# Patient Record
Sex: Female | Born: 1985 | Race: White | Hispanic: No | Marital: Married | Smoking: Former smoker
Health system: Southern US, Community
[De-identification: ages and names within clinical notes are randomized; demographics above are authoritative.]

## PROBLEM LIST (undated history)

## (undated) ENCOUNTER — Inpatient Hospital Stay (HOSPITAL_COMMUNITY): Payer: Self-pay

## (undated) DIAGNOSIS — J302 Other seasonal allergic rhinitis: Secondary | ICD-10-CM

## (undated) DIAGNOSIS — D649 Anemia, unspecified: Secondary | ICD-10-CM

## (undated) DIAGNOSIS — T8859XA Other complications of anesthesia, initial encounter: Secondary | ICD-10-CM

## (undated) DIAGNOSIS — T4145XA Adverse effect of unspecified anesthetic, initial encounter: Secondary | ICD-10-CM

## (undated) DIAGNOSIS — F988 Other specified behavioral and emotional disorders with onset usually occurring in childhood and adolescence: Secondary | ICD-10-CM

## (undated) DIAGNOSIS — O24410 Gestational diabetes mellitus in pregnancy, diet controlled: Secondary | ICD-10-CM

## (undated) DIAGNOSIS — O24419 Gestational diabetes mellitus in pregnancy, unspecified control: Secondary | ICD-10-CM

## (undated) DIAGNOSIS — L732 Hidradenitis suppurativa: Secondary | ICD-10-CM

## (undated) DIAGNOSIS — G5603 Carpal tunnel syndrome, bilateral upper limbs: Secondary | ICD-10-CM

## (undated) DIAGNOSIS — R309 Painful micturition, unspecified: Secondary | ICD-10-CM

## (undated) DIAGNOSIS — E282 Polycystic ovarian syndrome: Secondary | ICD-10-CM

## (undated) DIAGNOSIS — R87810 Cervical high risk human papillomavirus (HPV) DNA test positive: Secondary | ICD-10-CM

## (undated) DIAGNOSIS — G709 Myoneural disorder, unspecified: Secondary | ICD-10-CM

## (undated) DIAGNOSIS — E119 Type 2 diabetes mellitus without complications: Secondary | ICD-10-CM

## (undated) DIAGNOSIS — I10 Essential (primary) hypertension: Secondary | ICD-10-CM

## (undated) HISTORY — DX: Myoneural disorder, unspecified: G70.9

## (undated) HISTORY — DX: Other specified behavioral and emotional disorders with onset usually occurring in childhood and adolescence: F98.8

## (undated) HISTORY — DX: Gestational diabetes mellitus in pregnancy, diet controlled: O24.410

## (undated) HISTORY — PX: TONSILLECTOMY AND ADENOIDECTOMY: SHX28

## (undated) HISTORY — DX: Hidradenitis suppurativa: L73.2

## (undated) HISTORY — DX: Polycystic ovarian syndrome: E28.2

## (undated) HISTORY — DX: Anemia, unspecified: D64.9

## (undated) HISTORY — PX: WISDOM TOOTH EXTRACTION: SHX21

## (undated) HISTORY — DX: Cervical high risk human papillomavirus (HPV) DNA test positive: R87.810

## (undated) HISTORY — DX: Carpal tunnel syndrome, bilateral upper limbs: G56.03

## (undated) HISTORY — DX: Painful micturition, unspecified: R30.9

---

## 2009-03-13 ENCOUNTER — Emergency Department (HOSPITAL_COMMUNITY): Admission: EM | Admit: 2009-03-13 | Discharge: 2009-03-14 | Payer: Self-pay | Admitting: Emergency Medicine

## 2011-04-10 LAB — URINALYSIS, ROUTINE W REFLEX MICROSCOPIC
Nitrite: NEGATIVE
Protein, ur: NEGATIVE mg/dL
Specific Gravity, Urine: 1.02 (ref 1.005–1.030)
Urobilinogen, UA: 0.2 mg/dL (ref 0.0–1.0)

## 2011-04-10 LAB — DIFFERENTIAL
Basophils Absolute: 0 10*3/uL (ref 0.0–0.1)
Basophils Relative: 0 % (ref 0–1)
Lymphocytes Relative: 21 % (ref 12–46)
Neutro Abs: 8.9 10*3/uL — ABNORMAL HIGH (ref 1.7–7.7)
Neutrophils Relative %: 71 % (ref 43–77)

## 2011-04-10 LAB — BASIC METABOLIC PANEL
CO2: 25 mEq/L (ref 19–32)
Calcium: 9.6 mg/dL (ref 8.4–10.5)
Creatinine, Ser: 0.88 mg/dL (ref 0.4–1.2)
GFR calc Af Amer: >60 mL/min (ref >60)
GFR calc non Af Amer: >60 mL/min (ref >60)
Glucose, Bld: 175 mg/dL — ABNORMAL HIGH (ref 70–99)
Sodium: 138 mEq/L (ref 135–145)

## 2011-04-10 LAB — CBC
MCHC: 34.3 g/dL (ref 30.0–36.0)
RBC: 3.87 MIL/uL (ref 3.87–5.11)
RDW: 12.6 % (ref 11.5–15.5)

## 2014-11-01 ENCOUNTER — Emergency Department (HOSPITAL_COMMUNITY)
Admission: EM | Admit: 2014-11-01 | Discharge: 2014-11-01 | Disposition: A | Discharge status: Home / Self Care | Payer: Self-pay | Attending: Emergency Medicine | Admitting: Emergency Medicine

## 2014-11-01 ENCOUNTER — Encounter (HOSPITAL_COMMUNITY): Payer: Self-pay | Admitting: Emergency Medicine

## 2014-11-01 DIAGNOSIS — I10 Essential (primary) hypertension: Secondary | ICD-10-CM | POA: Insufficient documentation

## 2014-11-01 DIAGNOSIS — K0889 Other specified disorders of teeth and supporting structures: Secondary | ICD-10-CM

## 2014-11-01 DIAGNOSIS — M542 Cervicalgia: Secondary | ICD-10-CM | POA: Insufficient documentation

## 2014-11-01 DIAGNOSIS — K088 Other specified disorders of teeth and supporting structures: Secondary | ICD-10-CM | POA: Insufficient documentation

## 2014-11-01 DIAGNOSIS — Z720 Tobacco use: Secondary | ICD-10-CM | POA: Insufficient documentation

## 2014-11-01 HISTORY — DX: Essential (primary) hypertension: I10

## 2014-11-01 MED ORDER — IBUPROFEN 800 MG PO TABS: 800.0000 mg | ORAL_TABLET | Freq: Three times a day (TID) | ORAL | Status: DC

## 2014-11-01 MED ORDER — HYDROCODONE-ACETAMINOPHEN 5-325 MG PO TABS: 2.0000 | ORAL_TABLET | ORAL | Status: DC | PRN

## 2014-11-01 MED ORDER — AMOXICILLIN 500 MG PO CAPS: 500.0000 mg | ORAL_CAPSULE | Freq: Three times a day (TID) | ORAL | Status: DC

## 2014-11-01 NOTE — Discharge Instructions (Signed)

## 2014-11-01 NOTE — ED Notes (Signed)
Pt reports left sided mouth pain. Airway patent. nad noted.Pt denies any new or recent injury.

## 2014-11-01 NOTE — ED Provider Notes (Signed)
CSN: [id number]     Arrival date & time 11/01/14  1812 History   First MD Initiated Contact with Patient 11/01/14 1843     Chief Complaint  Patient presents with  . Dental Pain     (Consider location/radiation/quality/duration/timing/severity/associated sxs/prior Treatment) Patient is a 28 y.o. female presenting with tooth pain. The history is provided by the patient. No language interpreter was used.  Dental Pain Location:  Upper and lower Quality:  Aching and throbbing Severity:  Moderate Onset quality:  Gradual Duration:  4 days Timing:  Constant Progression:  Worsening Chronicity:  New Context: not abscess, not dental fracture and normal dentition   Relieved by:  NSAIDs Worsened by:  Jaw movement Ineffective treatments:  NSAIDs Associated symptoms: facial pain and neck pain   Associated symptoms: no oral bleeding and no oral lesions   patient complains of pain in the left side of her jaw she points to the area of the TMJ joint down the left side of her neck and left upper maxilla area. Patient reports it hurts to eat hurts to talk. Patient reports soreness in her teeth. She has not had any dental injury she has not had any signs of abscess. Patient reports that she did have a fever for the last 2 days at no night  Past Medical History  Diagnosis Date  . Hypertension    Past Surgical History  Procedure Laterality Date  . Cesarean section    . Tonsillectomy     History reviewed. No pertinent family history. History  Substance Use Topics  . Smoking status: Current Every Day Smoker -- 0.50 packs/day  . Smokeless tobacco: Not on file  . Alcohol Use: No   OB History    No data available     Review of Systems  HENT: Negative for mouth sores.   Musculoskeletal: Positive for neck pain.  All other systems reviewed and are negative.     Allergies  Review of patient's allergies indicates not on file.  Home Medications   Prior to Admission medications   Not on  File   BP 144/93 mmHg  Pulse 94  Temp(Src) 99.3 F (37.4 C) (Oral)  Resp 20  Ht 5\' 4"  (1.626 m)  Wt 225 lb (102.059 kg)  BMI 38.60 kg/m2  SpO2 100%  LMP 10/01/2014 Physical Exam  Constitutional: She is oriented to person, place, and time. She appears well-developed and well-nourished.  HENT:  Head: Normocephalic and atraumatic.  Right Ear: External ear normal.  Left Ear: External ear normal.  Mouth/Throat: Oropharyngeal exudate present.  Dental exam shows no sign of dental infection, dental abscess or cavity. TMJ joint is diffusely tender, patient has tenderness left neck at the curve of the mandible and down the area of the lymphatic system. No lymph nodes are noted  Eyes: Pupils are equal, round, and reactive to light.  Neck: Normal range of motion. Neck supple.  Cardiovascular: Normal rate.   Pulmonary/Chest: Effort normal.  Musculoskeletal: Normal range of motion.  Neurological: She is alert and oriented to person, place, and time. She has normal reflexes.  Skin: Skin is warm.  Nursing note and vitals reviewed.   ED Course  Procedures (including critical care time) Labs Review Labs Reviewed - No data to display  Imaging Review No results found.   EKG Interpretation None      MDM   Final diagnoses:  Toothache    Patient is advised to see her dentist for dental x-ray. I am not sure if  patient's pain is coming from a tooth or possible TMJ. No signs of sinusitis. I will treat patient with hydrocodone for pain ibuprofen for inflammation and give her a prescription for amoxicillin in case of possible dental abscess. I discussed with the patient possible etiologies of her pain and dentistry follow-up.    Elson AreasLeslie K Sofia, PA-C 11/01/14 2046  Flint MelterElliott L Wentz, MD 11/01/14 [phone]

## 2015-01-28 DIAGNOSIS — G5600 Carpal tunnel syndrome, unspecified upper limb: Secondary | ICD-10-CM | POA: Insufficient documentation

## 2015-02-07 ENCOUNTER — Telehealth: Payer: Self-pay | Admitting: *Deleted

## 2015-02-07 ENCOUNTER — Ambulatory Visit (INDEPENDENT_AMBULATORY_CARE_PROVIDER_SITE_OTHER): Payer: PRIVATE HEALTH INSURANCE | Admitting: Neurology

## 2015-02-07 ENCOUNTER — Ambulatory Visit (INDEPENDENT_AMBULATORY_CARE_PROVIDER_SITE_OTHER): Payer: Self-pay | Admitting: Neurology

## 2015-02-07 DIAGNOSIS — G5602 Carpal tunnel syndrome, left upper limb: Secondary | ICD-10-CM

## 2015-02-07 DIAGNOSIS — G5603 Carpal tunnel syndrome, bilateral upper limbs: Secondary | ICD-10-CM

## 2015-02-07 DIAGNOSIS — G5601 Carpal tunnel syndrome, right upper limb: Secondary | ICD-10-CM

## 2015-02-07 NOTE — Telephone Encounter (Signed)
Patient calling stating that she is running a little behind. She should not be but about 5 or 10 mins late.

## 2015-02-07 NOTE — Procedures (Signed)
  [facility]    Provider:  Dr Lucia GaskinsAhern Referring Provider: Roma KayserSkillman, Katie, PA-C Primary Care Physician:  Donzetta SprungANIEL, TERRY, MD  HPI:  Katelyn Avila is a 29 y.o. female here as a referral from Dr. Vernice JeffersonSkillman for bilateral numbness and tingling in the hands. Symptoms are in all the fingers, right > left. Her right hand continuously hurts. Her left hand hurts when driving and on the phone. Symptoms wake her at night and she tries to rub them or shake them to alleviate the numbness and pain.   Summary: Nerve Conduction studies were performed on the bilateral upper extremities.  The left median motor nerve showed prolonged distal onset latency (5.1 ms, N<4.3) with normal F wave latency.   The right median motor nerve showed prolonged distal onset latency (5.2 ms, N<4.3) with normal F wave latency.  The left Median 2nd Digit sensory nerve showed prolonged distal peak latency (5.1 ms, N<3.6)  The right Median 2nd Digit sensory nerve showed prolonged distal peak latency (5.3 ms, N<3.6)  The bilateral ADM Ulnar motor conductions were within normal limits with normal F wave latencies.  The bilateral 5th-digit Ulnar sensory conductions were within normal limits.   EMG needle evaluation was performed on selected right upper extremity muscles: The right Deltoid, right Triceps,  right Pronator Teres, right First Dorsal Interoseous, right Opponens Pollicis  muscles were normal. The EMG needle exam was terminated prematurely at patient's request due to pain.   Conclusion: This is a abnormal study. There is electrophysiologic evidence for moderately-severe right > left bilateral Carpal Tunnel Syndrome. No suggestion of cervical radiculopathy or polyneuropathy. Clinical correlation recommended.  Katelyn DeanAntonia Levonne Carreras, MD  [facility] [address] [place], [place] [id number]  Phone [phone] Fax [phone]

## 2015-02-07 NOTE — Progress Notes (Signed)
See procedure note.

## 2015-02-19 ENCOUNTER — Other Ambulatory Visit: Payer: Self-pay | Admitting: Orthopedic Surgery

## 2015-02-20 ENCOUNTER — Encounter (HOSPITAL_BASED_OUTPATIENT_CLINIC_OR_DEPARTMENT_OTHER): Payer: Self-pay | Admitting: *Deleted

## 2015-02-20 NOTE — Progress Notes (Signed)
   02/20/15 1138  OBSTRUCTIVE SLEEP APNEA  Have you ever been diagnosed with sleep apnea through a sleep study? No  Do you snore loudly (loud enough to be heard through closed doors)?  1  Do you often feel tired, fatigued, or sleepy during the daytime? 1  Has anyone observed you stop breathing during your sleep? 1  Do you have, or are you being treated for high blood pressure? 1  BMI more than 35 kg/m2? 1  Age over 10170 years old? 0  Gender: 0  Obstructive Sleep Apnea Score 5

## 2015-02-20 NOTE — Pre-Procedure Instructions (Signed)
[facility]; CMET done on 01/29/2015 [facility] [place], [place]

## 2015-02-22 ENCOUNTER — Ambulatory Visit (HOSPITAL_BASED_OUTPATIENT_CLINIC_OR_DEPARTMENT_OTHER): Payer: PRIVATE HEALTH INSURANCE | Admitting: Anesthesiology

## 2015-02-22 ENCOUNTER — Ambulatory Visit (HOSPITAL_BASED_OUTPATIENT_CLINIC_OR_DEPARTMENT_OTHER)
Admission: RE | Admit: 2015-02-22 | Discharge: 2015-02-22 | Disposition: A | Discharge status: Home / Self Care | Payer: PRIVATE HEALTH INSURANCE | Source: Ambulatory Visit | Attending: Orthopedic Surgery | Admitting: Orthopedic Surgery

## 2015-02-22 ENCOUNTER — Encounter (HOSPITAL_BASED_OUTPATIENT_CLINIC_OR_DEPARTMENT_OTHER)
Admission: RE | Disposition: A | Discharge status: Home / Self Care | Payer: Self-pay | Source: Ambulatory Visit | Attending: Orthopedic Surgery

## 2015-02-22 ENCOUNTER — Encounter (HOSPITAL_BASED_OUTPATIENT_CLINIC_OR_DEPARTMENT_OTHER): Payer: Self-pay | Admitting: *Deleted

## 2015-02-22 DIAGNOSIS — G5601 Carpal tunnel syndrome, right upper limb: Secondary | ICD-10-CM | POA: Insufficient documentation

## 2015-02-22 DIAGNOSIS — F17200 Nicotine dependence, unspecified, uncomplicated: Secondary | ICD-10-CM | POA: Diagnosis not present

## 2015-02-22 DIAGNOSIS — Z6841 Body Mass Index (BMI) 40.0 and over, adult: Secondary | ICD-10-CM | POA: Insufficient documentation

## 2015-02-22 DIAGNOSIS — I10 Essential (primary) hypertension: Secondary | ICD-10-CM | POA: Insufficient documentation

## 2015-02-22 HISTORY — DX: Other complications of anesthesia, initial encounter: T88.59XA

## 2015-02-22 HISTORY — DX: Other seasonal allergic rhinitis: J30.2

## 2015-02-22 HISTORY — DX: Adverse effect of unspecified anesthetic, initial encounter: T41.45XA

## 2015-02-22 HISTORY — PX: CARPAL TUNNEL RELEASE: SHX101

## 2015-02-22 SURGERY — CARPAL TUNNEL RELEASE
Anesthesia: General | Site: Wrist | Laterality: Right

## 2015-02-22 MED ORDER — CEFAZOLIN SODIUM-DEXTROSE 2-3 GM-% IV SOLR
INTRAVENOUS | Status: AC
  Filled 2015-02-22: qty 50

## 2015-02-22 MED ORDER — ONDANSETRON HCL 4 MG/2ML IJ SOLN
INTRAMUSCULAR | Status: DC | PRN
  Administered 2015-02-22: 4 mg via INTRAVENOUS

## 2015-02-22 MED ORDER — FENTANYL CITRATE 0.05 MG/ML IJ SOLN
25.0000 ug | INTRAMUSCULAR | Status: DC | PRN
  Administered 2015-02-22 (×2): 50 ug via INTRAVENOUS

## 2015-02-22 MED ORDER — BUPIVACAINE HCL (PF) 0.25 % IJ SOLN
INTRAMUSCULAR | Status: DC | PRN
  Administered 2015-02-22: 10 mL

## 2015-02-22 MED ORDER — KETOROLAC TROMETHAMINE 30 MG/ML IJ SOLN: 30.0000 mg | Freq: Once | INTRAMUSCULAR | Status: DC | PRN

## 2015-02-22 MED ORDER — FENTANYL CITRATE 0.05 MG/ML IJ SOLN: 50.0000 ug | INTRAMUSCULAR | Status: DC | PRN

## 2015-02-22 MED ORDER — MIDAZOLAM HCL 5 MG/5ML IJ SOLN
INTRAMUSCULAR | Status: DC | PRN
  Administered 2015-02-22: 2 mg via INTRAVENOUS

## 2015-02-22 MED ORDER — DEXAMETHASONE SODIUM PHOSPHATE 4 MG/ML IJ SOLN
INTRAMUSCULAR | Status: DC | PRN
  Administered 2015-02-22: 10 mg via INTRAVENOUS

## 2015-02-22 MED ORDER — FENTANYL CITRATE 0.05 MG/ML IJ SOLN
INTRAMUSCULAR | Status: DC | PRN
  Administered 2015-02-22: 100 ug via INTRAVENOUS

## 2015-02-22 MED ORDER — KETOROLAC TROMETHAMINE 30 MG/ML IJ SOLN
INTRAMUSCULAR | Status: DC | PRN
  Administered 2015-02-22: 30 mg via INTRAVENOUS

## 2015-02-22 MED ORDER — FENTANYL CITRATE 0.05 MG/ML IJ SOLN
INTRAMUSCULAR | Status: AC
  Filled 2015-02-22: qty 2

## 2015-02-22 MED ORDER — CHLORHEXIDINE GLUCONATE 4 % EX LIQD: 60.0000 mL | Freq: Once | CUTANEOUS | Status: DC

## 2015-02-22 MED ORDER — PROMETHAZINE HCL 25 MG/ML IJ SOLN: 6.2500 mg | INTRAMUSCULAR | Status: DC | PRN

## 2015-02-22 MED ORDER — MIDAZOLAM HCL 2 MG/2ML IJ SOLN: 1.0000 mg | INTRAMUSCULAR | Status: DC | PRN

## 2015-02-22 MED ORDER — OXYCODONE-ACETAMINOPHEN 5-325 MG PO TABS: ORAL_TABLET | ORAL | Status: DC

## 2015-02-22 MED ORDER — LACTATED RINGERS IV SOLN
INTRAVENOUS | Status: DC
Start: 2015-02-22 — End: 2015-02-22
  Administered 2015-02-22: 10:00:00 via INTRAVENOUS

## 2015-02-22 MED ORDER — FENTANYL CITRATE 0.05 MG/ML IJ SOLN
INTRAMUSCULAR | Status: AC
  Filled 2015-02-22: qty 4

## 2015-02-22 MED ORDER — MIDAZOLAM HCL 2 MG/2ML IJ SOLN
INTRAMUSCULAR | Status: AC
  Filled 2015-02-22: qty 2

## 2015-02-22 MED ORDER — CEFAZOLIN SODIUM-DEXTROSE 2-3 GM-% IV SOLR
2.0000 g | INTRAVENOUS | Status: AC
  Administered 2015-02-22: 2 g via INTRAVENOUS

## 2015-02-22 MED ORDER — MIDAZOLAM HCL 2 MG/ML PO SYRP: 12.0000 mg | ORAL_SOLUTION | Freq: Once | ORAL | Status: DC | PRN

## 2015-02-22 MED ORDER — PROPOFOL 10 MG/ML IV BOLUS
INTRAVENOUS | Status: DC | PRN
  Administered 2015-02-22: 200 mg via INTRAVENOUS

## 2015-02-22 SURGICAL SUPPLY — 40 items
BANDAGE ELASTIC 3 VELCRO ST LF (GAUZE/BANDAGES/DRESSINGS) ×3 IMPLANT
BLADE MINI RND TIP GREEN BEAV (BLADE) IMPLANT
BLADE SURG 15 STRL LF DISP TIS (BLADE) ×2 IMPLANT
BLADE SURG 15 STRL SS (BLADE) ×4
BNDG ESMARK 4X9 LF (GAUZE/BANDAGES/DRESSINGS) ×3 IMPLANT
BNDG GAUZE ELAST 4 BULKY (GAUZE/BANDAGES/DRESSINGS) ×3 IMPLANT
CHLORAPREP W/TINT 26ML (MISCELLANEOUS) ×3 IMPLANT
CORDS BIPOLAR (ELECTRODE) ×3 IMPLANT
COVER BACK TABLE 60X90IN (DRAPES) ×3 IMPLANT
COVER MAYO STAND STRL (DRAPES) ×3 IMPLANT
CUFF TOURNIQUET SINGLE 18IN (TOURNIQUET CUFF) ×3 IMPLANT
DRAPE EXTREMITY T 121X128X90 (DRAPE) ×3 IMPLANT
DRAPE SURG 17X23 STRL (DRAPES) ×3 IMPLANT
DRSG PAD ABDOMINAL 8X10 ST (GAUZE/BANDAGES/DRESSINGS) ×3 IMPLANT
GAUZE SPONGE 4X4 12PLY STRL (GAUZE/BANDAGES/DRESSINGS) ×3 IMPLANT
GAUZE XEROFORM 1X8 LF (GAUZE/BANDAGES/DRESSINGS) ×3 IMPLANT
GLOVE BIO SURGEON STRL SZ7.5 (GLOVE) ×3 IMPLANT
GLOVE BIOGEL PI IND STRL 6.5 (GLOVE) ×1 IMPLANT
GLOVE BIOGEL PI IND STRL 7.0 (GLOVE) ×1 IMPLANT
GLOVE BIOGEL PI IND STRL 8 (GLOVE) ×1 IMPLANT
GLOVE BIOGEL PI INDICATOR 6.5 (GLOVE) ×2
GLOVE BIOGEL PI INDICATOR 7.0 (GLOVE) ×2
GLOVE BIOGEL PI INDICATOR 8 (GLOVE) ×2
GLOVE EXAM NITRILE LRG STRL (GLOVE) ×3 IMPLANT
GLOVE SURG SS PI 6.5 STRL IVOR (GLOVE) ×3 IMPLANT
GLOVE SURG SS PI 7.0 STRL IVOR (GLOVE) ×3 IMPLANT
GOWN STRL REUS W/ TWL LRG LVL3 (GOWN DISPOSABLE) ×2 IMPLANT
GOWN STRL REUS W/TWL LRG LVL3 (GOWN DISPOSABLE) ×4
GOWN STRL REUS W/TWL XL LVL3 (GOWN DISPOSABLE) ×3 IMPLANT
NEEDLE HYPO 25X1 1.5 SAFETY (NEEDLE) ×3 IMPLANT
NS IRRIG 1000ML POUR BTL (IV SOLUTION) ×3 IMPLANT
PACK BASIN DAY SURGERY FS (CUSTOM PROCEDURE TRAY) ×3 IMPLANT
PADDING CAST ABS 4INX4YD NS (CAST SUPPLIES)
PADDING CAST ABS COTTON 4X4 ST (CAST SUPPLIES) IMPLANT
STOCKINETTE 4X48 STRL (DRAPES) ×3 IMPLANT
SUT ETHILON 4 0 PS 2 18 (SUTURE) ×3 IMPLANT
SYR BULB 3OZ (MISCELLANEOUS) ×3 IMPLANT
SYR CONTROL 10ML LL (SYRINGE) ×3 IMPLANT
TOWEL OR 17X24 6PK STRL BLUE (TOWEL DISPOSABLE) ×3 IMPLANT
UNDERPAD 30X30 INCONTINENT (UNDERPADS AND DIAPERS) IMPLANT

## 2015-02-22 NOTE — Anesthesia Procedure Notes (Signed)
Procedure Name: LMA Insertion Performed by: York GricePEARSON, Katelyn Avila Pre-anesthesia Checklist: Patient identified, Emergency Drugs available, Suction available and Patient being monitored Patient Re-evaluated:Patient Re-evaluated prior to inductionOxygen Delivery Method: Circle System Utilized Preoxygenation: Pre-oxygenation with 100% oxygen Intubation Type: IV induction Ventilation: Mask ventilation without difficulty LMA: LMA with gastric port inserted LMA Size: 4.0 Number of attempts: 1 Airway Equipment and Method: Bite block Placement Confirmation: positive ETCO2 Tube secured with: Tape Dental Injury: Teeth and Oropharynx as per pre-operative assessment

## 2015-02-22 NOTE — Transfer of Care (Signed)
Immediate Anesthesia Transfer of Care Note  Patient: Katelyn Avila  Procedure(s) Performed: Procedure(s): RIGHT CARPAL TUNNEL RELEASE (Right)  Patient Location: PACU  Anesthesia Type:General  Level of Consciousness: awake, alert  and oriented  Airway & Oxygen Therapy: Patient Spontanous Breathing and Patient connected to face mask oxygen  Post-op Assessment: Report given to RN and Post -op Vital signs reviewed and stable  Post vital signs: Reviewed and stable  Last Vitals:  Filed Vitals:   02/22/15 0956  BP: 115/82  Pulse: 79  Temp: 36.9 C  Resp: 20    Complications: No apparent anesthesia complications

## 2015-02-22 NOTE — Brief Op Note (Signed)
02/22/2015  11:48 AM  PATIENT:  Katelyn Avila  29 y.o. female  PRE-OPERATIVE DIAGNOSIS:  RIGHT CARPAL TUNNEL SYNDROME  POST-OPERATIVE DIAGNOSIS:  RIGHT CARPAL TUNNEL SYNDROME  PROCEDURE:  Procedure(s): RIGHT CARPAL TUNNEL RELEASE (Right)  SURGEON:  Surgeon(s) and Role:    * Betha LoaKevin Rafiq Bucklin, MD - Primary  PHYSICIAN ASSISTANT:   ASSISTANTS: none   ANESTHESIA:   general  EBL:  Total I/O In: 800 [I.V.:800] Out: -   BLOOD ADMINISTERED:none  DRAINS: none   LOCAL MEDICATIONS USED:  MARCAINE     SPECIMEN:  No Specimen  DISPOSITION OF SPECIMEN:  N/A  COUNTS:  YES  TOURNIQUET:   Total Tourniquet Time Documented: Upper Arm (Right) - 15 minutes Total: Upper Arm (Right) - 15 minutes   DICTATION: .Other Dictation: Dictation Number [id number]  PLAN OF CARE: Discharge to home after PACU  PATIENT DISPOSITION:  PACU - hemodynamically stable.

## 2015-02-22 NOTE — Op Note (Signed)
[id number] 

## 2015-02-22 NOTE — Anesthesia Postprocedure Evaluation (Signed)
  Anesthesia Post-op Note  Patient: Katelyn Avila  Procedure(s) Performed: Procedure(s) (LRB): RIGHT CARPAL TUNNEL RELEASE (Right)  Patient Location: PACU  Anesthesia Type: General  Level of Consciousness: awake and alert   Airway and Oxygen Therapy: Patient Spontanous Breathing  Post-op Pain: mild  Post-op Assessment: Post-op Vital signs reviewed, Patient's Cardiovascular Status Stable, Respiratory Function Stable, Patent Airway and No signs of Nausea or vomiting  Last Vitals:  Filed Vitals:   02/22/15 1245  BP: 99/57  Pulse: 68  Temp:   Resp: 17    Post-op Vital Signs: stable   Complications: No apparent anesthesia complications

## 2015-02-22 NOTE — Discharge Instructions (Addendum)

## 2015-02-22 NOTE — Anesthesia Preprocedure Evaluation (Signed)
Anesthesia Evaluation  Patient identified by MRN, date of birth, ID band Patient awake    Reviewed: Allergy & Precautions, NPO status , Patient's Chart, lab work & pertinent test results  Airway Mallampati: II  TM Distance: >3 FB Neck ROM: Full    Dental no notable dental hx.    Pulmonary Current Smoker,  breath sounds clear to auscultation  Pulmonary exam normal + decreased breath sounds      Cardiovascular hypertension, Rhythm:Regular Rate:Normal     Neuro/Psych negative neurological ROS  negative psych ROS   GI/Hepatic negative GI ROS, Neg liver ROS,   Endo/Other  Morbid obesity  Renal/GU negative Renal ROS  negative genitourinary   Musculoskeletal negative musculoskeletal ROS (+)   Abdominal (+) + obese,   Peds negative pediatric ROS (+)  Hematology negative hematology ROS (+)   Anesthesia Other Findings   Reproductive/Obstetrics negative OB ROS                             Anesthesia Physical Anesthesia Plan  ASA: III  Anesthesia Plan: General   Post-op Pain Management:    Induction: Intravenous  Airway Management Planned: LMA  Additional Equipment:   Intra-op Plan:   Post-operative Plan: Extubation in OR  Informed Consent: I have reviewed the patients History and Physical, chart, labs and discussed the procedure including the risks, benefits and alternatives for the proposed anesthesia with the patient or authorized representative who has indicated his/her understanding and acceptance.   Dental advisory given  Plan Discussed with: CRNA and Surgeon  Anesthesia Plan Comments:         Anesthesia Quick Evaluation

## 2015-02-22 NOTE — H&P (Signed)
  Katelyn Avila is an 29 y.o. female.   Chief Complaint: right carpal tunnel syndrome HPI: 29 yo rhd female with tingling and numbness bilateral hands x 5 years or more.  Nocturnal symptoms nightly that wake her.  Positive nerve conduction studies.  She wishes to have a carpal tunnel release for management of symptoms.  Past Medical History  Diagnosis Date  . Hypertension     under control "most of the time"; has been on med. since 09/2014  . Carpal tunnel syndrome 01/2015    right  . Complication of anesthesia     states woke up during T & A  . Seasonal allergies     Past Surgical History  Procedure Laterality Date  . Cesarean section    . Wisdom tooth extraction    . Tonsillectomy and adenoidectomy      History reviewed. No pertinent family history. Social History:  reports that she has been smoking Cigarettes.  She has a 6.5 pack-year smoking history. She has never used smokeless tobacco. She reports that she drinks alcohol. She reports that she does not use illicit drugs.  Allergies: No Known Allergies  No prescriptions prior to admission    No results found for this or any previous visit (from the past 48 hour(s)).  No results found.   A comprehensive review of systems was negative except for: Eyes: positive for contacts/glasses  Height 5\' 5"  (1.651 m), weight 104.327 kg (230 lb), last menstrual period 02/17/2015.  General appearance: alert, cooperative and appears stated age Head: Normocephalic, without obvious abnormality, atraumatic Neck: supple, symmetrical, trachea midline Resp: clear to auscultation bilaterally Cardio: regular rate and rhythm GI: non tender Extremities: intact sensation and capillary refill all digits.  +epl/fpl/io.  no wounds. Pulses: 2+ and symmetric Skin: Skin color, texture, turgor normal. No rashes or lesions Neurologic: Grossly normal Incision/Wound:  none  Assessment/Plan Right carpal tunnel syndrome.  Non operative and operative  treatment options were discussed with the patient and patient wishes to proceed with operative treatment. Risks, benefits, and alternatives of surgery were discussed and the patient agrees with the plan of care.   Meleny Tregoning R 02/22/2015, 8:53 AM

## 2015-02-23 NOTE — Op Note (Signed)
NAMDuard Larsen:  Hosterman, Affie                ACCOUNT NO.:  [id number]  MEDICAL RECORD NO.:  [id number]  LOCATION:                                 FACILITY:  PHYSICIAN:  Betha LoaKevin Denay Pleitez, MD        DATE OF BIRTH:  [birth year]  DATE OF PROCEDURE:  02/22/2015 DATE OF DISCHARGE:                              OPERATIVE REPORT   PREOPERATIVE DIAGNOSIS:  Right carpal tunnel syndrome.  POSTOPERATIVE DIAGNOSIS:  Right carpal tunnel syndrome.  PROCEDURE:  Right carpal tunnel release.  SURGEON:  Betha LoaKevin Metha Kolasa, MD  ASSISTANT:  None.  ANESTHESIA:  General.  IV FLUIDS:  Per anesthesia flow sheet.  ESTIMATED BLOOD LOSS:  Minimal.  COMPLICATIONS:  None.  SPECIMENS:  None.  TOURNIQUET TIME:  13 minutes.  DISPOSITION:  Stable to PACU.  INDICATIONS:  Ms. Katelyn Avila is a 29 year old female who has had numbness and tingling in the digits.  This wakes her at night.  She has positive nerve conduction studies.  She wishes to have a right carpal tunnel release for management of her symptoms.  Risks, benefits, and alternatives of surgery were discussed including risk of blood loss, infection, damage to nerves, vessels, tendons, ligaments, bone; failure of surgery; need for additional surgery, complications with wound healing, continued pain, recurrence of carpal tunnel syndrome, and damage to motor branch.  She voiced understanding of these risks and elected to proceed.  OPERATIVE COURSE:  After being identified preoperatively by myself, the patient and I agreed upon procedure and site of procedure.  Surgical site was marked.  Risks, benefits, and alternatives of surgery were reviewed and she wished to proceed.  Surgical consent had been signed. She was given IV Ancef as preoperative antibiotic prophylaxis.  She was transported to the operating room and placed on the operative table in supine position with the right upper extremity on arm board.  General anesthesia was induced by anesthesiologist.  The right  upper extremity was prepped and draped in normal sterile orthopedic fashion.  Surgical pause was performed between surgeons, anesthesia, and operating room staff, and all were in agreement as to the patient, procedure, and site of procedure.  Tourniquet at the proximal aspect of the extremity was inflated to 250 mmHg after exsanguination of the limb with an Esmarch bandage.  An incision was made over the transverse carpal ligament and carried into subcutaneous tissues by spreading technique.  Bipolar electrocautery was used to obtain hemostasis.  The palmar fascia was incised sharply.  The transverse carpal ligament was identified and incised.  It was incised distally first.  Care was taken to ensure complete decompression distally.  It was then incised proximally. Scissors were used to split the distal aspect of the volar antebrachial fascia.  Finger was placed into the wound to ensure complete decompression, which was the case.  The wounds were explored.  The nerve was adherent to the radial leaflet.  Motor branch was identified and was intact.  No masses were noted.  The wound was copiously irrigated with sterile saline and closed with 4-0 nylon in a horizontal mattress fashion.  It was then injected with 10 mL of 0.25% plain Marcaine to aid in postoperative  analgesia.  It was dressed with sterile Xeroform, 4x4s, and ABD, and wrapped with Kerlix and Ace bandage.  Tourniquet was deflated at 15 minutes.  Fingertips were pink with brisk capillary refill after deflation of the tourniquet.  Operative drapes were broken down.  The patient was awoken from anesthesia safely.  She was transferred back to the stretcher and taken to PACU in stable condition. I will see her back in the office in 1 week for postoperative followup. I will give her Percocet 5/325, 1-2 p.o. q.6 hours p.r.n. pain, dispensed #30.     Betha Loa, MD     KK/MEDQ  D:  02/22/2015  T:  02/23/2015  Job:  [id number]

## 2015-02-26 ENCOUNTER — Encounter (HOSPITAL_BASED_OUTPATIENT_CLINIC_OR_DEPARTMENT_OTHER): Payer: Self-pay | Admitting: Orthopedic Surgery

## 2015-02-26 LAB — POCT HEMOGLOBIN-HEMACUE: HEMOGLOBIN: 12.5 g/dL (ref 12.0–15.0)

## 2015-07-23 ENCOUNTER — Encounter: Payer: Self-pay | Admitting: Adult Health

## 2015-07-23 ENCOUNTER — Ambulatory Visit (INDEPENDENT_AMBULATORY_CARE_PROVIDER_SITE_OTHER): Payer: PRIVATE HEALTH INSURANCE | Admitting: Adult Health

## 2015-07-23 ENCOUNTER — Other Ambulatory Visit (HOSPITAL_COMMUNITY)
Admission: RE | Admit: 2015-07-23 | Discharge: 2015-07-23 | Disposition: A | Discharge status: Home / Self Care | Payer: PRIVATE HEALTH INSURANCE | Source: Ambulatory Visit | Attending: Adult Health | Admitting: Adult Health

## 2015-07-23 VITALS — BP 132/86 | HR 76 | Ht 64.0 in | Wt 249.0 lb

## 2015-07-23 DIAGNOSIS — Z01419 Encounter for gynecological examination (general) (routine) without abnormal findings: Secondary | ICD-10-CM | POA: Diagnosis not present

## 2015-07-23 DIAGNOSIS — Z113 Encounter for screening for infections with a predominantly sexual mode of transmission: Secondary | ICD-10-CM | POA: Diagnosis present

## 2015-07-23 DIAGNOSIS — R309 Painful micturition, unspecified: Secondary | ICD-10-CM | POA: Diagnosis not present

## 2015-07-23 DIAGNOSIS — R87810 Cervical high risk human papillomavirus (HPV) DNA test positive: Secondary | ICD-10-CM | POA: Diagnosis present

## 2015-07-23 DIAGNOSIS — Z1151 Encounter for screening for human papillomavirus (HPV): Secondary | ICD-10-CM | POA: Diagnosis present

## 2015-07-23 DIAGNOSIS — Z319 Encounter for procreative management, unspecified: Secondary | ICD-10-CM

## 2015-07-23 DIAGNOSIS — L732 Hidradenitis suppurativa: Secondary | ICD-10-CM | POA: Insufficient documentation

## 2015-07-23 HISTORY — DX: Painful micturition, unspecified: R30.9

## 2015-07-23 HISTORY — DX: Hidradenitis suppurativa: L73.2

## 2015-07-23 LAB — POCT URINALYSIS DIPSTICK
Blood, UA: NEGATIVE
Glucose, UA: NEGATIVE
Leukocytes, UA: NEGATIVE
Nitrite, UA: NEGATIVE
PROTEIN UA: NEGATIVE

## 2015-07-23 MED ORDER — CITRANATAL 90 DHA 90-1 & 300 MG PO MISC: ORAL | Status: DC

## 2015-07-23 NOTE — Progress Notes (Signed)
Patient ID: Katelyn Avila, female   DOB: [birth year], 29 y.o.   MRN: [id number] History of Present Illness: Katelyn Avila is a 29 year old white female, married, new to this practice, in for well woman gyn exam and pap.   Current Medications, Allergies, Past Medical History, Past Surgical History, Family History and Social History were reviewed in [employer] record.     Review of Systems: Patient denies any daily headaches, hearing loss, fatigue, blurred vision, shortness of breath, chest pain, abdominal pain, problems with bowel movements,or intercourse. No joint pain or mood swings.She has history if irregular periods and PCOs.She has 1 son but desires another child.She says she has pain and pressure with urination and sometimes cramps in"butt".    Physical Exam:BP 132/86 mmHg  Pulse 76  Ht  (1.626 m)  Wt 249 lb (112.946 kg)  BMI 42.72 kg/m2  LMP 07/15/2015 (Approximate)urine negative General:  Well developed, well nourished, no acute distress Skin:  Warm and dry Neck:  Midline trachea, normal thyroid, good ROM, no lymphadenopathy,has increased hair on chin, she shaves Lungs; Clear to auscultation bilaterally Breast:  No dominant palpable mass, retraction, or nipple discharge Cardiovascular: Regular rate and rhythm Abdomen:  Soft, non tender, no hepatosplenomegaly.obese Pelvic:  External genitalia is normal in appearance, no lesions.Does have scarring from hidradenitis.  The vagina is normal in appearance. Urethra has no lesions or masses. The cervix is nulliparous with nabothian cyst at 1 o'clock.  Uterus is felt to be normal size, shape, and contour.  No adnexal masses or tenderness noted.Bladder is non tender, no masses felt. Extremities/musculoskeletal:  No swelling or varicosities noted, no clubbing or cyanosis Psych:  No mood changes, alert and cooperative,seems happy   Impression: Well woman gyn exam with pap Desires pregnancy Pain and pressure with  urination Hidradenitis    Plan: Rx citranatal 90DHA take 1 daily #30 with 11 refills Check progesterone level 08/04/15 Discussed timing of sex, decreasing cigarettes and trying to lose some weight, decrease sodas Physical in 1 year Mammogram at 38

## 2015-07-23 NOTE — Patient Instructions (Signed)
Get progesterone level 8/6 opens at 8 am Physical in  1year Mammogram at 38 Have sex every other day, day 7-24, pee before sex and lay 30 minutes after

## 2015-07-24 LAB — CYTOLOGY - PAP

## 2015-07-25 ENCOUNTER — Telehealth: Payer: Self-pay | Admitting: Adult Health

## 2015-07-25 ENCOUNTER — Encounter: Payer: Self-pay | Admitting: Adult Health

## 2015-07-25 DIAGNOSIS — R87810 Cervical high risk human papillomavirus (HPV) DNA test positive: Secondary | ICD-10-CM | POA: Insufficient documentation

## 2015-07-25 DIAGNOSIS — Z113 Encounter for screening for infections with a predominantly sexual mode of transmission: Secondary | ICD-10-CM

## 2015-07-25 HISTORY — DX: Cervical high risk human papillomavirus (HPV) DNA test positive: R87.810

## 2015-07-25 NOTE — Telephone Encounter (Signed)
Pt aware of normal pap with negative, GC/CHL but +HPV, will repeat pap in 1 year

## 2015-07-25 NOTE — Telephone Encounter (Signed)
Pt aware of +HPV wants test for HIV.RPR and HSV 2, will order

## 2015-07-28 LAB — HIV ANTIBODY (ROUTINE TESTING W REFLEX): HIV Screen 4th Generation wRfx: NONREACTIVE

## 2015-07-28 LAB — RPR: RPR Ser Ql: NONREACTIVE

## 2015-07-28 LAB — HSV 2 ANTIBODY, IGG: HSV 2 Glycoprotein G Ab, IgG: <0.91 index (ref 0.00–0.90)

## 2015-07-28 LAB — PROGESTERONE: Progesterone: 0.6 ng/mL

## 2015-07-30 ENCOUNTER — Telehealth: Payer: Self-pay | Admitting: Adult Health

## 2015-07-30 NOTE — Telephone Encounter (Signed)
Progesterone results of 0.6, per Cyril Mourning, NP pt needs an appt to discuss progesterone results and desired pregnancy. Pt given an appt on 08/07/2015 at 4 pm with Victorino Dike.

## 2015-08-07 ENCOUNTER — Ambulatory Visit (INDEPENDENT_AMBULATORY_CARE_PROVIDER_SITE_OTHER): Payer: PRIVATE HEALTH INSURANCE | Admitting: Adult Health

## 2015-08-07 ENCOUNTER — Encounter: Payer: Self-pay | Admitting: Adult Health

## 2015-08-07 VITALS — BP 110/70 | HR 80 | Ht 64.0 in | Wt 249.0 lb

## 2015-08-07 DIAGNOSIS — Z319 Encounter for procreative management, unspecified: Secondary | ICD-10-CM

## 2015-08-07 MED ORDER — CITRANATAL ASSURE 35-1 & 300 MG PO MISC: 1.0000 | Freq: Every day | ORAL | Status: DC

## 2015-08-07 MED ORDER — CLOMIPHENE CITRATE 50 MG PO TABS: 50.0000 mg | ORAL_TABLET | Freq: Every day | ORAL | Status: DC

## 2015-08-07 NOTE — Patient Instructions (Signed)
Call with  Period, will rx comid  Clomiphene tablets What is this medicine? CLOMIPHENE (KLOE mi feen) is a fertility drug that increases the chance of pregnancy. It helps women ovulate (produce a mature egg) during their cycle. This medicine may be used for other purposes; ask your health care provider or pharmacist if you have questions. COMMON BRAND NAME(S): Clomid, Serophene What should I tell my health care provider before I take this medicine? They need to know if you have any of these conditions: -adrenal gland disease -blood vessel disease or blood clots -cyst on the ovary -endometriosis -liver disease -ovarian cancer -pituitary gland disease -vaginal bleeding that has not been evaluated -an unusual or allergic reaction to clomiphene, other medicines, foods, dyes, or preservatives -pregnant (should not be used if you are already pregnant) -breast-feeding How should I use this medicine? Take this medicine by mouth with a glass of water. Follow the directions on the prescription label. Take exactly as directed for the exact number of days prescribed. Take your doses at regular intervals. Most women take this medicine for a 5 day period, but the length of treatment may be adjusted. Your doctor will give you a start date for this medication and will give you instructions on proper use. Do not take your medicine more often than directed. Talk to your pediatrician regarding the use of this medicine in children. Special care may be needed. Overdosage: If you think you have taken too much of this medicine contact a poison control center or emergency room at once. NOTE: This medicine is only for you. Do not share this medicine with others. What if I miss a dose? If you miss a dose, take it as soon as you can. If it is almost time for your next dose, take only that dose. Do not take double or extra doses. What may interact with this medicine? -herbal or dietary supplements, like blue cohosh,  black cohosh, chasteberry, or DHEA -prasterone This list may not describe all possible interactions. Give your health care provider a list of all the medicines, herbs, non-prescription drugs, or dietary supplements you use. Also tell them if you smoke, drink alcohol, or use illegal drugs. Some items may interact with your medicine. What should I watch for while using this medicine? Make sure you understand how and when to use this medicine. You need to know when you are ovulating and when to have sexual intercourse. This will increase the chance of a pregnancy. Visit your doctor or health care professional for regular checks on your progress. You may need tests to check the hormone levels in your blood or you may have to use home-urine tests to check for ovulation. Try to keep any appointments. Compared to other fertility treatments, this medicine does not greatly increase your chances of having multiple babies. An increased chance of having twins may occur in roughly 5 out of every 100 women who take this medication. Stop taking this medicine at once and contact your doctor or health care professional if you think you are pregnant. This medicine is not for long-term use. Most women that benefit from this medicine do so within the first three cycles (months). Your doctor or health care professional will monitor your condition. This medicine is usually used for a total of 6 cycles of treatment. You may get drowsy or dizzy. Do not drive, use machinery, or do anything that needs mental alertness until you know how this drug affects you. Do not stand or sit up  quickly. This reduces the risk of dizzy or fainting spells. Drinking alcoholic beverages or smoking tobacco may decrease your chance of becoming pregnant. Limit or stop alcohol and tobacco use during your fertility treatments. What side effects may I notice from receiving this medicine? Side effects that you should report to your doctor or health care  professional as soon as possible: -allergic reactions like skin rash, itching or hives, swelling of the face, lips, or tongue -breathing problems -changes in vision -fluid retention -nausea, vomiting -pelvic pain or bloating -severe abdominal pain -sudden weight gain Side effects that usually do not require medical attention (report to your doctor or health care professional if they continue or are bothersome): -breast discomfort -hot flashes -mild pelvic discomfort -mild nausea This list may not describe all possible side effects. Call your doctor for medical advice about side effects. You may report side effects to FDA at 1-800-FDA-1088. Where should I keep my medicine? Keep out of the reach of children. Store at room temperature between 15 and 30 degrees C (59 and 86 degrees F). Protect from heat, light, and moisture. Throw away any unused medicine after the expiration date. NOTE: This sheet is a summary. It may not cover all possible information. If you have questions about this medicine, talk to your doctor, pharmacist, or health care provider.  2015, Elsevier/Gold Standard. (2008-03-27 22:21:06) CLOMID INSTRUCTIONS  WHY USE IT? Clomid helps your ovaries to release eggs (ovulate).  HOW TO USE IT? Clomid is taken as a pill usually on days 5,6,7,8, & 9 of your cycle.  Day 1 is the first day of your period. The dose or duration may be changed to achieve ovulation.  Provera (progesterone) may first be used to bring on a period for some patients. The day of ovulation on Clomid is usually between cycle day 14 and 17.  Having sexual intercourse at least every other day between cycle day 13 and 18 will improve your chances of becoming pregnant during the Clomid cycle.  You may monitor your ovulation using basal body temperature charts or with ovulation kits.  If using the ovulation predictor kits, having intercourse the day of the surge and the two days following is recommended. If you get  your period, call when it starts for an appointment with your doctor, so that an exam may be done, and another Clomid cycle can be considered if appropriate. If you do not get a period by day 35 of the cycle, please get a blood pregnancy test.  If it is negative, speak to your doctor for instructions to bring on another period and to plan a follow-up appointment.  THINGS TO KNOW: If you get pregnant while using Clomid, your chance of twins is 7%m and triplets is less than 1%. Some studies have suggested the use of "fertility drugs" may increase your risk of ovarian cancers in the future.  It is unclear if these drugs increase the risk, or people who have problems with fertility are prone for these cancers.  If there is an actual risk, it is very low.  If you have a history of liver problems or ovarian cancer, it may be wise to avoid this medication.  SIDE EFFECTS:  The most common side effect is hot flashes (20%).  Breast tenderness, headaches, nausea, bloating may also occur at different times.  Less than 3/1,000 people have dryness or loss of hair.  Persistent ovarian cysts may form from the use of this medication.  Ovarian hyperstimulation syndrome is a  rare side effect at low doses.  Visual changes like flashes of light or blurring.

## 2015-08-07 NOTE — Progress Notes (Signed)
Subjective:     Patient ID: Katelyn Avila, female   DOB: 1986-03-31, 29 y.o.   MRN: [id number]  HPI [name] is a 29 year old white female in to discuss taking clomid, she has history of PCO and is on metformin and aldactone. She has 66 yo son from prior relationship and her husband has 62 yo daughter from prion relationship.  Review of Systems Patient denies any headaches, hearing loss, fatigue, blurred vision, shortness of breath, chest pain, abdominal pain, problems with bowel movements, urination, or intercourse. No joint pain or mood swings. Reviewed past medical,surgical, social and family history. Reviewed medications and allergies.     Objective:   Physical Exam BP 110/70 mmHg  Pulse 80  Ht  (1.626 m)  Wt 249 lb (112.946 kg)  BMI 42.72 kg/m2  LMP 07/15/2015 (Approximate)Pt had progesterone level of 0.6 on 7/29 and she wants to try clomid, discussed with her and her husband, using clomid and they want to try it.    Assessment:     Desires pregnancy    Plan:     Rx clomid 50 mg #5 take 1 days 3-7 of cycle Call with next period, will check progesterone day 21 Rx citra natal assure #60 take daily with 11 refills Review handout on clomid

## 2015-08-20 ENCOUNTER — Other Ambulatory Visit: Payer: Self-pay | Admitting: Adult Health

## 2015-08-20 ENCOUNTER — Telehealth: Payer: Self-pay | Admitting: Adult Health

## 2015-08-20 DIAGNOSIS — Z319 Encounter for procreative management, unspecified: Secondary | ICD-10-CM

## 2015-08-20 NOTE — Telephone Encounter (Signed)
Period started yesterday, start clomid in am check progesterone 9/10

## 2015-09-09 LAB — PROGESTERONE: PROGESTERONE: 1.3 ng/mL

## 2015-09-10 ENCOUNTER — Telehealth: Payer: Self-pay | Admitting: Adult Health

## 2015-09-10 MED ORDER — CLOMIPHENE CITRATE 50 MG PO TABS: 100.0000 mg | ORAL_TABLET | Freq: Every day | ORAL | Status: DC

## 2015-09-10 NOTE — Telephone Encounter (Signed)
Pt aware of progesterone level, will increase clomid to 100 mg to try with next cycle, call with period, so can check progesterone level day 21

## 2015-09-14 ENCOUNTER — Telehealth: Payer: Self-pay | Admitting: Adult Health

## 2015-09-14 DIAGNOSIS — Z319 Encounter for procreative management, unspecified: Secondary | ICD-10-CM

## 2015-09-14 NOTE — Telephone Encounter (Signed)
Pt started period today, ck progesterone level 10/6

## 2015-09-14 NOTE — Telephone Encounter (Signed)
Spoke with pt. Pt started her period today. Wanted to let you know. JSY

## 2015-10-02 ENCOUNTER — Encounter: Payer: Self-pay | Admitting: Women's Health

## 2015-10-02 ENCOUNTER — Ambulatory Visit (INDEPENDENT_AMBULATORY_CARE_PROVIDER_SITE_OTHER): Payer: PRIVATE HEALTH INSURANCE | Admitting: Women's Health

## 2015-10-02 VITALS — BP 120/78 | HR 84 | Wt 255.0 lb

## 2015-10-02 DIAGNOSIS — R102 Pelvic and perineal pain: Secondary | ICD-10-CM

## 2015-10-02 DIAGNOSIS — E669 Obesity, unspecified: Secondary | ICD-10-CM

## 2015-10-02 DIAGNOSIS — N949 Unspecified condition associated with female genital organs and menstrual cycle: Secondary | ICD-10-CM

## 2015-10-02 DIAGNOSIS — Z3202 Encounter for pregnancy test, result negative: Secondary | ICD-10-CM | POA: Diagnosis not present

## 2015-10-02 HISTORY — DX: Obesity, unspecified: E66.9

## 2015-10-02 LAB — POCT URINE PREGNANCY: Preg Test, Ur: NEGATIVE

## 2015-10-02 NOTE — Progress Notes (Signed)
Patient ID: Katelyn Avila, female   DOB: 1986/07/02, 29 y.o.   MRN: [id number]   [facility] Visit  Patient name: Katelyn Avila MRN [id number]  Date of birth: 07-28-1986  CC & HPI:  Katelyn Avila is a 29 y.o. G1P1 Caucasian female presenting today for report of really bad cramps beginning this weekend- feel like are coming up from rectum into abdomen, ibuprofen  and apap not helping. On 2nd month of clomid, LMP 9/16, supposed to have progesterone draw 10/6.   Patient's last menstrual period was 09/14/2015.   Pertinent History Reviewed:  Medical & Surgical Hx:   Past Medical History  Diagnosis Date  . Hypertension     under control "most of the time"; has been on med. since 09/2014  . Complication of anesthesia     states woke up during T & A  . Seasonal allergies   . PCOS (polycystic ovarian syndrome)   . Neuromuscular disorder (HCC)   . Pain with urination 07/23/2015  . Hidradenitis 07/23/2015  . Cervical high risk HPV (human papillomavirus) test positive 07/25/2015  . Carpal tunnel syndrome, bilateral    Past Surgical History  Procedure Laterality Date  . Cesarean section    . Wisdom tooth extraction    . Tonsillectomy and adenoidectomy    . Carpal tunnel release Right 02/22/2015    Procedure: RIGHT CARPAL TUNNEL RELEASE;  Surgeon: Betha Loa, MD;  Location: [facility];  Service: Orthopedics;  Laterality: Right;   Medications: Reviewed & Updated - see associated section Social History: Reviewed -  reports that she has been smoking Cigarettes.  She has a 6.5 pack-year smoking history. She has never used smokeless tobacco.  Objective Findings:  Vitals: BP 120/78 mmHg  Pulse 84  Wt 115.667 kg (255 lb)  LMP 09/14/2015 Body mass index is 43.75 kg/(m^2).  Physical Examination: General appearance - alert, well appearing, and in no distress  No results found for this or any previous visit (from the past 24 hour(s)).   Assessment & Plan:  A:    Pelvic cramping midcycle, on 2nd month of clomid, likely ovulation pain  P:  Go home & have lots of sex  Progesterone as scheduled on 10/6 (day 21 of cycle)  Ibuprofen, heating pad, warm baths as needed  Return for As scheduled.  Marge Duncans CNM, [facility] 10/02/2015 4:28 PM

## 2015-10-02 NOTE — Patient Instructions (Signed)
If you are trying to get pregnant:  Have sex every other day on days 7-24 of your cycle (Day 1 is the 1st day of your period) Pee before sex Lay with your hips elevated on pillows for 20-30mins after sex Do not smoke or drink alcohol Lose weight if you are overweight Take a prenatal vitamin with at least 400mcg of folic acid Decrease stress in your life  For Him:  Wear boxers instead of briefs Avoid hot baths/jacuzzi Vit C supplement Do not smoke or drink alcohol Lose weight if you are overweight    

## 2015-10-02 NOTE — Addendum Note (Signed)
Addended by: Gaylyn Rong A on: 10/02/2015 04:46 PM   Modules accepted: Orders

## 2015-10-05 LAB — PROGESTERONE: PROGESTERONE: 10.2 ng/mL

## 2015-10-08 ENCOUNTER — Telehealth: Payer: Self-pay | Admitting: Adult Health

## 2015-10-08 NOTE — Telephone Encounter (Signed)
Pt aware progesterone level 10.2

## 2015-10-15 ENCOUNTER — Telehealth: Payer: Self-pay | Admitting: Adult Health

## 2015-10-15 NOTE — Telephone Encounter (Signed)
Pt's voicemail is not set up @ 12:29 pm. JSY

## 2015-10-16 MED ORDER — CLOMIPHENE CITRATE 50 MG PO TABS: 100.0000 mg | ORAL_TABLET | Freq: Every day | ORAL | Status: DC

## 2015-10-16 NOTE — Telephone Encounter (Signed)
Voicemail not set up @ 9:20 am. [employer]

## 2015-10-16 NOTE — Telephone Encounter (Signed)
Refilled clomid 

## 2015-10-16 NOTE — Telephone Encounter (Signed)
Spoke with pt. Pt started period Friday, she stopped bleeding Sunday, but started back bleeding yesterday and its light. She needs a refill on Clomid. Thanks! JSY

## 2015-10-22 DIAGNOSIS — I10 Essential (primary) hypertension: Secondary | ICD-10-CM | POA: Insufficient documentation

## 2016-06-04 DIAGNOSIS — G4709 Other insomnia: Secondary | ICD-10-CM | POA: Insufficient documentation

## 2016-06-04 DIAGNOSIS — R4587 Impulsiveness: Secondary | ICD-10-CM | POA: Insufficient documentation

## 2016-06-09 ENCOUNTER — Telehealth (HOSPITAL_COMMUNITY): Payer: Self-pay | Admitting: *Deleted

## 2016-06-09 NOTE — Telephone Encounter (Signed)
phone call, voice mail box not set up yet.

## 2016-07-07 DIAGNOSIS — R42 Dizziness and giddiness: Secondary | ICD-10-CM | POA: Insufficient documentation

## 2016-07-07 DIAGNOSIS — R002 Palpitations: Secondary | ICD-10-CM | POA: Insufficient documentation

## 2016-07-07 DIAGNOSIS — Z6839 Body mass index (BMI) 39.0-39.9, adult: Secondary | ICD-10-CM | POA: Insufficient documentation

## 2016-07-07 HISTORY — DX: Dizziness and giddiness: R42

## 2016-07-07 HISTORY — DX: Palpitations: R00.2

## 2016-07-11 ENCOUNTER — Encounter: Payer: Self-pay | Admitting: *Deleted

## 2016-07-11 ENCOUNTER — Telehealth: Payer: Self-pay | Admitting: *Deleted

## 2016-07-11 NOTE — Telephone Encounter (Signed)
Spoke with the pt and advised either flintstone vitamins or OTC prenatal vitamin would be fine. Pt verbalized understanding.

## 2016-07-14 ENCOUNTER — Ambulatory Visit: Payer: PRIVATE HEALTH INSURANCE | Admitting: Cardiovascular Disease

## 2016-07-23 ENCOUNTER — Other Ambulatory Visit: Payer: PRIVATE HEALTH INSURANCE | Admitting: Adult Health

## 2016-07-28 ENCOUNTER — Telehealth: Payer: Self-pay | Admitting: Obstetrics & Gynecology

## 2016-07-28 NOTE — Telephone Encounter (Signed)
Pt called stating that she would like a doctor to call in something for her nausea, Pt states that she is also experiencing headaches. Pt has a Dating u/s on 07/31/2016. Please contact pt

## 2016-07-29 ENCOUNTER — Ambulatory Visit: Payer: PRIVATE HEALTH INSURANCE | Admitting: Obstetrics and Gynecology

## 2016-07-29 ENCOUNTER — Telehealth: Payer: Self-pay | Admitting: Women's Health

## 2016-07-29 MED ORDER — DOXYLAMINE-PYRIDOXINE 10-10 MG PO TBEC: 10.0000 mg | DELAYED_RELEASE_TABLET | ORAL | 0 refills | Status: DC

## 2016-07-29 NOTE — Telephone Encounter (Addendum)
Pt states initial ultrasound appt on 07/31/2016, c/o dizziness, nausea and vomiting,  Headaches x 3 days( no improvement with Tylenol). Headaches so bad she has to go to bed. Pt states she has been trying to eat and drink to help with dizziness.  Pt states takes blood pressure medication, Lisinopril. Concerned about blood pressure. Pt states had to be induced her last pregnancy due to elevated blood pressures.   Pt given an appt today for evaluation.   Pt called back and states she was able to check her blood pressure 130/77, P 90.  Pt also states does not feel she is going to be able to make it to the appt today. Pt encouraged to keep hydrated and eat frequent snacks/meals. Continue to monitor blood pressure.  Diclegis samples x 2 left at front desk for nausea/vomiting. Pt to keep her appt for 07/31/2016 for ultrasound.

## 2016-07-29 NOTE — Addendum Note (Signed)
Addended by: Criss Alvine on: 07/29/2016 10:14 AM   Modules accepted: Orders

## 2016-07-30 ENCOUNTER — Other Ambulatory Visit: Payer: Self-pay | Admitting: Obstetrics and Gynecology

## 2016-07-30 DIAGNOSIS — O3680X0 Pregnancy with inconclusive fetal viability, not applicable or unspecified: Secondary | ICD-10-CM

## 2016-07-31 ENCOUNTER — Ambulatory Visit (INDEPENDENT_AMBULATORY_CARE_PROVIDER_SITE_OTHER): Payer: PRIVATE HEALTH INSURANCE

## 2016-07-31 DIAGNOSIS — Z3A01 Less than 8 weeks gestation of pregnancy: Secondary | ICD-10-CM | POA: Diagnosis not present

## 2016-07-31 DIAGNOSIS — O3680X0 Pregnancy with inconclusive fetal viability, not applicable or unspecified: Secondary | ICD-10-CM | POA: Diagnosis not present

## 2016-07-31 NOTE — Progress Notes (Signed)
[place] 6+3 wks,single IUP w/ys,pos.fht 127 bpm,normal ov's bilat,crl 7.0 mm

## 2016-08-08 ENCOUNTER — Telehealth: Payer: Self-pay | Admitting: Advanced Practice Midwife

## 2016-08-11 MED ORDER — DOXYLAMINE-PYRIDOXINE 10-10 MG PO TBEC: DELAYED_RELEASE_TABLET | ORAL | 6 refills | Status: DC

## 2016-08-11 NOTE — Telephone Encounter (Signed)
Pt requesting Rx for Diclegis, Zofran not helping.  Pt states her insurance is [employer].

## 2016-08-12 ENCOUNTER — Other Ambulatory Visit (HOSPITAL_COMMUNITY)
Admission: RE | Admit: 2016-08-12 | Discharge: 2016-08-12 | Disposition: A | Discharge status: Home / Self Care | Payer: PRIVATE HEALTH INSURANCE | Source: Ambulatory Visit | Attending: Obstetrics & Gynecology | Admitting: Obstetrics & Gynecology

## 2016-08-12 ENCOUNTER — Encounter: Payer: Self-pay | Admitting: Women's Health

## 2016-08-12 ENCOUNTER — Ambulatory Visit (INDEPENDENT_AMBULATORY_CARE_PROVIDER_SITE_OTHER): Payer: PRIVATE HEALTH INSURANCE | Admitting: Women's Health

## 2016-08-12 VITALS — BP 138/78 | HR 76 | Wt 260.0 lb

## 2016-08-12 DIAGNOSIS — Z1389 Encounter for screening for other disorder: Secondary | ICD-10-CM

## 2016-08-12 DIAGNOSIS — Z113 Encounter for screening for infections with a predominantly sexual mode of transmission: Secondary | ICD-10-CM | POA: Insufficient documentation

## 2016-08-12 DIAGNOSIS — O0990 Supervision of high risk pregnancy, unspecified, unspecified trimester: Secondary | ICD-10-CM | POA: Insufficient documentation

## 2016-08-12 DIAGNOSIS — Z3A09 9 weeks gestation of pregnancy: Secondary | ICD-10-CM

## 2016-08-12 DIAGNOSIS — Z8632 Personal history of gestational diabetes: Secondary | ICD-10-CM

## 2016-08-12 DIAGNOSIS — Z98891 History of uterine scar from previous surgery: Secondary | ICD-10-CM | POA: Insufficient documentation

## 2016-08-12 DIAGNOSIS — O10912 Unspecified pre-existing hypertension complicating pregnancy, second trimester: Secondary | ICD-10-CM

## 2016-08-12 DIAGNOSIS — O09291 Supervision of pregnancy with other poor reproductive or obstetric history, first trimester: Secondary | ICD-10-CM

## 2016-08-12 DIAGNOSIS — O10919 Unspecified pre-existing hypertension complicating pregnancy, unspecified trimester: Secondary | ICD-10-CM

## 2016-08-12 DIAGNOSIS — Z124 Encounter for screening for malignant neoplasm of cervix: Secondary | ICD-10-CM

## 2016-08-12 DIAGNOSIS — O09299 Supervision of pregnancy with other poor reproductive or obstetric history, unspecified trimester: Secondary | ICD-10-CM | POA: Insufficient documentation

## 2016-08-12 DIAGNOSIS — Z369 Encounter for antenatal screening, unspecified: Secondary | ICD-10-CM

## 2016-08-12 DIAGNOSIS — Z0283 Encounter for blood-alcohol and blood-drug test: Secondary | ICD-10-CM

## 2016-08-12 DIAGNOSIS — Z1151 Encounter for screening for human papillomavirus (HPV): Secondary | ICD-10-CM | POA: Diagnosis present

## 2016-08-12 DIAGNOSIS — I10 Essential (primary) hypertension: Secondary | ICD-10-CM | POA: Insufficient documentation

## 2016-08-12 DIAGNOSIS — O09891 Supervision of other high risk pregnancies, first trimester: Secondary | ICD-10-CM

## 2016-08-12 DIAGNOSIS — E282 Polycystic ovarian syndrome: Secondary | ICD-10-CM | POA: Insufficient documentation

## 2016-08-12 DIAGNOSIS — Z331 Pregnant state, incidental: Secondary | ICD-10-CM

## 2016-08-12 DIAGNOSIS — O34219 Maternal care for unspecified type scar from previous cesarean delivery: Secondary | ICD-10-CM

## 2016-08-12 DIAGNOSIS — Z01419 Encounter for gynecological examination (general) (routine) without abnormal findings: Secondary | ICD-10-CM | POA: Insufficient documentation

## 2016-08-12 DIAGNOSIS — Z3682 Encounter for antenatal screening for nuchal translucency: Secondary | ICD-10-CM

## 2016-08-12 LAB — POCT URINALYSIS DIPSTICK
Blood, UA: NEGATIVE
GLUCOSE UA: NEGATIVE
Ketones, UA: NEGATIVE
LEUKOCYTES UA: NEGATIVE
NITRITE UA: NEGATIVE
Protein, UA: NEGATIVE

## 2016-08-12 MED ORDER — LABETALOL HCL 200 MG PO TABS: 200.0000 mg | ORAL_TABLET | Freq: Two times a day (BID) | ORAL | 3 refills | Status: DC

## 2016-08-12 NOTE — Progress Notes (Signed)
Subjective:  Katelyn Avila is a 30 y.o. 702P0101 Caucasian female at 5559w1d by 6wk u/s, being seen today for her first obstetrical visit.  Her obstetrical history is significant for 36wk IOL d/t worsening CHTN- failed IOL w/ c/s @ 4cm, states they discovered she had HTN early in 1st pregnancy and has been on meds ever since- has been taking lisinopril/hctz, had A2DM-insulin during that pregnancy as well.  Pregnancy history fully reviewed.  Patient reports n/v- tried diclegis samples which helped, i sent in rx for diclegis yesterday when she called but states she was unaware of this, had called nurse line last week and was rx'd 6 pills of zofran- had taken 3. Denies vb, cramping, uti s/s, abnormal/malodorous vag d/c, or vulvovaginal itching/irritation.  BP 138/78   Pulse 76   Wt 260 lb (117.9 kg)   LMP  (LMP Unknown)   BMI 44.63 kg/m   HISTORY: OB History  Gravida Para Term Preterm AB Living  2 1   1   1   SAB TAB Ectopic Multiple Live Births          1    # Outcome Date GA Lbr Len/2nd Weight Sex Delivery Anes PTL Lv  2 Current           1 Preterm 10/19/09 3483w0d  6 lb 3 oz (2.807 kg) M CS-Unspec  N LIV     Past Medical History:  Diagnosis Date  . Carpal tunnel syndrome, bilateral   . Cervical high risk HPV (human papillomavirus) test positive 07/25/2015  . Complication of anesthesia    states woke up during T & A  . Hidradenitis 07/23/2015  . Hypertension    under control "most of the time"; has been on med. since 09/2014  . Neuromuscular disorder (HCC)   . Pain with urination 07/23/2015  . PCOS (polycystic ovarian syndrome)   . Seasonal allergies    Past Surgical History:  Procedure Laterality Date  . CARPAL TUNNEL RELEASE Right 02/22/2015   Procedure: RIGHT CARPAL TUNNEL RELEASE;  Surgeon: Betha LoaKevin Kuzma, MD;  Location: [facility];  Service: Orthopedics;  Laterality: Right;  . CESAREAN SECTION    . TONSILLECTOMY AND ADENOIDECTOMY    . WISDOM TOOTH EXTRACTION      Family History  Problem Relation Age of Onset  . Fibroids Mother   . Hypertension Mother   . Endometriosis Mother   . Hypertension Father   . Hypertension Brother   . Hypertension Maternal Grandmother   . Diabetes Maternal Grandmother   . COPD Maternal Grandmother   . Hypertension Maternal Grandfather   . Hypertension Paternal Grandmother   . Cancer Paternal Grandmother     breast  . Hypertension Paternal Grandfather     Exam   System:     General: Well developed & nourished, no acute distress   Skin: Warm & dry, normal coloration and turgor, no rashes   Neurologic: Alert & oriented, normal mood   Cardiovascular: Regular rate & rhythm   Respiratory: Effort & rate normal, LCTAB, acyanotic   Abdomen: Soft, non tender   Extremities: normal strength, tone   Pelvic Exam:    Perineum: Normal perineum   Vulva: Normal, no lesions   Vagina:  Normal mucosa, normal discharge   Cervix: Normal, bulbous, appears closed   Uterus: Normal size/shape/contour for GA   Thin prep pap smear obtained today  FHR: + via informal u/s   Assessment:   Pregnancy: G2P0101 Patient Active Problem List   Diagnosis  Date Noted  . Supervision of other high-risk pregnancy 08/12/2016    Priority: High  . Chronic hypertension during pregnancy, antepartum 08/12/2016  . Previous cesarean section complicating pregnancy 08/12/2016  . PCOS (polycystic ovarian syndrome) 08/12/2016  . History of gestational diabetes in prior pregnancy, currently pregnant 08/12/2016  . Cervical high risk HPV (human papillomavirus) test positive 07/25/2015  . 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  Plan:  Initial labs drawn including CMP and 24hr urine Continue prenatal vitamins Stop lisinopril/hctz immediately, rx labetalol 200mg  BID Problem list reviewed and updated Reviewed n/v relief  measures and warning s/s to report, let us know if has problems getting diclegis filled, no samples available today, gave printed otc vit b6/unisom directions, can let us know if she prefers rx for phenergan Reviewed recommended weight gain based on pre-gravid BMI Encouraged well-balanced diet Genetic Screening discussed Integrated Screen: requested Cystic fibrosis screening discussed known carrier, fob wants to be tested- wrote order on rx pad for him today to take to lab Ultrasound discussed; fetal survey: requested Follow up this week for early 2hr gtt (no visit), then 2wks for HROB to see how bp is doing w/ med change, then 4wks from now for HROB and 1st IT/NT CCNC completed Sign consent [facility] Interested in VBAC, discussed risks/benefits, gave consent to take home and review  Marge DuncansBooker, Basem Yannuzzi Randall CNM, [facility] 08/12/2016 4:38 PM

## 2016-08-12 NOTE — Patient Instructions (Signed)
Begin taking a 81mg  baby aspirin daily at 12 weeks of pregnancy (9/11) to decrease risk of preeclampsia during pregnancy   You will have your sugar test next visit.  Please do not eat or drink anything after midnight the night before you come, not even water.  You will be here for at least two hours.      Vitamin B6 (pyridoxine) 25mg  four times a day OR 50mg  twice a day Add Unisom (doxylamine) 12.5mg  (1/2 tab) in the morning, 12.5mg  6-8 hours later, then 25mg  every night if symptoms do not improve with Vitamin B6 alone   Nausea & Vomiting  Have saltine crackers or pretzels by your bed and eat a few bites before you raise your head out of bed in the morning  Eat small frequent meals throughout the day instead of large meals  Drink plenty of fluids throughout the day to stay hydrated, just don't drink a lot of fluids with your meals.  This can make your stomach fill up faster making you feel sick  Do not brush your teeth right after you eat  Products with real ginger are good for nausea, like ginger ale and ginger hard candy Make sure it says made with real ginger!  Sucking on sour candy like lemon heads is also good for nausea  If your prenatal vitamins make you nauseated, take them at night so you will sleep through the nausea  Sea Bands  If you feel like you need medicine for the nausea & vomiting please let us know  If you are unable to keep any fluids or food down please let us know   Constipation  Drink plenty of fluid, preferably water, throughout the day  Eat foods high in fiber such as fruits, vegetables, and grains  Exercise, such as walking, is a good way to keep your bowels regular  Drink warm fluids, especially warm prune juice, or decaf coffee  Eat a 1/2 cup of real oatmeal (not instant), 1/2 cup applesauce, and 1/2-1 cup warm prune juice every day  If needed, you may take Colace (docusate sodium) stool softener once or twice a day to help keep the stool soft. If  you are pregnant, wait until you are out of your first trimester (12-14 weeks of pregnancy)  If you still are having problems with constipation, you may take Miralax once daily as needed to help keep your bowels regular.  If you are pregnant, wait until you are out of your first trimester (12-14 weeks of pregnancy)   First Trimester of Pregnancy The first trimester of pregnancy is from week 1 until the end of week 12 (months 1 through 3). A week after a sperm fertilizes an egg, the egg will implant on the wall of the uterus. This embryo will begin to develop into a baby. Genes from you and your partner are forming the baby. The female genes determine whether the baby is a boy or a girl. At 6-8 weeks, the eyes and face are formed, and the heartbeat can be seen on ultrasound. At the end of 12 weeks, all the baby's organs are formed.  Now that you are pregnant, you will want to do everything you can to have a healthy baby. Two of the most important things are to get good prenatal care and to follow your health care provider's instructions. Prenatal care is all the medical care you receive before the baby's birth. This care will help prevent, find, and treat any problems during the  pregnancy and childbirth. BODY CHANGES Your body goes through many changes during pregnancy. The changes vary from woman to woman.   You may gain or lose a couple of pounds at first.  You may feel sick to your stomach (nauseous) and throw up (vomit). If the vomiting is uncontrollable, call your health care provider.  You may tire easily.  You may develop headaches that can be relieved by medicines approved by your health care provider.  You may urinate more often. Painful urination may mean you have a bladder infection.  You may develop heartburn as a result of your pregnancy.  You may develop constipation because certain hormones are causing the muscles that push waste through your intestines to slow down.  You may  develop hemorrhoids or swollen, bulging veins (varicose veins).  Your breasts may begin to grow larger and become tender. Your nipples may stick out more, and the tissue that surrounds them (areola) may become darker.  Your gums may bleed and may be sensitive to brushing and flossing.  Dark spots or blotches (chloasma, mask of pregnancy) may develop on your face. This will likely fade after the baby is born.  Your menstrual periods will stop.  You may have a loss of appetite.  You may develop cravings for certain kinds of food.  You may have changes in your emotions from day to day, such as being excited to be pregnant or being concerned that something may go wrong with the pregnancy and baby.  You may have more vivid and strange dreams.  You may have changes in your hair. These can include thickening of your hair, rapid growth, and changes in texture. Some women also have hair loss during or after pregnancy, or hair that feels dry or thin. Your hair will most likely return to normal after your baby is born. WHAT TO EXPECT AT YOUR PRENATAL VISITS During a routine prenatal visit:  You will be weighed to make sure you and the baby are growing normally.  Your blood pressure will be taken.  Your abdomen will be measured to track your baby's growth.  The fetal heartbeat will be listened to starting around week 10 or 12 of your pregnancy.  Test results from any previous visits will be discussed. Your health care provider may ask you:  How you are feeling.  If you are feeling the baby move.  If you have had any abnormal symptoms, such as leaking fluid, bleeding, severe headaches, or abdominal cramping.  If you have any questions. Other tests that may be performed during your first trimester include:  Blood tests to find your blood type and to check for the presence of any previous infections. They will also be used to check for low iron levels (anemia) and Rh antibodies. Later in  the pregnancy, blood tests for diabetes will be done along with other tests if problems develop.  Urine tests to check for infections, diabetes, or protein in the urine.  An ultrasound to confirm the proper growth and development of the baby.  An amniocentesis to check for possible genetic problems.  Fetal screens for spina bifida and Down syndrome.  You may need other tests to make sure you and the baby are doing well. HOME CARE INSTRUCTIONS  Medicines  Follow your health care provider's instructions regarding medicine use. Specific medicines may be either safe or unsafe to take during pregnancy.  Take your prenatal vitamins as directed.  If you develop constipation, try taking a stool softener if  your health care provider approves. Diet  Eat regular, well-balanced meals. Choose a variety of foods, such as meat or vegetable-based protein, fish, milk and low-fat dairy products, vegetables, fruits, and whole grain breads and cereals. Your health care provider will help you determine the amount of weight gain that is right for you.  Avoid raw meat and uncooked cheese. These carry germs that can cause birth defects in the baby.  Eating four or five small meals rather than three large meals a day may help relieve nausea and vomiting. If you start to feel nauseous, eating a few soda crackers can be helpful. Drinking liquids between meals instead of during meals also seems to help nausea and vomiting.  If you develop constipation, eat more high-fiber foods, such as fresh vegetables or fruit and whole grains. Drink enough fluids to keep your urine clear or pale yellow. Activity and Exercise  Exercise only as directed by your health care provider. Exercising will help you:  Control your weight.  Stay in shape.  Be prepared for labor and delivery.  Experiencing pain or cramping in the lower abdomen or low back is a good sign that you should stop exercising. Check with your health care  provider before continuing normal exercises.  Try to avoid standing for long periods of time. Move your legs often if you must stand in one place for a long time.  Avoid heavy lifting.  Wear low-heeled shoes, and practice good posture.  You may continue to have sex unless your health care provider directs you otherwise. Relief of Pain or Discomfort  Wear a good support bra for breast tenderness.   Take warm sitz baths to soothe any pain or discomfort caused by hemorrhoids. Use hemorrhoid cream if your health care provider approves.   Rest with your legs elevated if you have leg cramps or low back pain.  If you develop varicose veins in your legs, wear support hose. Elevate your feet for 15 minutes, 3-4 times a day. Limit salt in your diet. Prenatal Care  Schedule your prenatal visits by the twelfth week of pregnancy. They are usually scheduled monthly at first, then more often in the last 2 months before delivery.  Write down your questions. Take them to your prenatal visits.  Keep all your prenatal visits as directed by your health care provider. Safety  Wear your seat belt at all times when driving.  Make a list of emergency phone numbers, including numbers for family, friends, the hospital, and police and fire departments. General Tips  Ask your health care provider for a referral to a local prenatal education class. Begin classes no later than at the beginning of month 6 of your pregnancy.  Ask for help if you have counseling or nutritional needs during pregnancy. Your health care provider can offer advice or refer you to specialists for help with various needs.  Do not use hot tubs, steam rooms, or saunas.  Do not douche or use tampons or scented sanitary pads.  Do not cross your legs for long periods of time.  Avoid cat litter boxes and soil used by cats. These carry germs that can cause birth defects in the baby and possibly loss of the fetus by miscarriage or  stillbirth.  Avoid all smoking, herbs, alcohol, and medicines not prescribed by your health care provider. Chemicals in these affect the formation and growth of the baby.  Schedule a dentist appointment. At home, brush your teeth with a soft toothbrush and be gentle  when you floss. SEEK MEDICAL CARE IF:   You have dizziness.  You have mild pelvic cramps, pelvic pressure, or nagging pain in the abdominal area.  You have persistent nausea, vomiting, or diarrhea.  You have a bad smelling vaginal discharge.  You have pain with urination.  You notice increased swelling in your face, hands, legs, or ankles. SEEK IMMEDIATE MEDICAL CARE IF:   You have a fever.  You are leaking fluid from your vagina.  You have spotting or bleeding from your vagina.  You have severe abdominal cramping or pain.  You have rapid weight gain or loss.  You vomit blood or material that looks like coffee grounds.  You are exposed to [place] measles and have never had them.  You are exposed to fifth disease or chickenpox.  You develop a severe headache.  You have shortness of breath.  You have any kind of trauma, such as from a fall or a car accident. Document Released: 12/09/2001 Document Revised: 05/01/2014 Document Reviewed: 10/25/2013 [facility] Patient Information 2015 [place], [place]. This information is not intended to replace advice given to you by your health care provider. Make sure you discuss any questions you have with your health care provider.

## 2016-08-13 LAB — PMP SCREEN PROFILE (10S), URINE
Amphetamine Screen, Ur: NEGATIVE ng/mL
BARBITURATE SCRN UR: NEGATIVE ng/mL
Benzodiazepine Screen, Urine: NEGATIVE ng/mL
COCAINE(METAB.) SCREEN, URINE: NEGATIVE ng/mL
Cannabinoids Ur Ql Scn: NEGATIVE ng/mL
Creatinine(Crt), U: 213.8 mg/dL (ref 20.0–300.0)
METHADONE SCREEN, URINE: NEGATIVE ng/mL
OPIATE SCRN UR: NEGATIVE ng/mL
OXYCODONE+OXYMORPHONE UR QL SCN: NEGATIVE ng/mL
PCP Scrn, Ur: NEGATIVE ng/mL
PROPOXYPHENE SCREEN: NEGATIVE ng/mL
Ph of Urine: 6.5 (ref 4.5–8.9)

## 2016-08-13 LAB — CBC
HEMATOCRIT: 34.3 % (ref 34.0–46.6)
Hemoglobin: 11.2 g/dL (ref 11.1–15.9)
MCH: 31.5 pg (ref 26.6–33.0)
MCHC: 32.7 g/dL (ref 31.5–35.7)
MCV: 96 fL (ref 79–97)
Platelets: 311 10*3/uL (ref 150–379)
RBC: 3.56 x10E6/uL — AB (ref 3.77–5.28)
RDW: 13.5 % (ref 12.3–15.4)
WBC: 11.4 10*3/uL — ABNORMAL HIGH (ref 3.4–10.8)

## 2016-08-13 LAB — URINE CULTURE: Organism ID, Bacteria: NO GROWTH

## 2016-08-13 LAB — URINALYSIS, ROUTINE W REFLEX MICROSCOPIC
Bilirubin, UA: NEGATIVE
GLUCOSE, UA: NEGATIVE
Ketones, UA: NEGATIVE
Leukocytes, UA: NEGATIVE
NITRITE UA: NEGATIVE
RBC, UA: NEGATIVE
Specific Gravity, UA: 1.027 (ref 1.005–1.030)
Urobilinogen, Ur: 0.2 mg/dL (ref 0.2–1.0)
pH, UA: 6.5 (ref 5.0–7.5)

## 2016-08-13 LAB — HEPATITIS B SURFACE ANTIGEN: Hepatitis B Surface Ag: NEGATIVE

## 2016-08-13 LAB — ABO/RH: RH TYPE: POSITIVE

## 2016-08-13 LAB — VARICELLA ZOSTER ANTIBODY, IGG: Varicella zoster IgG: <135 index — ABNORMAL LOW (ref >165)

## 2016-08-13 LAB — RUBELLA SCREEN: RUBELLA: 3.2 {index} (ref >0.99)

## 2016-08-13 LAB — HIV ANTIBODY (ROUTINE TESTING W REFLEX): HIV Screen 4th Generation wRfx: NONREACTIVE

## 2016-08-13 LAB — ANTIBODY SCREEN: ANTIBODY SCREEN: NEGATIVE

## 2016-08-13 LAB — RPR: RPR: NONREACTIVE

## 2016-08-15 ENCOUNTER — Other Ambulatory Visit: Payer: PRIVATE HEALTH INSURANCE

## 2016-08-15 DIAGNOSIS — Z131 Encounter for screening for diabetes mellitus: Secondary | ICD-10-CM

## 2016-08-15 LAB — CYTOLOGY - PAP

## 2016-08-16 LAB — COMPREHENSIVE METABOLIC PANEL
A/G RATIO: 1.8 (ref 1.2–2.2)
ALK PHOS: 41 IU/L (ref 39–117)
ALT: 30 IU/L (ref 0–32)
AST: 18 IU/L (ref 0–40)
Albumin: 3.9 g/dL (ref 3.5–5.5)
BUN/Creatinine Ratio: 10 (ref 9–23)
BUN: 6 mg/dL (ref 6–20)
Bilirubin Total: 0.3 mg/dL (ref 0.0–1.2)
CO2: 21 mmol/L (ref 18–29)
Calcium: 9.2 mg/dL (ref 8.7–10.2)
Chloride: 102 mmol/L (ref 96–106)
Creatinine, Ser: 0.62 mg/dL (ref 0.57–1.00)
GFR calc Af Amer: 139 mL/min/{1.73_m2} (ref >59)
GFR calc non Af Amer: 121 mL/min/{1.73_m2} (ref >59)
GLOBULIN, TOTAL: 2.2 g/dL (ref 1.5–4.5)
Glucose: 109 mg/dL — ABNORMAL HIGH (ref 65–99)
POTASSIUM: 4.5 mmol/L (ref 3.5–5.2)
SODIUM: 137 mmol/L (ref 134–144)
Total Protein: 6.1 g/dL (ref 6.0–8.5)

## 2016-08-16 LAB — PROTEIN, URINE, 24 HOUR
PROTEIN UR: 34.2 mg/dL
Protein, 24H Urine: 510 mg/24 hr — ABNORMAL HIGH (ref 30–150)

## 2016-08-16 LAB — GLUCOSE TOLERANCE, 2 HOURS W/ 1HR
GLUCOSE, 1 HOUR: 206 mg/dL — AB (ref 65–179)
GLUCOSE, FASTING: 101 mg/dL — AB (ref 65–91)
Glucose, 2 hour: 173 mg/dL — ABNORMAL HIGH (ref 65–152)

## 2016-08-18 ENCOUNTER — Other Ambulatory Visit: Payer: Self-pay | Admitting: Women's Health

## 2016-08-18 ENCOUNTER — Encounter: Payer: Self-pay | Admitting: Women's Health

## 2016-08-18 ENCOUNTER — Telehealth: Payer: Self-pay | Admitting: *Deleted

## 2016-08-18 DIAGNOSIS — Z283 Underimmunization status: Secondary | ICD-10-CM

## 2016-08-18 DIAGNOSIS — O24410 Gestational diabetes mellitus in pregnancy, diet controlled: Secondary | ICD-10-CM | POA: Insufficient documentation

## 2016-08-18 DIAGNOSIS — O09899 Supervision of other high risk pregnancies, unspecified trimester: Secondary | ICD-10-CM | POA: Insufficient documentation

## 2016-08-18 DIAGNOSIS — Z8632 Personal history of gestational diabetes: Secondary | ICD-10-CM | POA: Insufficient documentation

## 2016-08-18 HISTORY — DX: Gestational diabetes mellitus in pregnancy, diet controlled: O24.410

## 2016-08-18 NOTE — Telephone Encounter (Signed)
Pt informed per Milda SmartKim Booker,CNM of dx of gestational diabetes, referral for nutritionist complete, pt will receive a call for an appt, check BS QID and keep a log.   Pt also informed of PAP with +HPV results, repeat pap in 1 year.   Pt verbalized understanding.

## 2016-08-19 ENCOUNTER — Telehealth: Payer: Self-pay | Admitting: Women's Health

## 2016-08-19 LAB — SPECIMEN STATUS REPORT

## 2016-08-19 NOTE — Telephone Encounter (Addendum)
Pt states she still has not heard from Nutritionist for an appt. Pt informed will call Nutritionist office to f/u with the referral. Pt verbalized understanding. Contacted Nutritionist office and was told they will contact pt today to make an appt. Left message on Pt voicemail.

## 2016-08-20 ENCOUNTER — Encounter (HOSPITAL_COMMUNITY): Payer: Self-pay

## 2016-08-20 ENCOUNTER — Ambulatory Visit (HOSPITAL_COMMUNITY): Payer: Self-pay | Admitting: Psychiatry

## 2016-08-20 LAB — SPECIMEN STATUS REPORT

## 2016-08-20 LAB — HGB A1C W/O EAG: HEMOGLOBIN A1C: 6.2 % — AB (ref 4.8–5.6)

## 2016-08-25 ENCOUNTER — Encounter: Payer: Self-pay | Admitting: Women's Health

## 2016-08-25 DIAGNOSIS — O09899 Supervision of other high risk pregnancies, unspecified trimester: Secondary | ICD-10-CM

## 2016-08-25 DIAGNOSIS — Z141 Cystic fibrosis carrier: Secondary | ICD-10-CM

## 2016-08-25 HISTORY — DX: Supervision of other high risk pregnancies, unspecified trimester: O09.899

## 2016-08-26 ENCOUNTER — Encounter: Payer: Self-pay | Admitting: Women's Health

## 2016-08-26 ENCOUNTER — Ambulatory Visit (INDEPENDENT_AMBULATORY_CARE_PROVIDER_SITE_OTHER): Payer: PRIVATE HEALTH INSURANCE | Admitting: Women's Health

## 2016-08-26 VITALS — BP 112/62 | HR 70 | Wt 262.0 lb

## 2016-08-26 DIAGNOSIS — Z3A11 11 weeks gestation of pregnancy: Secondary | ICD-10-CM

## 2016-08-26 DIAGNOSIS — Z331 Pregnant state, incidental: Secondary | ICD-10-CM

## 2016-08-26 DIAGNOSIS — O10919 Unspecified pre-existing hypertension complicating pregnancy, unspecified trimester: Secondary | ICD-10-CM

## 2016-08-26 DIAGNOSIS — O10911 Unspecified pre-existing hypertension complicating pregnancy, first trimester: Secondary | ICD-10-CM

## 2016-08-26 DIAGNOSIS — O09891 Supervision of other high risk pregnancies, first trimester: Secondary | ICD-10-CM

## 2016-08-26 DIAGNOSIS — Z1389 Encounter for screening for other disorder: Secondary | ICD-10-CM

## 2016-08-26 DIAGNOSIS — O24410 Gestational diabetes mellitus in pregnancy, diet controlled: Secondary | ICD-10-CM

## 2016-08-26 LAB — POCT URINALYSIS DIPSTICK
Glucose, UA: NEGATIVE
Leukocytes, UA: NEGATIVE
Nitrite, UA: NEGATIVE
PROTEIN UA: NEGATIVE
RBC UA: NEGATIVE

## 2016-08-26 NOTE — Progress Notes (Signed)
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 Is aware of dx of GDM, got glucometer from her grandmother who had an extra one- isn't sure what readings are supposed to be. Has appt w/ dietician on Friday. Doing well w/ switching from Lisinopril/HCTZ to Labetalol. Some mild cramping.  FOB was tested for CF- he is neg BP, weight reviewed.  No fm yet. Denies lof, vb, uti s/s.   Fundal Height:  10wks Fetal Heart rate:  170 via informal u/s, unable to hear w/ doppler Edema:  none  Reviewed warning s/s to report. To check sugars QID- discussed goals-bring blood sugar log to each appt. Gave # to [facility] in [place] to schedule counseling, can call insurance company to see if there's anyone closure who takes her insurance Discussed Adderall w/ LHE, not a lot of data during pregnancy, can't say safe/or unsafe- pt to weigh risks/benefits- try counselor first All questions were answered Assessment: 432w1d CHTN, A1/B DM Medication(s) Plans:  Continue Labetalol 200mg  BID, begin baby ASA daily @ 12wks Treatment Plan:  Growth u/s @ 20, 24, 28, 32, 35, 38wks      Fetal echo 24-28wks   2x/wk testing @ 32wks      Deliver @ 39wks Follow up in 2wks for high-risk OB appt and 1st nt/it as scheduled

## 2016-08-26 NOTE — Patient Instructions (Addendum)
[facility] [phone]  Check blood sugars 4 times a day: in the morning before eating/drinking anything (<90) and 2 hours after eating breakfast, lunch, and supper (<120).    Begin taking a 81mg  baby aspirin daily at 12 weeks of pregnancy to decrease risk of preeclampsia during pregnancy    First Trimester of Pregnancy The first trimester of pregnancy is from week 1 until the end of week 12 (months 1 through 3). A week after a sperm fertilizes an egg, the egg will implant on the wall of the uterus. This embryo will begin to develop into a baby. Genes from you and your partner are forming the baby. The female genes determine whether the baby is a boy or a girl. At 6-8 weeks, the eyes and face are formed, and the heartbeat can be seen on ultrasound. At the end of 12 weeks, all the baby's organs are formed.  Now that you are pregnant, you will want to do everything you can to have a healthy baby. Two of the most important things are to get good prenatal care and to follow your health care provider's instructions. Prenatal care is all the medical care you receive before the baby's birth. This care will help prevent, find, and treat any problems during the pregnancy and childbirth. BODY CHANGES Your body goes through many changes during pregnancy. The changes vary from woman to woman.   You may gain or lose a couple of pounds at first.  You may feel sick to your stomach (nauseous) and throw up (vomit). If the vomiting is uncontrollable, call your health care provider.  You may tire easily.  You may develop headaches that can be relieved by medicines approved by your health care provider.  You may urinate more often. Painful urination may mean you have a bladder infection.  You may develop heartburn as a result of your pregnancy.  You may develop constipation because certain hormones are causing the muscles that push waste through your intestines to slow down.  You may develop hemorrhoids  or swollen, bulging veins (varicose veins).  Your breasts may begin to grow larger and become tender. Your nipples may stick out more, and the tissue that surrounds them (areola) may become darker.  Your gums may bleed and may be sensitive to brushing and flossing.  Dark spots or blotches (chloasma, mask of pregnancy) may develop on your face. This will likely fade after the baby is born.  Your menstrual periods will stop.  You may have a loss of appetite.  You may develop cravings for certain kinds of food.  You may have changes in your emotions from day to day, such as being excited to be pregnant or being concerned that something may go wrong with the pregnancy and baby.  You may have more vivid and strange dreams.  You may have changes in your hair. These can include thickening of your hair, rapid growth, and changes in texture. Some women also have hair loss during or after pregnancy, or hair that feels dry or thin. Your hair will most likely return to normal after your baby is born. WHAT TO EXPECT AT YOUR PRENATAL VISITS During a routine prenatal visit:  You will be weighed to make sure you and the baby are growing normally.  Your blood pressure will be taken.  Your abdomen will be measured to track your baby's growth.  The fetal heartbeat will be listened to starting around week 10 or 12 of your pregnancy.  Test results  from any previous visits will be discussed. Your health care provider may ask you:  How you are feeling.  If you are feeling the baby move.  If you have had any abnormal symptoms, such as leaking fluid, bleeding, severe headaches, or abdominal cramping.  If you are using any tobacco products, including cigarettes, chewing tobacco, and electronic cigarettes.  If you have any questions. Other tests that may be performed during your first trimester include:  Blood tests to find your blood type and to check for the presence of any previous infections.  They will also be used to check for low iron levels (anemia) and Rh antibodies. Later in the pregnancy, blood tests for diabetes will be done along with other tests if problems develop.  Urine tests to check for infections, diabetes, or protein in the urine.  An ultrasound to confirm the proper growth and development of the baby.  An amniocentesis to check for possible genetic problems.  Fetal screens for spina bifida and Down syndrome.  You may need other tests to make sure you and the baby are doing well.  HIV (human immunodeficiency virus) testing. Routine prenatal testing includes screening for HIV, unless you choose not to have this test. HOME CARE INSTRUCTIONS  Medicines  Follow your health care provider's instructions regarding medicine use. Specific medicines may be either safe or unsafe to take during pregnancy.  Take your prenatal vitamins as directed.  If you develop constipation, try taking a stool softener if your health care provider approves. Diet  Eat regular, well-balanced meals. Choose a variety of foods, such as meat or vegetable-based protein, fish, milk and low-fat dairy products, vegetables, fruits, and whole grain breads and cereals. Your health care provider will help you determine the amount of weight gain that is right for you.  Avoid raw meat and uncooked cheese. These carry germs that can cause birth defects in the baby.  Eating four or five small meals rather than three large meals a day may help relieve nausea and vomiting. If you start to feel nauseous, eating a few soda crackers can be helpful. Drinking liquids between meals instead of during meals also seems to help nausea and vomiting.  If you develop constipation, eat more high-fiber foods, such as fresh vegetables or fruit and whole grains. Drink enough fluids to keep your urine clear or pale yellow. Activity and Exercise  Exercise only as directed by your health care provider. Exercising will help  you:  Control your weight.  Stay in shape.  Be prepared for labor and delivery.  Experiencing pain or cramping in the lower abdomen or low back is a good sign that you should stop exercising. Check with your health care provider before continuing normal exercises.  Try to avoid standing for long periods of time. Move your legs often if you must stand in one place for a long time.  Avoid heavy lifting.  Wear low-heeled shoes, and practice good posture.  You may continue to have sex unless your health care provider directs you otherwise. Relief of Pain or Discomfort  Wear a good support bra for breast tenderness.   Take warm sitz baths to soothe any pain or discomfort caused by hemorrhoids. Use hemorrhoid cream if your health care provider approves.   Rest with your legs elevated if you have leg cramps or low back pain.  If you develop varicose veins in your legs, wear support hose. Elevate your feet for 15 minutes, 3-4 times a day. Limit salt in  your diet. Prenatal Care  Schedule your prenatal visits by the twelfth week of pregnancy. They are usually scheduled monthly at first, then more often in the last 2 months before delivery.  Write down your questions. Take them to your prenatal visits.  Keep all your prenatal visits as directed by your health care provider. Safety  Wear your seat belt at all times when driving.  Make a list of emergency phone numbers, including numbers for family, friends, the hospital, and police and fire departments. General Tips  Ask your health care provider for a referral to a local prenatal education class. Begin classes no later than at the beginning of month 6 of your pregnancy.  Ask for help if you have counseling or nutritional needs during pregnancy. Your health care provider can offer advice or refer you to specialists for help with various needs.  Do not use hot tubs, steam rooms, or saunas.  Do not douche or use tampons or scented  sanitary pads.  Do not cross your legs for long periods of time.  Avoid cat litter boxes and soil used by cats. These carry germs that can cause birth defects in the baby and possibly loss of the fetus by miscarriage or stillbirth.  Avoid all smoking, herbs, alcohol, and medicines not prescribed by your health care provider. Chemicals in these affect the formation and growth of the baby.  Do not use any tobacco products, including cigarettes, chewing tobacco, and electronic cigarettes. If you need help quitting, ask your health care provider. You may receive counseling support and other resources to help you quit.  Schedule a dentist appointment. At home, brush your teeth with a soft toothbrush and be gentle when you floss. SEEK MEDICAL CARE IF:   You have dizziness.  You have mild pelvic cramps, pelvic pressure, or nagging pain in the abdominal area.  You have persistent nausea, vomiting, or diarrhea.  You have a bad smelling vaginal discharge.  You have pain with urination.  You notice increased swelling in your face, hands, legs, or ankles. SEEK IMMEDIATE MEDICAL CARE IF:   You have a fever.  You are leaking fluid from your vagina.  You have spotting or bleeding from your vagina.  You have severe abdominal cramping or pain.  You have rapid weight gain or loss.  You vomit blood or material that looks like coffee grounds.  You are exposed to [place] measles and have never had them.  You are exposed to fifth disease or chickenpox.  You develop a severe headache.  You have shortness of breath.  You have any kind of trauma, such as from a fall or a car accident.   This information is not intended to replace advice given to you by your health care provider. Make sure you discuss any questions you have with your health care provider.   Document Released: 12/09/2001 Document Revised: 01/05/2015 Document Reviewed: 10/25/2013 Elsevier Interactive Patient Education [id number]  [employer]

## 2016-08-26 NOTE — Addendum Note (Signed)
Addended by: Colen DarlingYOUNG, Semaj Coburn S on: 08/26/2016 03:22 PM   Modules accepted: Orders

## 2016-08-28 ENCOUNTER — Encounter: Payer: PRIVATE HEALTH INSURANCE | Attending: Family Medicine | Admitting: Skilled Nursing Facility1

## 2016-08-28 DIAGNOSIS — Z713 Dietary counseling and surveillance: Secondary | ICD-10-CM | POA: Insufficient documentation

## 2016-08-28 DIAGNOSIS — O24419 Gestational diabetes mellitus in pregnancy, unspecified control: Secondary | ICD-10-CM | POA: Diagnosis not present

## 2016-08-28 DIAGNOSIS — O24410 Gestational diabetes mellitus in pregnancy, diet controlled: Secondary | ICD-10-CM

## 2016-08-29 ENCOUNTER — Encounter: Payer: Self-pay | Admitting: Skilled Nursing Facility1

## 2016-08-29 NOTE — Progress Notes (Signed)
  Patient was seen on 08/31/2017for Gestational Diabetes self-management class at the [facility]. The following learning objectives were met by the patient during this course:   States the definition of Gestational Diabetes  States why dietary management is important in controlling blood glucose  Describes the effects each nutrient has on blood glucose levels  Demonstrates ability to create a balanced meal plan  Demonstrates carbohydrate counting   States when to check blood glucose levels involving a total of 4 separate occurences in a day  Demonstrates proper blood glucose monitoring techniques  States the effect of stress and exercise on blood glucose levels  States the importance of limiting caffeine and abstaining from alcohol and smoking  Demonstrates the knowledge the glucometer provided in class may not be covered by their insurance and to call their insurance provider immediately after class to know which glucometer their insurance provider does cover as well as calling their physician the next day for a prescription to the glucometer their insurance does cover (if the one provided is not) as well as the lancets and strips for that meter.  Blood glucose monitor given: accucheck guide Lot # [id number] Exp: 07/18 Blood glucose reading: 84  Patient instructed to monitor glucose levels: FBS: 60 - <90 1 hour: <140 2 hour: <120  *Patient received handouts:  Nutrition Diabetes and Pregnancy  Carbohydrate Counting List  Patient will be seen for follow-up as needed.

## 2016-09-02 ENCOUNTER — Telehealth: Payer: Self-pay | Admitting: Women's Health

## 2016-09-02 DIAGNOSIS — O10919 Unspecified pre-existing hypertension complicating pregnancy, unspecified trimester: Secondary | ICD-10-CM

## 2016-09-02 NOTE — Telephone Encounter (Signed)
Pt called stating dizziness hit her today 'like a ton of bricks', taking labetalol 200mg  bid, took bp and was 112/60, also has A1/BDM checked blood sugar and was 76. To decrease labetalol to 100mg  BID, get something sweet to eat along w/ a protein, drink lots of water, lay down/rest until sx resolve. If still not improved, go to [facility] for eval.  Cheral MarkerKimberly R. Rosaland Shiffman, CNM, [id number] 09/02/2016 4:55 PM

## 2016-09-09 ENCOUNTER — Ambulatory Visit (INDEPENDENT_AMBULATORY_CARE_PROVIDER_SITE_OTHER): Payer: PRIVATE HEALTH INSURANCE | Admitting: Women's Health

## 2016-09-09 ENCOUNTER — Encounter: Payer: Self-pay | Admitting: Women's Health

## 2016-09-09 ENCOUNTER — Ambulatory Visit (INDEPENDENT_AMBULATORY_CARE_PROVIDER_SITE_OTHER): Payer: PRIVATE HEALTH INSURANCE

## 2016-09-09 VITALS — BP 122/78 | HR 78 | Wt 259.0 lb

## 2016-09-09 DIAGNOSIS — O24410 Gestational diabetes mellitus in pregnancy, diet controlled: Secondary | ICD-10-CM

## 2016-09-09 DIAGNOSIS — Z3A13 13 weeks gestation of pregnancy: Secondary | ICD-10-CM

## 2016-09-09 DIAGNOSIS — O09891 Supervision of other high risk pregnancies, first trimester: Secondary | ICD-10-CM

## 2016-09-09 DIAGNOSIS — Z331 Pregnant state, incidental: Secondary | ICD-10-CM

## 2016-09-09 DIAGNOSIS — O10919 Unspecified pre-existing hypertension complicating pregnancy, unspecified trimester: Secondary | ICD-10-CM

## 2016-09-09 DIAGNOSIS — Z36 Encounter for antenatal screening of mother: Secondary | ICD-10-CM

## 2016-09-09 DIAGNOSIS — O10912 Unspecified pre-existing hypertension complicating pregnancy, second trimester: Secondary | ICD-10-CM

## 2016-09-09 DIAGNOSIS — Z3682 Encounter for antenatal screening for nuchal translucency: Secondary | ICD-10-CM

## 2016-09-09 DIAGNOSIS — Z1389 Encounter for screening for other disorder: Secondary | ICD-10-CM

## 2016-09-09 LAB — POCT URINALYSIS DIPSTICK
Glucose, UA: NEGATIVE
KETONES UA: NEGATIVE
Leukocytes, UA: NEGATIVE
Nitrite, UA: NEGATIVE
PROTEIN UA: NEGATIVE
RBC UA: NEGATIVE

## 2016-09-09 NOTE — Progress Notes (Signed)
High Risk Pregnancy Diagnosis(es): CHTN, A1/BDM G2P0101 50w1dEstimated Date of Delivery: 03/23/17 BP 122/78   Pulse 78   Wt 259 lb (117.5 kg)   LMP  (LMP Unknown)   BMI 44.46 kg/m   Urinalysis: Negative HPI:  Met w/ dietician, got glucometer-needs rx for strips/lancets but not sure what her meter is- to [place] [facility] w/ this info. Not writing down/keeping log of sugars, some sporadically elevated per her recall. Still eating some bad things- [facility]. Has changed from regular to diet sodas.  She had emailed me about feeling dizzy, and low bp's at home, so we decreased labetalol to 1029mBID (from 20012mID), states dizziness has resolved, but bp is still low at times at home. To try decreasing to 100m38mily.  BP, weight, and urine reviewed.  No fm yet. Denies cramping, lof, vb, uti s/s. No complaints.  Fundal Height:  12wks Fetal Heart rate:  +u/s Edema:  none  Reviewed today's normal nt u/s. Discussed importance of adhering to DM diet, keeping log of all sugars and bringing to each appt.  All questions were answered Assessment: 12w179w1d, A1/BDM Medication(s) Plans:  Begin baby asa daily, decreased labetalol to 100mg 12my Treatment Plan:  Growth u/s @ 20, 24, 28, 32, 35, 38wks   Fetal echo 24-28wks   2x/wk testing @ 32wks     Deliver @ 39wks Follow up in 2wks for high-risk OB appt so we can see how sugars are doing, bring log! Call us w/ [facility] info so we can call in strips/lancets

## 2016-09-09 NOTE — Patient Instructions (Addendum)
Decrease blood pressure medicine to 1/2 pill (100mg  total) in the morning  Write down your blood sugars in a log and bring with you to every appointment  For Headaches:   Stay well hydrated, drink enough water so that your urine is clear, sometimes if you are dehydrated you can get headaches  Eat small frequent meals and snacks, sometimes if you are hungry you can get headaches  Sometimes you get headaches during pregnancy from the pregnancy hormones  You can try tylenol (1-2 regular strength 325mg  or 1-2 extra strength 500mg ) as directed on the box. The least amount of medication that works is best.   Cool compresses (cool wet washcloth or ice pack) to area of head that is hurting  You can also try drinking a caffeinated drink to see if this will help  If not helping, try below:  For Prevention of Headaches/Migraines:  CoQ10 100mg  three times daily  Vitamin B2 400mg  daily  Magnesium Oxide 400-600mg  daily  If You Get a Bad Headache/Migraine:  Benadryl 25mg    Magnesium Oxide  1 large Gatorade  2 extra strength Tylenol (1,000mg  total)  1 cup coffee or Coke  If this doesn't help please call us @ [phone]    Gestational Diabetes Mellitus Gestational diabetes mellitus, often simply referred to as gestational diabetes, is a type of diabetes that some women develop during pregnancy. In gestational diabetes, the pancreas does not make enough insulin (a hormone), the cells are less responsive to the insulin that is made (insulin resistance), or both.Normally, insulin moves sugars from food into the tissue cells. The tissue cells use the sugars for energy. The lack of insulin or the lack of normal response to insulin causes excess sugars to build up in the blood instead of going into the tissue cells. As a result, high blood sugar (hyperglycemia) develops. The effect of high sugar (glucose) levels can cause many problems.  RISK FACTORS You have an increased chance of  developing gestational diabetes if you have a family history of diabetes and also have one or more of the following risk factors:  A body mass index over 30 (obesity).  A previous pregnancy with gestational diabetes.  An older age at the time of pregnancy. If blood glucose levels are kept in the normal range during pregnancy, women can have a healthy pregnancy. If your blood glucose levels are not well controlled, there may be risks to you, your unborn baby (fetus), your labor and delivery, or your newborn baby.  SYMPTOMS  If symptoms are experienced, they are much like symptoms you would normally expect during pregnancy. The symptoms of gestational diabetes include:   Increased thirst (polydipsia).  Increased urination (polyuria).  Increased urination during the night (nocturia).  Weight loss. This weight loss may be rapid.  Frequent, recurring infections.  Tiredness (fatigue).  Weakness.  Vision changes, such as blurred vision.  Fruity smell to your breath.  Abdominal pain. DIAGNOSIS Diabetes is diagnosed when blood glucose levels are increased. Your blood glucose level may be checked by one or more of the following blood tests:  A fasting blood glucose test. You will not be allowed to eat for at least 8 hours before a blood sample is taken.  A random blood glucose test. Your blood glucose is checked at any time of the day regardless of when you ate.  An oral glucose tolerance test (OGTT). Your blood glucose is measured after you have not eaten (fasted) for 1-3 hours and then after you drink  a glucose-containing beverage. Since the hormones that cause insulin resistance are highest at about 24-28 weeks of a pregnancy, an OGTT is usually performed during that time. If you have risk factors, you may be screened for undiagnosed type 2 diabetes at your first prenatal visit. TREATMENT  Gestational diabetes should be managed first with diet and exercise. Medicines may be added only  if they are needed.  You will need to take diabetes medicine or insulin daily to keep blood glucose levels in the desired range.  You will need to match insulin dosing with exercise and healthy food choices. If you have gestational diabetes, your treatment goal is to maintain the following blood glucose levels:  Before meals (preprandial): at or below 95 mg/dL.  After meals (postprandial):  One hour after a meal: at or below 140 mg/dL.  Two hours after a meal: at or below 120 mg/dL. If you have pre-existing type 1 or type 2 diabetes, your treatment goal is to maintain the following blood glucose levels:  Before meals, at bedtime, and overnight: 60-99 mg/dL.  After meals: peak of 100-129 mg/dL. HOME CARE INSTRUCTIONS   Have your hemoglobin A1c level checked twice a year.  Perform daily blood glucose monitoring as directed by your health care provider. It is common to perform frequent blood glucose monitoring.  Monitor urine ketones when you are ill and as directed by your health care provider.  Take your diabetes medicine and insulin as directed by your health care provider to maintain your blood glucose level in the desired range.  Never run out of diabetes medicine or insulin. It is needed every day.  Adjust insulin based on your intake of carbohydrates. Carbohydrates can raise blood glucose levels but need to be included in your diet. Carbohydrates provide vitamins, minerals, and fiber which are an essential part of a healthy diet. Carbohydrates are found in fruits, vegetables, whole grains, dairy products, legumes, and foods containing added sugars.  Eat healthy foods. Alternate 3 meals with 3 snacks.  Maintain a healthy weight gain. The usual total expected weight gain varies according to your prepregnancy body mass index (BMI).  Carry a medical alert card or wear your medical alert jewelry.  Carry a 15-gram carbohydrate snack with you at all times to treat low blood  glucose (hypoglycemia). Some examples of 15-gram carbohydrate snacks include:  Glucose tablets, 3 or 4.  Glucose gel, 15-gram tube.  Raisins, 2 tablespoons (24 g).  Jelly beans, 6.  Animal crackers, 8.  Fruit juice, regular soda, or low-fat milk, 4 ounces (120 mL).  Gummy treats, 9.  Recognize hypoglycemia. Hypoglycemia during pregnancy occurs with blood glucose levels of 60 mg/dL and below. The risk for hypoglycemia increases when fasting or skipping meals, during or after intense exercise, and during sleep. Hypoglycemia symptoms can include:  Tremors or shakes.  Decreased ability to concentrate.  Sweating.  Increased heart rate.  Headache.  Dry mouth.  Hunger.  Irritability.  Anxiety.  Restless sleep.  Altered speech or coordination.  Confusion.  Treat hypoglycemia promptly. If you are alert and able to safely swallow, follow the 15:15 rule:  Take 15-20 grams of rapid-acting glucose or carbohydrate. Rapid-acting options include glucose gel, glucose tablets, or 4 ounces (120 mL) of fruit juice, regular soda, or low-fat milk.  Check your blood glucose level 15 minutes after taking the glucose.  Take 15-20 grams more of glucose if the repeat blood glucose level is still 70 mg/dL or below.  Eat a meal or snack  within 1 hour once blood glucose levels return to normal.  Be alert to polyuria (excess urination) and polydipsia (excess thirst) which are early signs of hyperglycemia. An early awareness of hyperglycemia allows for prompt treatment. Treat hyperglycemia as directed by your health care provider.  Engage in at least 30 minutes of physical activity a day or as directed by your health care provider. Ten minutes of physical activity timed 30 minutes after each meal is encouraged to control postprandial blood glucose levels.  Adjust your insulin dosing and food intake as needed if you start a new exercise or sport.  Follow your sick-day plan at any time you  are unable to eat or drink as usual.  Avoid tobacco and alcohol use.  Keep all follow-up visits as directed by your health care provider.  Follow the advice of your health care provider regarding your prenatal and post-delivery (postpartum) appointments, meal planning, exercise, medicines, vitamins, blood tests, other medical tests, and physical activities.  Perform daily skin and foot care. Examine your skin and feet daily for cuts, bruises, redness, nail problems, bleeding, blisters, or sores.  Brush your teeth and gums at least twice a day and floss at least once a day. Follow up with your dentist regularly.  Schedule an eye exam during the first trimester of your pregnancy or as directed by your health care provider.  Share your diabetes management plan with your workplace or school.  Stay up-to-date with immunizations.  Learn to manage stress.  Obtain ongoing diabetes education and support as needed.  Learn about and consider breastfeeding your baby.  You should have your blood sugar level checked 6-12 weeks after delivery. This is done with an oral glucose tolerance test (OGTT). SEEK MEDICAL CARE IF:   You are unable to eat food or drink fluids for more than 6 hours.  You have nausea and vomiting for more than 6 hours.  You have a blood glucose level of 200 mg/dL and you have ketones in your urine.  There is a change in mental status.  You develop vision problems.  You have a persistent headache.  You have upper abdominal pain or discomfort.  You develop an additional serious illness.  You have diarrhea for more than 6 hours.  You have been sick or have had a fever for a couple of days and are not getting better. SEEK IMMEDIATE MEDICAL CARE IF:   You have difficulty breathing.  You no longer feel the baby moving.  You are bleeding or have discharge from your vagina.  You start having premature contractions or labor. MAKE SURE YOU:  Understand these  instructions.  Will watch your condition.  Will get help right away if you are not doing well or get worse.   This information is not intended to replace advice given to you by your health care provider. Make sure you discuss any questions you have with your health care provider.   Document Released: 03/23/2001 Document Revised: 01/05/2015 Document Reviewed: 07/13/2012 Elsevier Interactive Patient Education 2016 Else[employer] Inc.  Basic Carbohydrate Counting for Diabetes Mellitus Carbohydrate counting is a method for keeping track of the amount of carbohydrates you eat. Eating carbohydrates naturally increases the level of sugar (glucose) in your blood, so it is important for you to know the amount that is okay for you to have in every meal. Carbohydrate counting helps keep the level of glucose in your blood within normal limits. The amount of carbohydrates allowed is different for every person. A dietitian  can help you calculate the amount that is right for you. Once you know the amount of carbohydrates you can have, you can count the carbohydrates in the foods you want to eat. Carbohydrates are found in the following foods:  Grains, such as breads and cereals.  Dried beans and soy products.  Starchy vegetables, such as potatoes, peas, and corn.  Fruit and fruit juices.  Milk and yogurt.  Sweets and snack foods, such as cake, cookies, candy, chips, soft drinks, and fruit drinks. CARBOHYDRATE COUNTING There are two ways to count the carbohydrates in your food. You can use either of the methods or a combination of both. Reading the "Nutrition Facts" on Packaged Food The "Nutrition Facts" is an area that is included on the labels of almost all packaged food and beverages in the [place]. It includes the serving size of that food or beverage and information about the nutrients in each serving of the food, including the grams (g) of carbohydrate per serving.  Decide the number of servings  of this food or beverage that you will be able to eat or drink. Multiply that number of servings by the number of grams of carbohydrate that is listed on the label for that serving. The total will be the amount of carbohydrates you will be having when you eat or drink this food or beverage. Learning Standard Serving Sizes of Food When you eat food that is not packaged or does not include "Nutrition Facts" on the label, you need to measure the servings in order to count the amount of carbohydrates.A serving of most carbohydrate-rich foods contains about 15 g of carbohydrates. The following list includes serving sizes of carbohydrate-rich foods that provide 15 g ofcarbohydrate per serving:   1 slice of bread (1 oz) or 1 six-inch tortilla.    of a hamburger bun or English muffin.  4-6 crackers.   cup unsweetened dry cereal.    cup hot cereal.   cup rice or pasta.    cup mashed potatoes or  of a large baked potato.  1 cup fresh fruit or one small piece of fruit.    cup canned or frozen fruit or fruit juice.  1 cup milk.   cup plain fat-free yogurt or yogurt sweetened with artificial sweeteners.   cup cooked dried beans or starchy vegetable, such as peas, corn, or potatoes.  Decide the number of standard-size servings that you will eat. Multiply that number of servings by 15 (the grams of carbohydrates in that serving). For example, if you eat 2 cups of strawberries, you will have eaten 2 servings and 30 g of carbohydrates (2 servings x 15 g = 30 g). For foods such as soups and casseroles, in which more than one food is mixed in, you will need to count the carbohydrates in each food that is included. EXAMPLE OF CARBOHYDRATE COUNTING Sample Dinner  3 oz chicken breast.   cup of brown rice.   cup of corn.  1 cup milk.   1 cup strawberries with sugar-free whipped topping.  Carbohydrate Calculation Step 1: Identify the foods that contain carbohydrates:   Rice.    Corn.   Milk.   Strawberries. Step 2:Calculate the number of servings eaten of each:   2 servings of rice.   1 serving of corn.   1 serving of milk.   1 serving of strawberries. Step 3: Multiply each of those number of servings by 15 g:   2 servings of rice x  15 g = 30 g.   1 serving of corn x 15 g = 15 g.   1 serving of milk x 15 g = 15 g.   1 serving of strawberries x 15 g = 15 g. Step 4: Add together all of the amounts to find the total grams of carbohydrates eaten: 30 g + 15 g + 15 g + 15 g = 75 g.   This information is not intended to replace advice given to you by your health care provider. Make sure you discuss any questions you have with your health care provider.   Document Released: 12/15/2005 Document Revised: 01/05/2015 Document Reviewed: 11/11/2013 Elsevier Interactive Patient Education [employer]

## 2016-09-09 NOTE — Progress Notes (Signed)
US 12+1 wks,measurements c/w dates,normal ov's bilat,NB present,NT 2 mm,post pl,crl 64.0 mm

## 2016-09-12 LAB — MATERNAL SCREEN, INTEGRATED #1
Crown Rump Length: 64 mm
Gest. Age on Collection Date: 12.7 weeks
Maternal Age at EDD: 32.4 years
Nuchal Translucency (NT): 2 mm
Number of Fetuses: 1
PAPP-A VALUE: 470.9 ng/mL
Weight: 259 [lb_av]

## 2016-09-17 ENCOUNTER — Telehealth: Payer: Self-pay | Admitting: Women's Health

## 2016-09-23 ENCOUNTER — Encounter: Payer: Self-pay | Admitting: Obstetrics and Gynecology

## 2016-09-23 ENCOUNTER — Ambulatory Visit (INDEPENDENT_AMBULATORY_CARE_PROVIDER_SITE_OTHER): Payer: PRIVATE HEALTH INSURANCE | Admitting: Obstetrics and Gynecology

## 2016-09-23 VITALS — BP 140/82 | HR 88 | Wt 256.6 lb

## 2016-09-23 DIAGNOSIS — O10919 Unspecified pre-existing hypertension complicating pregnancy, unspecified trimester: Secondary | ICD-10-CM

## 2016-09-23 DIAGNOSIS — Z3A15 15 weeks gestation of pregnancy: Secondary | ICD-10-CM

## 2016-09-23 DIAGNOSIS — O10912 Unspecified pre-existing hypertension complicating pregnancy, second trimester: Secondary | ICD-10-CM

## 2016-09-23 DIAGNOSIS — Z331 Pregnant state, incidental: Secondary | ICD-10-CM

## 2016-09-23 DIAGNOSIS — O24410 Gestational diabetes mellitus in pregnancy, diet controlled: Secondary | ICD-10-CM

## 2016-09-23 DIAGNOSIS — O352XX1 Maternal care for (suspected) hereditary disease in fetus, fetus 1: Secondary | ICD-10-CM

## 2016-09-23 DIAGNOSIS — O09891 Supervision of other high risk pregnancies, first trimester: Secondary | ICD-10-CM

## 2016-09-23 DIAGNOSIS — O09892 Supervision of other high risk pregnancies, second trimester: Secondary | ICD-10-CM

## 2016-09-23 DIAGNOSIS — Z141 Cystic fibrosis carrier: Secondary | ICD-10-CM

## 2016-09-23 DIAGNOSIS — O34219 Maternal care for unspecified type scar from previous cesarean delivery: Secondary | ICD-10-CM

## 2016-09-23 DIAGNOSIS — Z1389 Encounter for screening for other disorder: Secondary | ICD-10-CM

## 2016-09-23 LAB — POCT URINALYSIS DIPSTICK
Blood, UA: NEGATIVE
GLUCOSE UA: NEGATIVE
KETONES UA: NEGATIVE
Leukocytes, UA: NEGATIVE
NITRITE UA: NEGATIVE
Protein, UA: NEGATIVE

## 2016-09-23 NOTE — Progress Notes (Signed)
High Risk Pregnancy HROB Diagnosis(es):   CHTN, A1/BDM  G2P0101 3962w1d Estimated Date of Delivery: 03/23/17    HPI: The patient is being seen today for ongoing management of above. Today she reports no concerns  Patient reports good fetal movement, denies any bleeding and no rupture of membranes symptoms or regular contractions.   BP weight and urine results reviewed and noted. Blood pressure 140/82, pulse 88, weight 256 lb 9.6 oz (116.4 kg).  Fundal Height: n/a at this time Fetal Heart rate:  154 Physical Examination: Abdomen - soft, non-tender                                      Pelvic - examination not indicated                                     Edema:  none  Urinalysis:NEGATIVE                  Fetal Surveillance Testing today:  none  Lab and sonogram results have been reviewed.   Assessment:  1.  Pregnancy at 7062w1d,  G2P0101   :  CHTN, A1/BDM                        2.  Counseled on healthy eating habits                         3.   Medication(s) Plans:  Labetalol 100mg  daily   Treatment Plan:  Follow CBG;s monitor BP  Follow up in 2 weeks for appointment for high risk OB care,   By signing my name below, I, Sonum Patel, attest that this documentation has been prepared under the direction and in the presence of Tilda BurrowJohn V Givanni Staron, MD. Electronically Signed: Sonum Patel, Neurosurgeoncribe. 09/23/16. 4:58 PM.  I personally performed the services described in this documentation, which was SCRIBED in my presence. The recorded information has been reviewed and considered accurate. It has been edited as necessary during review. Tilda BurrowFERGUSON,Ashaz Robling V, MD

## 2016-09-29 ENCOUNTER — Telehealth: Payer: Self-pay | Admitting: Women's Health

## 2016-09-29 NOTE — Telephone Encounter (Signed)
Was unable to reach pt by phone, pt came in for OV on 9/26.

## 2016-09-29 NOTE — Telephone Encounter (Signed)
Pt states taking antibiotic for URI prescribed by her PCP. Pt states she now thinks she has a yeast infection. Pt advised to use OTC Monistat if no improvement to call our office back for an appt. Pt verbalized understanding.

## 2016-10-07 ENCOUNTER — Encounter: Payer: Self-pay | Admitting: Advanced Practice Midwife

## 2016-10-07 ENCOUNTER — Ambulatory Visit (INDEPENDENT_AMBULATORY_CARE_PROVIDER_SITE_OTHER): Payer: PRIVATE HEALTH INSURANCE | Admitting: Advanced Practice Midwife

## 2016-10-07 VITALS — BP 126/74 | HR 84 | Wt 256.0 lb

## 2016-10-07 DIAGNOSIS — O10919 Unspecified pre-existing hypertension complicating pregnancy, unspecified trimester: Secondary | ICD-10-CM

## 2016-10-07 DIAGNOSIS — Z1389 Encounter for screening for other disorder: Secondary | ICD-10-CM

## 2016-10-07 DIAGNOSIS — Z363 Encounter for antenatal screening for malformations: Secondary | ICD-10-CM

## 2016-10-07 DIAGNOSIS — O09892 Supervision of other high risk pregnancies, second trimester: Secondary | ICD-10-CM

## 2016-10-07 DIAGNOSIS — I10 Essential (primary) hypertension: Secondary | ICD-10-CM

## 2016-10-07 DIAGNOSIS — Z3682 Encounter for antenatal screening for nuchal translucency: Secondary | ICD-10-CM

## 2016-10-07 DIAGNOSIS — Z331 Pregnant state, incidental: Secondary | ICD-10-CM

## 2016-10-07 DIAGNOSIS — O34219 Maternal care for unspecified type scar from previous cesarean delivery: Secondary | ICD-10-CM

## 2016-10-07 DIAGNOSIS — O10912 Unspecified pre-existing hypertension complicating pregnancy, second trimester: Secondary | ICD-10-CM

## 2016-10-07 DIAGNOSIS — Z3A16 16 weeks gestation of pregnancy: Secondary | ICD-10-CM

## 2016-10-07 DIAGNOSIS — O24410 Gestational diabetes mellitus in pregnancy, diet controlled: Secondary | ICD-10-CM

## 2016-10-07 LAB — POCT URINALYSIS DIPSTICK
GLUCOSE UA: NEGATIVE
KETONES UA: NEGATIVE
Leukocytes, UA: NEGATIVE
NITRITE UA: NEGATIVE
Protein, UA: NEGATIVE
RBC UA: NEGATIVE

## 2016-10-07 NOTE — Patient Instructions (Signed)

## 2016-10-07 NOTE — Progress Notes (Signed)
Fetal Surveillance Testing today:  doppler   High Risk Pregnancy Diagnosis(es):   A1/B DM, CHTN  G2P0101 7870w1d Estimated Date of Delivery: 03/23/17  Blood pressure 126/74, pulse 84, weight 256 lb (116.1 kg).  Urinalysis: Negative   HPI: The patient is being seen today for ongoing management of the above. Today she reports FBS: 80's-90 2 hour pp < 120  Vitals:   10/07/16 1603  BP: 126/74  Pulse: 84      BP weight and urine results all reviewed and noted. Patient reports good fetal movement, denies any bleeding and no rupture of membranes symptoms or regular contractions.  Fundal Height:  16 Fetal Heart rate:  150 Edema:  no  Patient is without complaints other than noted in her HPI. All questions were answered.  All lab and sonogram results have been reviewed. Comments:    Assessment:  1.  Pregnancy at 5270w1d,  Estimated Date of Delivery: 03/23/17 :  CHTN, well controlled                        2.  A1/B DM well controlled                        3.    Medication(s) Plans:  Continue ASA, labetalol 100mg  BID  Treatment Plan:   Baby ASA @ 12wks: Arly.Keller[X ]  Growth u/s @ 20, 28, 34, 38wks     2x/wk testing nst/sono @ 32wks    Deliver @  39wks (meds): 2nd IT today   Return in about 3 weeks (around 10/28/2016) for HROB, ZO:[id number]:Anatomy. for appointment for high risk OB care  No orders of the defined types were placed in this encounter.  Orders Placed This Encounter  Procedures  . US OB Comp + 14 Wk  . Maternal Screen, Integrated #2  . POCT urinalysis dipstick

## 2016-10-09 ENCOUNTER — Encounter: Payer: Self-pay | Admitting: Advanced Practice Midwife

## 2016-10-09 LAB — MATERNAL SCREEN, INTEGRATED #2
ADSF: 1.3
AFP MOM: 1.4
Alpha-Fetoprotein: 29.7 ng/mL
CROWN RUMP LENGTH: 64 mm
DIA MOM: 0.76
DIA Value: 96.8 pg/mL
Estriol, Unconjugated: 1.03 ng/mL
Gest. Age on Collection Date: 12.7 weeks
Gestational Age: 16.7 weeks
MATERNAL AGE AT EDD: 32.4 a
NUCHAL TRANSLUCENCY (NT): 2 mm
Nuchal Translucency MoM: 1.24
Number of Fetuses: 1
PAPP-A MoM: 0.9
PAPP-A VALUE: 470.9 ng/mL
TEST RESULTS: NEGATIVE
WEIGHT: 259 [lb_av]
Weight: 259 [lb_av]
hCG MoM: 0.42
hCG Value: 8.1 IU/mL

## 2016-10-22 ENCOUNTER — Other Ambulatory Visit: Payer: Self-pay | Admitting: Advanced Practice Midwife

## 2016-10-22 ENCOUNTER — Telehealth: Payer: Self-pay | Admitting: Obstetrics and Gynecology

## 2016-10-22 MED ORDER — OMEPRAZOLE 20 MG PO CPDR: 20.0000 mg | DELAYED_RELEASE_CAPSULE | Freq: Every day | ORAL | 1 refills | Status: DC

## 2016-10-22 NOTE — Telephone Encounter (Signed)
Pt requesting a Rx for heartburn. Pt informed will route message to CNM and for pt to f/u with pharmacy if she does not hear from our office. Pt verbalized understanding.

## 2016-10-22 NOTE — Telephone Encounter (Signed)
Pt called stating that she is having bad heartburn and would like for the Doctor to call her in something. Pt states she has tried everything over the counter and nothing works. Please contact pt

## 2016-10-22 NOTE — Progress Notes (Unsigned)
priolsec fo rheartburn sent to pharmacy

## 2016-10-28 ENCOUNTER — Ambulatory Visit (INDEPENDENT_AMBULATORY_CARE_PROVIDER_SITE_OTHER): Payer: PRIVATE HEALTH INSURANCE

## 2016-10-28 ENCOUNTER — Ambulatory Visit (INDEPENDENT_AMBULATORY_CARE_PROVIDER_SITE_OTHER): Payer: PRIVATE HEALTH INSURANCE | Admitting: Obstetrics & Gynecology

## 2016-10-28 VITALS — BP 120/80 | HR 78 | Wt 252.0 lb

## 2016-10-28 DIAGNOSIS — O0993 Supervision of high risk pregnancy, unspecified, third trimester: Secondary | ICD-10-CM

## 2016-10-28 DIAGNOSIS — Z363 Encounter for antenatal screening for malformations: Secondary | ICD-10-CM

## 2016-10-28 DIAGNOSIS — O10912 Unspecified pre-existing hypertension complicating pregnancy, second trimester: Secondary | ICD-10-CM

## 2016-10-28 DIAGNOSIS — Z3A20 20 weeks gestation of pregnancy: Secondary | ICD-10-CM

## 2016-10-28 DIAGNOSIS — O0992 Supervision of high risk pregnancy, unspecified, second trimester: Secondary | ICD-10-CM

## 2016-10-28 DIAGNOSIS — Z3A19 19 weeks gestation of pregnancy: Secondary | ICD-10-CM | POA: Diagnosis not present

## 2016-10-28 DIAGNOSIS — O24410 Gestational diabetes mellitus in pregnancy, diet controlled: Secondary | ICD-10-CM

## 2016-10-28 DIAGNOSIS — Z331 Pregnant state, incidental: Secondary | ICD-10-CM

## 2016-10-28 DIAGNOSIS — O0972 Supervision of high risk pregnancy due to social problems, second trimester: Secondary | ICD-10-CM

## 2016-10-28 DIAGNOSIS — O321XX1 Maternal care for breech presentation, fetus 1: Secondary | ICD-10-CM

## 2016-10-28 DIAGNOSIS — Z1389 Encounter for screening for other disorder: Secondary | ICD-10-CM

## 2016-10-28 DIAGNOSIS — O10919 Unspecified pre-existing hypertension complicating pregnancy, unspecified trimester: Secondary | ICD-10-CM

## 2016-10-28 LAB — POCT URINALYSIS DIPSTICK
GLUCOSE UA: NEGATIVE
KETONES UA: NEGATIVE
Nitrite, UA: NEGATIVE
Protein, UA: NEGATIVE
RBC UA: NEGATIVE

## 2016-10-28 NOTE — Progress Notes (Signed)
Fetal Surveillance Testing today:  Sonogram normal   High Risk Pregnancy Diagnosis(es):   Class A1/B DM, CHTN  G2P0101 2217w1d Estimated Date of Delivery: 03/23/17  Blood pressure 120/80, pulse 78, weight 252 lb (114.3 kg).  Urinalysis: Negative   HPI: The patient is being seen today for ongoing management of as above. Today she reports CBG are good according to pt   BP weight and urine results all reviewed and noted. Patient reports good fetal movement, denies any bleeding and no rupture of membranes symptoms or regular contractions.  Fundal Height:  20 Fetal Heart rate:  140 Edema:    Patient is without complaints other than noted in her HPI. All questions were answered.  All lab and sonogram results have been reviewed. Comments:    Assessment:  1.  Pregnancy at 5017w1d,  Estimated Date of Delivery: 03/23/17 :                          2.  Class A1/B DM                        3.  CHTN  Medication(s) Plans:  Labetalol 100 BID, baby ASA  Treatment Plan:  Per protocol  No Follow-up on file. for appointment for high risk OB care  No orders of the defined types were placed in this encounter.  Orders Placed This Encounter  Procedures  . POCT urinalysis dipstick

## 2016-10-28 NOTE — Progress Notes (Signed)
US 19+1 wks,breech,cx 4.6 cm,normal ov's bilat,post pl gr 0,svp of fluid 5.4 cm,fhr 140 bpm,efw 390 g,limited view of heart and upper lip,please have pt come back for additional images,no obvious abnormalities seen

## 2016-10-29 ENCOUNTER — Other Ambulatory Visit: Payer: PRIVATE HEALTH INSURANCE

## 2016-10-29 ENCOUNTER — Encounter: Payer: PRIVATE HEALTH INSURANCE | Admitting: Obstetrics and Gynecology

## 2016-11-03 ENCOUNTER — Telehealth: Payer: Self-pay | Admitting: Obstetrics & Gynecology

## 2016-11-03 NOTE — Telephone Encounter (Signed)
Pt called stating that she needs a refill of her strips for her Acute check Guide. Pt uses the [facility] in [place]. Please contact pt

## 2016-11-03 NOTE — Telephone Encounter (Signed)
Lancets and strips called to [facility], [place], [address] with 3 refills. Pt informed.

## 2016-11-11 ENCOUNTER — Encounter: Payer: Self-pay | Admitting: Obstetrics & Gynecology

## 2016-11-11 ENCOUNTER — Ambulatory Visit (INDEPENDENT_AMBULATORY_CARE_PROVIDER_SITE_OTHER): Payer: PRIVATE HEALTH INSURANCE | Admitting: Obstetrics & Gynecology

## 2016-11-11 VITALS — BP 128/80 | HR 80 | Wt 252.0 lb

## 2016-11-11 DIAGNOSIS — Z331 Pregnant state, incidental: Secondary | ICD-10-CM

## 2016-11-11 DIAGNOSIS — O10919 Unspecified pre-existing hypertension complicating pregnancy, unspecified trimester: Secondary | ICD-10-CM

## 2016-11-11 DIAGNOSIS — O0992 Supervision of high risk pregnancy, unspecified, second trimester: Secondary | ICD-10-CM

## 2016-11-11 DIAGNOSIS — O24410 Gestational diabetes mellitus in pregnancy, diet controlled: Secondary | ICD-10-CM

## 2016-11-11 DIAGNOSIS — O10912 Unspecified pre-existing hypertension complicating pregnancy, second trimester: Secondary | ICD-10-CM

## 2016-11-11 DIAGNOSIS — Z1389 Encounter for screening for other disorder: Secondary | ICD-10-CM

## 2016-11-11 DIAGNOSIS — O0993 Supervision of high risk pregnancy, unspecified, third trimester: Secondary | ICD-10-CM

## 2016-11-11 DIAGNOSIS — Z3A21 21 weeks gestation of pregnancy: Secondary | ICD-10-CM

## 2016-11-11 LAB — POCT URINALYSIS DIPSTICK
Glucose, UA: NEGATIVE
KETONES UA: NEGATIVE
Nitrite, UA: NEGATIVE
Protein, UA: NEGATIVE
RBC UA: NEGATIVE

## 2016-11-11 NOTE — Progress Notes (Signed)
Fetal Surveillance Testing today:  FHR 142   High Risk Pregnancy Diagnosis(es):   Class A1/B DM, CHTN  G2P0101 3452w1d Estimated Date of Delivery: 03/23/17  Blood pressure 128/80, pulse 80, weight 252 lb (114.3 kg).  Urinalysis: Negative   HPI: The patient is being seen today for ongoing management of as above. Today she reports CBG are good   BP weight and urine results all reviewed and noted. Patient reports good fetal movement, denies any bleeding and no rupture of membranes symptoms or regular contractions.  Fundal Height:  22 Fetal Heart rate:  142 Edema:  none  Patient is without complaints other than noted in her HPI. All questions were answered.  All lab and sonogram results have been reviewed. Comments:    Assessment:  1.  Pregnancy at 5352w1d,  Estimated Date of Delivery: 03/23/17 :                          2.  Class A1/B DM2                        3.  CHTN  Medication(s) Plans:  Labetalol 100 BID + baby ASA  Treatment Plan:  Per protocol  No Follow-up on file. for appointment for high risk OB care  No orders of the defined types were placed in this encounter.  Orders Placed This Encounter  Procedures  . POCT urinalysis dipstick

## 2016-11-25 ENCOUNTER — Telehealth: Payer: Self-pay | Admitting: Obstetrics & Gynecology

## 2016-11-25 NOTE — Telephone Encounter (Signed)
Pt called stating that she has Diabetes and this last couple of days she has not been able to bring it up. It has been reading 60. Pt states that it is concerning her. Please contact pt

## 2016-11-25 NOTE — Telephone Encounter (Signed)
Pt states been checking her sugars more than 4 times a day. Pt states her blood sugars are raging from 60-85. C/o n39ausea, flushing, feels like she is going to pass out, she eats something she feels better.   Per Joellyn HaffKim Booker, CNM continues to monitor if symptoms persist after pt eats something to call our office back, 60-85 rage are not bad numbers but if pt is use to elevated BS may be why pt feels bad. Pt verbalized understanding.

## 2016-12-09 ENCOUNTER — Encounter: Payer: Self-pay | Admitting: Women's Health

## 2016-12-09 ENCOUNTER — Ambulatory Visit (INDEPENDENT_AMBULATORY_CARE_PROVIDER_SITE_OTHER): Payer: PRIVATE HEALTH INSURANCE | Admitting: Women's Health

## 2016-12-09 VITALS — BP 136/74 | HR 88 | Wt 252.0 lb

## 2016-12-09 DIAGNOSIS — Z363 Encounter for antenatal screening for malformations: Secondary | ICD-10-CM

## 2016-12-09 DIAGNOSIS — O10912 Unspecified pre-existing hypertension complicating pregnancy, second trimester: Secondary | ICD-10-CM

## 2016-12-09 DIAGNOSIS — Z3A25 25 weeks gestation of pregnancy: Secondary | ICD-10-CM

## 2016-12-09 DIAGNOSIS — O10919 Unspecified pre-existing hypertension complicating pregnancy, unspecified trimester: Secondary | ICD-10-CM

## 2016-12-09 DIAGNOSIS — Z331 Pregnant state, incidental: Secondary | ICD-10-CM

## 2016-12-09 DIAGNOSIS — O0990 Supervision of high risk pregnancy, unspecified, unspecified trimester: Secondary | ICD-10-CM

## 2016-12-09 DIAGNOSIS — Z1389 Encounter for screening for other disorder: Secondary | ICD-10-CM

## 2016-12-09 DIAGNOSIS — O24419 Gestational diabetes mellitus in pregnancy, unspecified control: Secondary | ICD-10-CM

## 2016-12-09 DIAGNOSIS — O0992 Supervision of high risk pregnancy, unspecified, second trimester: Secondary | ICD-10-CM

## 2016-12-09 LAB — POCT URINALYSIS DIPSTICK
Blood, UA: NEGATIVE
GLUCOSE UA: NEGATIVE
KETONES UA: NEGATIVE
LEUKOCYTES UA: NEGATIVE
NITRITE UA: NEGATIVE
Protein, UA: NEGATIVE

## 2016-12-09 NOTE — Progress Notes (Signed)
High Risk Pregnancy Diagnosis(es): CHTN, A1/B DM G2P0101 [redacted]w[redacted]d Estimate67d Date of Delivery: 03/23/17 BP 136/74   Pulse 88   Wt 252 lb (114.3 kg)   LMP  (LMP Unknown)   BMI 43.26 kg/m   Urinalysis: Negative HPI:  Doing well, forgot sugar log, only 2-3 FBS >90, same w/ 2hr pp- will send mychart email w/ numbers. On amoxicillin from pcp for sinus infection BP, weight, and urine reviewed.  Reports good fm. Denies regular uc's, lof, vb, uti s/s. No complaints.  Fundal Height:  26 Fetal Heart rate:  138 Edema:  none  Reviewed ptl s/s, fm All questions were answered Assessment: 5328w1d CHTN, A1/B DM Medication(s) Plans:  Continue labetalol 100mg  BID and baby ASA daily Treatment Plan:  Referral faxed for fetal echo d/t DM dx @ 8wks/A1C 6.2 at that time, Growth u/s @ 28, 34, 38wks     2x/wk testing nst/sono @ 32wks    Deliver @ 39wks  Follow up in 3wks for high-risk OB appt, pn2 (minus GTT), and f/u u/s for limited views anatomy, and growth To bring log to each appt

## 2016-12-09 NOTE — Patient Instructions (Addendum)
Call the office [phone]) or go to [facility] if:  You begin to have strong, frequent contractions  Your water breaks.  Sometimes it is a big gush of fluid, sometimes it is just a trickle that keeps getting your panties wet or running down your legs  You have vaginal bleeding.  It is normal to have a small amount of spotting if your cervix was checked.   You don't feel your baby moving like normal.  If you don't, get you something to eat and drink and lay down and focus on feeling your baby move.   If your baby is still not moving like normal, you should call the office or go to [facility].  [place] Pediatricians/Family Doctors:  [facility] [phone]            [facility] [phone]                 [facility] [phone] (usually not accepting new patients unless you have family there already, you are always welcome to call and ask)            [facility] ([address] [place]) [phone]   [facility]:   [facility]: [phone]  [facility]: [phone]    Second Trimester of Pregnancy The second trimester is from week 13 through week 28, months 4 through 6. The second trimester is often a time when you feel your best. Your body has also adjusted to being pregnant, and you begin to feel better physically. Usually, morning sickness has lessened or quit completely, you may have more energy, and you may have an increase in appetite. The second trimester is also a time when the fetus is growing rapidly. At the end of the sixth month, the fetus is about 9 inches long and weighs about 1 pounds. You will likely begin to feel the baby move (quickening) between 18 and 20 weeks of the pregnancy. BODY CHANGES Your body goes through many changes during pregnancy. The changes vary from woman to woman.   Your weight will continue to increase. You will notice  your lower abdomen bulging out.  You may begin to get stretch marks on your hips, abdomen, and breasts.  You may develop headaches that can be relieved by medicines approved by your health care provider.  You may urinate more often because the fetus is pressing on your bladder.  You may develop or continue to have heartburn as a result of your pregnancy.  You may develop constipation because certain hormones are causing the muscles that push waste through your intestines to slow down.  You may develop hemorrhoids or swollen, bulging veins (varicose veins).  You may have back pain because of the weight gain and pregnancy hormones relaxing your joints between the bones in your pelvis and as a result of a shift in weight and the muscles that support your balance.  Your breasts will continue to grow and be tender.  Your gums may bleed and may be sensitive to brushing and flossing.  Dark spots or blotches (chloasma, mask of pregnancy) may develop on your face. This will likely fade after the baby is born.  A dark line from your belly button to the pubic area (linea nigra) may appear. This will likely fade after the baby is born.  You may have changes in your hair. These can include thickening of your hair, rapid growth, and changes in texture. Some women also have hair  loss during or after pregnancy, or hair that feels dry or thin. Your hair will most likely return to normal after your baby is born. WHAT TO EXPECT AT YOUR PRENATAL VISITS During a routine prenatal visit:  You will be weighed to make sure you and the fetus are growing normally.  Your blood pressure will be taken.  Your abdomen will be measured to track your baby's growth.  The fetal heartbeat will be listened to.  Any test results from the previous visit will be discussed. Your health care provider may ask you:  How you are feeling.  If you are feeling the baby move.  If you have had any abnormal symptoms, such as  leaking fluid, bleeding, severe headaches, or abdominal cramping.  If you have any questions. Other tests that may be performed during your second trimester include:  Blood tests that check for:  Low iron levels (anemia).  Gestational diabetes (between 24 and 28 weeks).  Rh antibodies.  Urine tests to check for infections, diabetes, or protein in the urine.  An ultrasound to confirm the proper growth and development of the baby.  An amniocentesis to check for possible genetic problems.  Fetal screens for spina bifida and Down syndrome. HOME CARE INSTRUCTIONS   Avoid all smoking, herbs, alcohol, and unprescribed drugs. These chemicals affect the formation and growth of the baby.  Follow your health care provider's instructions regarding medicine use. There are medicines that are either safe or unsafe to take during pregnancy.  Exercise only as directed by your health care provider. Experiencing uterine cramps is a good sign to stop exercising.  Continue to eat regular, healthy meals.  Wear a good support bra for breast tenderness.  Do not use hot tubs, steam rooms, or saunas.  Wear your seat belt at all times when driving.  Avoid raw meat, uncooked cheese, cat litter boxes, and soil used by cats. These carry germs that can cause birth defects in the baby.  Take your prenatal vitamins.  Try taking a stool softener (if your health care provider approves) if you develop constipation. Eat more high-fiber foods, such as fresh vegetables or fruit and whole grains. Drink plenty of fluids to keep your urine clear or pale yellow.  Take warm sitz baths to soothe any pain or discomfort caused by hemorrhoids. Use hemorrhoid cream if your health care provider approves.  If you develop varicose veins, wear support hose. Elevate your feet for 15 minutes, 3-4 times a day. Limit salt in your diet.  Avoid heavy lifting, wear low heel shoes, and practice good posture.  Rest with your legs  elevated if you have leg cramps or low back pain.  Visit your dentist if you have not gone yet during your pregnancy. Use a soft toothbrush to brush your teeth and be gentle when you floss.  A sexual relationship may be continued unless your health care provider directs you otherwise.  Continue to go to all your prenatal visits as directed by your health care provider. SEEK MEDICAL CARE IF:   You have dizziness.  You have mild pelvic cramps, pelvic pressure, or nagging pain in the abdominal area.  You have persistent nausea, vomiting, or diarrhea.  You have a bad smelling vaginal discharge.  You have pain with urination. SEEK IMMEDIATE MEDICAL CARE IF:   You have a fever.  You are leaking fluid from your vagina.  You have spotting or bleeding from your vagina.  You have severe abdominal cramping or pain.  You have rapid weight gain or loss.  You have shortness of breath with chest pain.  You notice sudden or extreme swelling of your face, hands, ankles, feet, or legs.  You have not felt your baby move in over an hour.  You have severe headaches that do not go away with medicine.  You have vision changes. Document Released: 12/09/2001 Document Revised: 12/20/2013 Document Reviewed: 02/15/2013 [facility] Patient Information 2015 [place]. This information is not intended to replace advice given to you by your health care provider. Make sure you discuss any questions you have with your health care provider.

## 2016-12-12 ENCOUNTER — Telehealth: Payer: Self-pay | Admitting: Women's Health

## 2016-12-12 MED ORDER — FERROUS SULFATE 325 (65 FE) MG PO TABS: 325.0000 mg | ORAL_TABLET | Freq: Two times a day (BID) | ORAL | 3 refills | Status: DC

## 2016-12-12 NOTE — Telephone Encounter (Signed)
FYI: [facility] called yesterday with low HgB 10.2

## 2016-12-12 NOTE — Telephone Encounter (Signed)
Informed patient of low hgb and iron prescription was sent to pharmacy. Encouraged her to increase her diet with iron rich foods. Verbalized understanding

## 2016-12-26 ENCOUNTER — Other Ambulatory Visit: Payer: Self-pay | Admitting: Women's Health

## 2016-12-26 DIAGNOSIS — O24419 Gestational diabetes mellitus in pregnancy, unspecified control: Secondary | ICD-10-CM

## 2016-12-26 DIAGNOSIS — IMO0002 Reserved for concepts with insufficient information to code with codable children: Secondary | ICD-10-CM

## 2016-12-26 DIAGNOSIS — O10919 Unspecified pre-existing hypertension complicating pregnancy, unspecified trimester: Secondary | ICD-10-CM

## 2016-12-26 DIAGNOSIS — Z0489 Encounter for examination and observation for other specified reasons: Secondary | ICD-10-CM

## 2016-12-30 ENCOUNTER — Ambulatory Visit (INDEPENDENT_AMBULATORY_CARE_PROVIDER_SITE_OTHER): Payer: PRIVATE HEALTH INSURANCE

## 2016-12-30 ENCOUNTER — Other Ambulatory Visit: Payer: PRIVATE HEALTH INSURANCE

## 2016-12-30 ENCOUNTER — Ambulatory Visit (INDEPENDENT_AMBULATORY_CARE_PROVIDER_SITE_OTHER): Payer: PRIVATE HEALTH INSURANCE | Admitting: Obstetrics & Gynecology

## 2016-12-30 ENCOUNTER — Encounter: Payer: Self-pay | Admitting: Obstetrics & Gynecology

## 2016-12-30 VITALS — BP 118/69 | HR 82 | Wt 251.0 lb

## 2016-12-30 DIAGNOSIS — IMO0002 Reserved for concepts with insufficient information to code with codable children: Secondary | ICD-10-CM

## 2016-12-30 DIAGNOSIS — O24419 Gestational diabetes mellitus in pregnancy, unspecified control: Secondary | ICD-10-CM

## 2016-12-30 DIAGNOSIS — Z3A28 28 weeks gestation of pregnancy: Secondary | ICD-10-CM | POA: Diagnosis not present

## 2016-12-30 DIAGNOSIS — O10913 Unspecified pre-existing hypertension complicating pregnancy, third trimester: Secondary | ICD-10-CM | POA: Diagnosis not present

## 2016-12-30 DIAGNOSIS — O0990 Supervision of high risk pregnancy, unspecified, unspecified trimester: Secondary | ICD-10-CM

## 2016-12-30 DIAGNOSIS — O0993 Supervision of high risk pregnancy, unspecified, third trimester: Secondary | ICD-10-CM

## 2016-12-30 DIAGNOSIS — Z362 Encounter for other antenatal screening follow-up: Secondary | ICD-10-CM

## 2016-12-30 DIAGNOSIS — Z0489 Encounter for examination and observation for other specified reasons: Secondary | ICD-10-CM

## 2016-12-30 DIAGNOSIS — Z3483 Encounter for supervision of other normal pregnancy, third trimester: Secondary | ICD-10-CM

## 2016-12-30 DIAGNOSIS — O10919 Unspecified pre-existing hypertension complicating pregnancy, unspecified trimester: Secondary | ICD-10-CM

## 2016-12-30 NOTE — Progress Notes (Signed)
Fetal Surveillance Testing today:  Sonogram for EFW   High Risk Pregnancy Diagnosis(es):   CHTN, A1/B DM  G2P0101 321w1d Estimated Date of Delivery: 03/23/17  Blood pressure 118/69, pulse 82, weight 251 lb (113.9 kg).  Urinalysis: Negative   HPI: The patient is being seen today for ongoing management of as above. Today she reports CBG are good   BP weight and urine results all reviewed and noted. Patient reports good fetal movement, denies any bleeding and no rupture of membranes symptoms or regular contractions.  Fundal Height:  31 Fetal Heart rate:  144 Edema:  none  Patient is without complaints other than noted in her HPI. All questions were answered.  All lab and sonogram results have been reviewed. Comments:    Assessment:  1.  Pregnancy at 651w1d,  Estimated Date of Delivery: 03/23/17 :                          2.  CHTN                        3.  A1/B DM  Medication(s) Plans:  Baby asa, labetalol 200 bid  Treatment Plan:  Twice weekly surveillance, sonogram alternating with NST, induction at 39 weeks or as clinically indicated   Return in about 3 weeks (around 01/20/2017) for HROB. for appointment for high risk OB care  No orders of the defined types were placed in this encounter.  No orders of the defined types were placed in this encounter.

## 2016-12-30 NOTE — Progress Notes (Signed)
US 28+1 wks,cephalic,cx 3.3 cm,normal ov's bilat,post pl gr 0,afi 15 cm,fhr 137 bpm,efw 1281 g 61%,anatomy complete no obvious abnormalities seen

## 2016-12-31 LAB — CBC
Hematocrit: 31.5 % — ABNORMAL LOW (ref 34.0–46.6)
Hemoglobin: 10.3 g/dL — ABNORMAL LOW (ref 11.1–15.9)
MCH: 29.8 pg (ref 26.6–33.0)
MCHC: 32.7 g/dL (ref 31.5–35.7)
MCV: 91 fL (ref 79–97)
PLATELETS: 248 10*3/uL (ref 150–379)
RBC: 3.46 x10E6/uL — AB (ref 3.77–5.28)
RDW: 14.6 % (ref 12.3–15.4)
WBC: 12 10*3/uL — AB (ref 3.4–10.8)

## 2016-12-31 LAB — RPR: RPR: NONREACTIVE

## 2016-12-31 LAB — ANTIBODY SCREEN: ANTIBODY SCREEN: NEGATIVE

## 2016-12-31 LAB — HIV ANTIBODY (ROUTINE TESTING W REFLEX): HIV SCREEN 4TH GENERATION: NONREACTIVE

## 2017-01-08 ENCOUNTER — Inpatient Hospital Stay (HOSPITAL_COMMUNITY)
Admission: AD | Admit: 2017-01-08 | Discharge: 2017-01-08 | Disposition: A | Discharge status: Home / Self Care | Payer: PRIVATE HEALTH INSURANCE | Source: Ambulatory Visit | Attending: Obstetrics and Gynecology | Admitting: Obstetrics and Gynecology

## 2017-01-08 ENCOUNTER — Encounter (HOSPITAL_COMMUNITY): Payer: Self-pay | Admitting: *Deleted

## 2017-01-08 DIAGNOSIS — N949 Unspecified condition associated with female genital organs and menstrual cycle: Secondary | ICD-10-CM

## 2017-01-08 DIAGNOSIS — N9489 Other specified conditions associated with female genital organs and menstrual cycle: Secondary | ICD-10-CM | POA: Diagnosis present

## 2017-01-08 DIAGNOSIS — O99353 Diseases of the nervous system complicating pregnancy, third trimester: Secondary | ICD-10-CM | POA: Diagnosis not present

## 2017-01-08 DIAGNOSIS — Z3A29 29 weeks gestation of pregnancy: Secondary | ICD-10-CM | POA: Insufficient documentation

## 2017-01-08 DIAGNOSIS — O99283 Endocrine, nutritional and metabolic diseases complicating pregnancy, third trimester: Secondary | ICD-10-CM | POA: Diagnosis not present

## 2017-01-08 DIAGNOSIS — O0990 Supervision of high risk pregnancy, unspecified, unspecified trimester: Secondary | ICD-10-CM

## 2017-01-08 DIAGNOSIS — R102 Pelvic and perineal pain: Secondary | ICD-10-CM | POA: Diagnosis not present

## 2017-01-08 DIAGNOSIS — O26893 Other specified pregnancy related conditions, third trimester: Secondary | ICD-10-CM | POA: Insufficient documentation

## 2017-01-08 DIAGNOSIS — O163 Unspecified maternal hypertension, third trimester: Secondary | ICD-10-CM | POA: Insufficient documentation

## 2017-01-08 DIAGNOSIS — Z87891 Personal history of nicotine dependence: Secondary | ICD-10-CM | POA: Insufficient documentation

## 2017-01-08 DIAGNOSIS — G5603 Carpal tunnel syndrome, bilateral upper limbs: Secondary | ICD-10-CM | POA: Diagnosis not present

## 2017-01-08 DIAGNOSIS — E282 Polycystic ovarian syndrome: Secondary | ICD-10-CM | POA: Insufficient documentation

## 2017-01-08 DIAGNOSIS — Z79899 Other long term (current) drug therapy: Secondary | ICD-10-CM | POA: Diagnosis not present

## 2017-01-08 LAB — URINALYSIS, ROUTINE W REFLEX MICROSCOPIC
Bacteria, UA: NONE SEEN
Bilirubin Urine: NEGATIVE
GLUCOSE, UA: NEGATIVE mg/dL
HGB URINE DIPSTICK: NEGATIVE
Ketones, ur: 20 mg/dL — AB
Nitrite: NEGATIVE
PH: 6 (ref 5.0–8.0)
PROTEIN: NEGATIVE mg/dL
Specific Gravity, Urine: 1.018 (ref 1.005–1.030)

## 2017-01-08 NOTE — MAU Note (Signed)
PT  SAYS SHE  STARTED  FEELING  VAG PRESSURE   ON Tuesday -   AND  WORSE  NOW.   CRAMPING  STARTED   TODAY . [facility]NC-   FAMILY   [facility].     LAST   SEX-  LAST  NIGHT.      FEELS  BABY MOVING.     NO UNUSUAL  D/C

## 2017-01-08 NOTE — Discharge Instructions (Signed)

## 2017-01-08 NOTE — MAU Provider Note (Signed)
None     Chief Complaint:  Vaginal pressure and cramping.  Intercourse last night  Everlean Pattersonshley W Blume is  31 y.o. G2P0101 at 219w3d presents complaining of vaginal pressure since Tuesday.   Started feeling cramps today.  Denies discharge, bleeding, contractions.   Obstetrical/Gynecological History: OB History    Gravida Para Term Preterm AB Living   2 1   1   1    SAB TAB Ectopic Multiple Live Births           1     Past Medical History: Past Medical History:  Diagnosis Date  . 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 Carpal tunnel syndrome, bilateral   . Cervical high risk HPV (human papillomavirus) test positive 07/25/2015  . Complication of anesthesia    states woke up during T & A  . Hidradenitis 07/23/2015  . Hypertension    under control "most of the time"; has been on med. since 09/2014  . Neuromuscular disorder (HCC)   . Pain with urination 07/23/2015  . PCOS (polycystic ovarian syndrome)   . Seasonal allergies     Past Surgical History: Past Surgical History:  Procedure Laterality Date  . CARPAL TUNNEL RELEASE Right 02/22/2015   Procedure: RIGHT CARPAL TUNNEL RELEASE;  Surgeon: Betha LoaKevin Kuzma, MD;  Location: [facility];  Service: Orthopedics;  Laterality: Right;  . CESAREAN SECTION    . TONSILLECTOMY AND ADENOIDECTOMY    . WISDOM TOOTH EXTRACTION      Family History: Family History  Problem Relation Age of Onset  . Fibroids Mother   . Hypertension Mother   . Endometriosis Mother   . Hypertension Father   . Hypertension Brother   . Hypertension Maternal Grandmother   . Diabetes Maternal Grandmother   . COPD Maternal Grandmother   . Hypertension Maternal Grandfather   . Hypertension Paternal Grandmother   . Cancer Paternal Grandmother     breast  . Hypertension Paternal Grandfather     Social History: Social History  Substance Use Topics  . Smoking status: Former Smoker    Packs/day: 0.50    Years: 13.00    Types: Cigarettes    Quit date: 07/11/2016  .  Smokeless tobacco: Never Used  . Alcohol use No     Comment: 1-2 x/week and weekends    Allergies: No Known Allergies  Meds:  Prescriptions Prior to Admission  Medication Sig Dispense Refill Last Dose  . acetaminophen (TYLENOL) 500 MG tablet Take 1,000 mg by mouth every 6 (six) hours as needed for headache.   Not Taking  . BABY ASPIRIN PO Take by mouth.   Taking  . Doxylamine-Pyridoxine (DICLEGIS) 10-10 MG TBEC 2 tabs q hs, if sx persist add 1 tab q am on day 3, if sx persist add 1 tab q afternoon on day 4 100 tablet 6 Taking  . ferrous sulfate 325 (65 FE) MG tablet Take 1 tablet (325 mg total) by mouth 2 (two) times daily with a meal. 60 tablet 3 Taking  . labetalol (NORMODYNE) 200 MG tablet Take 1 tablet (200 mg total) by mouth 2 (two) times daily. 60 tablet 3 Taking  . omeprazole (PRILOSEC) 20 MG capsule Take 1 capsule (20 mg total) by mouth daily. 30 capsule 1 Taking  . Prenat w/o A-FeCbGl-DSS-FA-DHA (CITRANATAL ASSURE) 35-1 & 300 MG tablet Take 1 tablet by mouth daily. 60 tablet 11 Taking    Review of Systems   Constitutional: Negative for fever and chills Eyes: Negative for visual disturbances Respiratory: Negative  for shortness of breath, dyspnea Cardiovascular: Negative for chest pain or palpitations  Gastrointestinal: Negative for vomiting, diarrhea and constipation Genitourinary: Negative for dysuria and urgency Musculoskeletal: Negative for back pain, joint pain, myalgias.  Normal ROM  Neurological: Negative for dizziness and headaches    Physical Exam  Blood pressure 132/78, pulse 81, temperature 98.2 F (36.8 C), temperature source Oral, resp. rate 18, height 5\' 5"  (1.651 m), weight 113.4 kg (250 lb), SpO2 100 %. GENERAL: Well-developed, well-nourished female in no acute distress.  LUNGS: Clear to auscultation bilaterally.  HEART: Regular rate and rhythm. ABDOMEN: Soft, nontender, nondistended, gravid.  EXTREMITIES: Nontender, no edema, 2+ distal pulses. DTR's  2+ CERVICAL EXAM: Dilatation 0cm   Effacement 0%   Station out of pelvis   Presentation: unknown FHT:  Baseline rate 140 bpm   Variability moderate  Accelerations present   Decelerations none Contractions: Every 0 mins   Labs: Results for orders placed or performed during the hospital encounter of 01/08/17 (from the past 24 hour(s))  Urinalysis, Routine w reflex microscopic   Collection Time: 01/08/17  8:02 PM  Result Value Ref Range   Color, Urine YELLOW YELLOW   APPearance CLEAR CLEAR   Specific Gravity, Urine 1.018 1.005 - 1.030   pH 6.0 5.0 - 8.0   Glucose, UA NEGATIVE NEGATIVE mg/dL   Hgb urine dipstick NEGATIVE NEGATIVE   Bilirubin Urine NEGATIVE NEGATIVE   Ketones, ur 20 (A) NEGATIVE mg/dL   Protein, ur NEGATIVE NEGATIVE mg/dL   Nitrite NEGATIVE NEGATIVE   Leukocytes, UA TRACE (A) NEGATIVE   RBC / HPF 0-5 0 - 5 RBC/hpf   WBC, UA 0-5 0 - 5 WBC/hpf   Bacteria, UA NONE SEEN NONE SEEN   Squamous Epithelial / LPF 0-5 (A) NONE SEEN      Assessment: JENAN ELLEGOOD is  31 y.o. G2P0101 at [redacted]w[redacted]d presents with pelvic pressure.  Plan: DC ho me  CRESENZO-DISHMAN,Jalexus Brett 1/11/20189:41 PM

## 2017-01-08 NOTE — MAU Note (Signed)
Pt reports she has had a lot of pelvic pressure for the last 3 hours. Denies vaginal bleeding or ROM. Reports good fetal movement.

## 2017-01-20 ENCOUNTER — Encounter: Payer: Self-pay | Admitting: Women's Health

## 2017-01-20 ENCOUNTER — Ambulatory Visit (INDEPENDENT_AMBULATORY_CARE_PROVIDER_SITE_OTHER): Payer: PRIVATE HEALTH INSURANCE | Admitting: Women's Health

## 2017-01-20 VITALS — BP 138/76 | HR 80 | Wt 252.0 lb

## 2017-01-20 DIAGNOSIS — Z1389 Encounter for screening for other disorder: Secondary | ICD-10-CM

## 2017-01-20 DIAGNOSIS — Z331 Pregnant state, incidental: Secondary | ICD-10-CM

## 2017-01-20 DIAGNOSIS — O34219 Maternal care for unspecified type scar from previous cesarean delivery: Secondary | ICD-10-CM

## 2017-01-20 DIAGNOSIS — O24410 Gestational diabetes mellitus in pregnancy, diet controlled: Secondary | ICD-10-CM

## 2017-01-20 DIAGNOSIS — O10919 Unspecified pre-existing hypertension complicating pregnancy, unspecified trimester: Secondary | ICD-10-CM

## 2017-01-20 DIAGNOSIS — Z3A31 31 weeks gestation of pregnancy: Secondary | ICD-10-CM

## 2017-01-20 DIAGNOSIS — O10913 Unspecified pre-existing hypertension complicating pregnancy, third trimester: Secondary | ICD-10-CM

## 2017-01-20 DIAGNOSIS — O0993 Supervision of high risk pregnancy, unspecified, third trimester: Secondary | ICD-10-CM

## 2017-01-20 LAB — POCT URINALYSIS DIPSTICK
GLUCOSE UA: NEGATIVE
KETONES UA: NEGATIVE
Leukocytes, UA: NEGATIVE
NITRITE UA: NEGATIVE
Protein, UA: NEGATIVE
RBC UA: NEGATIVE

## 2017-01-20 NOTE — Progress Notes (Signed)
High Risk Pregnancy Diagnosis(es): CHTN, A1/BDM  G2P0101 10361w1d Estimated Date of Delivery: 03/23/17 BP 138/76   Pulse 80   Wt 252 lb (114.3 kg)   LMP  (LMP Unknown)   BMI 41.93 kg/m   Urinalysis: Negative HPI:  fbs 59-90, 2hr pp 75-201 (6 >120, 201 was an outlier- doesn't remember what she ate) BP, weight, and urine reviewed.  Reports good fm. Denies regular uc's, lof, vb, uti s/s. No complaints. Wants TOLAC, discussed risks/benefits, consent signed today  Fundal Height:  33 Fetal Heart rate:  133 Edema:  trace  Reviewed ptl s/s, fkc All questions were answered Assessment: 4561w1d CHTN, A1/BDM Medication(s) Plans:  Continue baby asa, labetalol 200mg  bid Treatment Plan: Growth u/s @  32, 35, 38wks      2x/wk testing @ 32wks  nst/sono      Deliver @ 39wks Follow up in 1wk for high-risk OB appt and bpp/dopp/growth u/s, begin 2x/wk testing

## 2017-01-20 NOTE — Patient Instructions (Signed)
Call the office ([phone]) or go to [facility] if:  You begin to have strong, frequent contractions  Your water breaks.  Sometimes it is a big gush of fluid, sometimes it is just a trickle that keeps getting your panties wet or running down your legs  You have vaginal bleeding.  It is normal to have a small amount of spotting if your cervix was checked.   You don't feel your baby moving like normal.  If you don't, get you something to eat and drink and lay down and focus on feeling your baby move.  You should feel at least 10 movements in 2 hours.  If you don't, you should call the office or go to [facility].     Preterm Labor and Birth Information The normal length of a pregnancy is 39-41 weeks. Preterm labor is when labor starts before 37 completed weeks of pregnancy. What are the risk factors for preterm labor? Preterm labor is more likely to occur in women who:  Have certain infections during pregnancy such as a bladder infection, sexually transmitted infection, or infection inside the uterus (chorioamnionitis).  Have a shorter-than-normal cervix.  Have gone into preterm labor before.  Have had surgery on their cervix.  Are younger than age 17 or older than age 35.  Are African American.  Are pregnant with twins or multiple babies (multiple gestation).  Take street drugs or smoke while pregnant.  Do not gain enough weight while pregnant.  Became pregnant shortly after having been pregnant. What are the symptoms of preterm labor? Symptoms of preterm labor include:  Cramps similar to those that can happen during a menstrual period. The cramps may happen with diarrhea.  Pain in the abdomen or lower back.  Regular uterine contractions that may feel like tightening of the abdomen.  A feeling of increased pressure in the pelvis.  Increased watery or bloody mucus discharge from the vagina.  Water breaking (ruptured amniotic sac). Why is it important to  recognize signs of preterm labor? It is important to recognize signs of preterm labor because babies who are born prematurely may not be fully developed. This can put them at an increased risk for:  Long-term (chronic) heart and lung problems.  Difficulty immediately after birth with regulating body systems, including blood sugar, body temperature, heart rate, and breathing rate.  Bleeding in the brain.  Cerebral palsy.  Learning difficulties.  Death. These risks are highest for babies who are born before 34 weeks of pregnancy. How is preterm labor treated? Treatment depends on the length of your pregnancy, your condition, and the health of your baby. It may involve:  Having a stitch (suture) placed in your cervix to prevent your cervix from opening too early (cerclage).  Taking or being given medicines, such as:  Hormone medicines. These may be given early in pregnancy to help support the pregnancy.  Medicine to stop contractions.  Medicines to help mature the baby's lungs. These may be prescribed if the risk of delivery is high.  Medicines to prevent your baby from developing cerebral palsy. If the labor happens before 34 weeks of pregnancy, you may need to stay in the hospital. What should I do if I think I am in preterm labor? If you think that you are going into preterm labor, call your health care provider right away. How can I prevent preterm labor in future pregnancies? To increase your chance of having a full-term pregnancy:  Do not use any tobacco products, such   as cigarettes, chewing tobacco, and e-cigarettes. If you need help quitting, ask your health care provider.  Do not use street drugs or medicines that have not been prescribed to you during your pregnancy.  Talk with your health care provider before taking any herbal supplements, even if you have been taking them regularly.  Make sure you gain a healthy amount of weight during your pregnancy.  Watch for  infection. If you think that you might have an infection, get it checked right away.  Make sure to tell your health care provider if you have gone into preterm labor before. This information is not intended to replace advice given to you by your health care provider. Make sure you discuss any questions you have with your health care provider. Document Released: 03/06/2004 Document Revised: 05/27/2016 Document Reviewed: 05/07/2016 Elsevier Interactive Patient Education  2017 [employer]  

## 2017-01-28 ENCOUNTER — Ambulatory Visit (INDEPENDENT_AMBULATORY_CARE_PROVIDER_SITE_OTHER): Payer: PRIVATE HEALTH INSURANCE | Admitting: Advanced Practice Midwife

## 2017-01-28 ENCOUNTER — Encounter: Payer: Self-pay | Admitting: Advanced Practice Midwife

## 2017-01-28 ENCOUNTER — Ambulatory Visit (INDEPENDENT_AMBULATORY_CARE_PROVIDER_SITE_OTHER): Payer: PRIVATE HEALTH INSURANCE

## 2017-01-28 VITALS — BP 126/78 | HR 88 | Wt 253.0 lb

## 2017-01-28 DIAGNOSIS — Z3A32 32 weeks gestation of pregnancy: Secondary | ICD-10-CM

## 2017-01-28 DIAGNOSIS — O0993 Supervision of high risk pregnancy, unspecified, third trimester: Secondary | ICD-10-CM

## 2017-01-28 DIAGNOSIS — O10919 Unspecified pre-existing hypertension complicating pregnancy, unspecified trimester: Secondary | ICD-10-CM

## 2017-01-28 DIAGNOSIS — O24410 Gestational diabetes mellitus in pregnancy, diet controlled: Secondary | ICD-10-CM | POA: Diagnosis not present

## 2017-01-28 DIAGNOSIS — O10913 Unspecified pre-existing hypertension complicating pregnancy, third trimester: Secondary | ICD-10-CM | POA: Diagnosis not present

## 2017-01-28 DIAGNOSIS — Z1389 Encounter for screening for other disorder: Secondary | ICD-10-CM

## 2017-01-28 DIAGNOSIS — Z331 Pregnant state, incidental: Secondary | ICD-10-CM

## 2017-01-28 DIAGNOSIS — O0990 Supervision of high risk pregnancy, unspecified, unspecified trimester: Secondary | ICD-10-CM

## 2017-01-28 LAB — POCT URINALYSIS DIPSTICK
Blood, UA: NEGATIVE
GLUCOSE UA: NEGATIVE
KETONES UA: NEGATIVE
Leukocytes, UA: NEGATIVE
Nitrite, UA: NEGATIVE
PROTEIN UA: NEGATIVE

## 2017-01-28 NOTE — Progress Notes (Signed)
US 32+2 wks,cephalic,fhr 147 bpm,bilat adnexa's wnl,post pl gr 3,afi 15 cm,BPP 8/8,RI .70,.73,efw 2239 g 67%

## 2017-01-28 NOTE — Progress Notes (Signed)
Fetal Surveillance Testing today:  US(bpp/dopplers/EFW)   High Risk Pregnancy Diagnosis(es):   CHTN, A1/B DM  G2P0101 5536w2d Estimated Date of Delivery: 03/23/17  There were no vitals taken for this visit.  Urinalysis: Negative   HPI: The patient is being seen today for ongoing management of the above. Today she reports no complaints   BP weight and urine results all reviewed and noted. Patient reports good fetal movement, denies any bleeding and no rupture of membranes symptoms or regular contractions.   US 32+2 wks,cephalic,fhr 147 bpm,bilat adnexa's wnl,post pl gr 3,afi 15 cm,BPP 8/8,RI .70,.73,efw 2239 g 67%  Edema:  no  Patient is without complaints other than noted in her HPI. All questions were answered.  All lab and sonogram results have been reviewed. Comments: normal US FBS: <90  2hr PP BS <120 unless she eats carbs, highes 156  Assessment:  1.  Pregnancy at 6336w2d,  Estimated Date of Delivery: 03/23/17 :                          2.  CHTN, good control                        3.  A1/B DM good control  Medication(s) Plans:  contnue Labetalol 200mg  BID and ASA 81mg   Treatment Plan:   Baby ASA @ 12wks: [ ]   Growth u/s @ 20, 28, 34, 38wks     2x/wk testing nst/sono @ 32wks    Deliver @  39wks (meds):   Return in about 5 days (around 02/02/2017) for NST/HROB and next thurs for US/HROB. for appointment for high risk OB care  No orders of the defined types were placed in this encounter.  No orders of the defined types were placed in this encounter.

## 2017-02-02 ENCOUNTER — Ambulatory Visit (INDEPENDENT_AMBULATORY_CARE_PROVIDER_SITE_OTHER): Payer: PRIVATE HEALTH INSURANCE | Admitting: Women's Health

## 2017-02-02 VITALS — BP 128/88 | HR 100 | Wt 253.2 lb

## 2017-02-02 DIAGNOSIS — Z1389 Encounter for screening for other disorder: Secondary | ICD-10-CM

## 2017-02-02 DIAGNOSIS — O10913 Unspecified pre-existing hypertension complicating pregnancy, third trimester: Secondary | ICD-10-CM

## 2017-02-02 DIAGNOSIS — Z3A33 33 weeks gestation of pregnancy: Secondary | ICD-10-CM

## 2017-02-02 DIAGNOSIS — O24419 Gestational diabetes mellitus in pregnancy, unspecified control: Secondary | ICD-10-CM | POA: Diagnosis not present

## 2017-02-02 DIAGNOSIS — O0993 Supervision of high risk pregnancy, unspecified, third trimester: Secondary | ICD-10-CM

## 2017-02-02 DIAGNOSIS — O10919 Unspecified pre-existing hypertension complicating pregnancy, unspecified trimester: Secondary | ICD-10-CM

## 2017-02-02 DIAGNOSIS — Z331 Pregnant state, incidental: Secondary | ICD-10-CM

## 2017-02-02 DIAGNOSIS — O99213 Obesity complicating pregnancy, third trimester: Secondary | ICD-10-CM | POA: Diagnosis not present

## 2017-02-02 MED ORDER — GLYBURIDE 5 MG PO TABS: 5.0000 mg | ORAL_TABLET | Freq: Two times a day (BID) | ORAL | 2 refills | Status: DC

## 2017-02-02 NOTE — Patient Instructions (Addendum)
Call the office ([phone]) or go to [facility] if:  You begin to have strong, frequent contractions  Your water breaks.  Sometimes it is a big gush of fluid, sometimes it is just a trickle that keeps getting your panties wet or running down your legs  You have vaginal bleeding.  It is normal to have a small amount of spotting if your cervix was checked.   You don't feel your baby moving like normal.  If you don't, get you something to eat and drink and lay down and focus on feeling your baby move.  You should feel at least 10 movements in 2 hours.  If you don't, you should call the office or go to [facility].     Preterm Labor and Birth Information The normal length of a pregnancy is 39-41 weeks. Preterm labor is when labor starts before 37 completed weeks of pregnancy. What are the risk factors for preterm labor? Preterm labor is more likely to occur in women who:  Have certain infections during pregnancy such as a bladder infection, sexually transmitted infection, or infection inside the uterus (chorioamnionitis).  Have a shorter-than-normal cervix.  Have gone into preterm labor before.  Have had surgery on their cervix.  Are younger than age 17 or older than age 35.  Are African American.  Are pregnant with twins or multiple babies (multiple gestation).  Take street drugs or smoke while pregnant.  Do not gain enough weight while pregnant.  Became pregnant shortly after having been pregnant. What are the symptoms of preterm labor? Symptoms of preterm labor include:  Cramps similar to those that can happen during a menstrual period. The cramps may happen with diarrhea.  Pain in the abdomen or lower back.  Regular uterine contractions that may feel like tightening of the abdomen.  A feeling of increased pressure in the pelvis.  Increased watery or bloody mucus discharge from the vagina.  Water breaking (ruptured amniotic sac). Why is it important to  recognize signs of preterm labor? It is important to recognize signs of preterm labor because babies who are born prematurely may not be fully developed. This can put them at an increased risk for:  Long-term (chronic) heart and lung problems.  Difficulty immediately after birth with regulating body systems, including blood sugar, body temperature, heart rate, and breathing rate.  Bleeding in the brain.  Cerebral palsy.  Learning difficulties.  Death. These risks are highest for babies who are born before 34 weeks of pregnancy. How is preterm labor treated? Treatment depends on the length of your pregnancy, your condition, and the health of your baby. It may involve:  Having a stitch (suture) placed in your cervix to prevent your cervix from opening too early (cerclage).  Taking or being given medicines, such as:  Hormone medicines. These may be given early in pregnancy to help support the pregnancy.  Medicine to stop contractions.  Medicines to help mature the baby's lungs. These may be prescribed if the risk of delivery is high.  Medicines to prevent your baby from developing cerebral palsy. If the labor happens before 34 weeks of pregnancy, you may need to stay in the hospital. What should I do if I think I am in preterm labor? If you think that you are going into preterm labor, call your health care provider right away. How can I prevent preterm labor in future pregnancies? To increase your chance of having a full-term pregnancy:  Do not use any tobacco products, such   cigarettes, chewing tobacco, and e-cigarettes. If you need help quitting, ask your health care provider.  Do not use street drugs or medicines that have not been prescribed to you during your pregnancy.  Talk with your health care provider before taking any herbal supplements, even if you have been taking them regularly.  Make sure you gain a healthy amount of weight during your pregnancy.  Watch for  infection. If you think that you might have an infection, get it checked right away.  Make sure to tell your health care provider if you have gone into preterm labor before. This information is not intended to replace advice given to you by your health care provider. Make sure you discuss any questions you have with your health care provider. Document Released: 03/06/2004 Document Revised: 05/27/2016 Document Reviewed: 05/07/2016 [employer] Interactive Patient Education  2017 [employer]   Safe Medications in Pregnancy   Acne: Benzoyl Peroxide Salicylic Acid  Backache/Headache: Tylenol: 2 regular strength every 4 hours OR              2 Extra strength every 6 hours  Colds/Coughs/Allergies: Benadryl (alcohol free) 25 mg every 6 hours as needed Breath right strips Claritin Cepacol throat lozenges Chloraseptic throat spray Cold-Eeze- up to three times per day Cough drops, alcohol free Flonase (by prescription only) Guaifenesin Mucinex Robitussin DM (plain only, alcohol free) Saline nasal spray/drops Sudafed (pseudoephedrine) & Actifed ** use only after [redacted] weeks gestation and if you do not have high blood pressure Tylenol Vicks Vaporub Zinc lozenges Zyrtec   Constipation: Colace Ducolax suppositories Fleet enema Glycerin suppositories Metamucil Milk of magnesia Miralax Senokot Smooth move tea  Diarrhea: Kaopectate Imodium A-D  *NO pepto Bismol  Hemorrhoids: Anusol Anusol HC Preparation H Tucks  Indigestion: Tums Maalox Mylanta Zantac  Pepcid  Insomnia: Benadryl (alcohol free) 25mg  every 6 hours as needed Tylenol PM Unisom, no Gelcaps  Leg Cramps: Tums MagGel  Nausea/Vomiting:  Bonine Dramamine Emetrol Ginger extract Sea bands Meclizine  Nausea medication to take during pregnancy:  Unisom (doxylamine succinate 25 mg tablets) Take one tablet daily at bedtime. If symptoms are not adequately controlled, the dose can be increased to a  maximum recommended dose of two tablets daily (1/2 tablet in the morning, 1/2 tablet mid-afternoon and one at bedtime). Vitamin B6 100mg  tablets. Take one tablet twice a day (up to 200 mg per day).  Skin Rashes: Aveeno products Benadryl cream or 25mg  every 6 hours as needed Calamine Lotion 1% Hydrocortisone cream  Yeast infection: Gyne-lotrimin 7 Monistat 7   **If taking multiple medications, please check labels to avoid duplicating the same active ingredients **take medication as directed on the label ** Do not exceed 4000 mg of tylenol in 24 hours **Do not take medications that contain aspirin or ibuprofen

## 2017-02-02 NOTE — Progress Notes (Signed)
High Risk Pregnancy Diagnosis(es): CHTN, A2/BDM (added med today) G2P0101 8539w0d Estimated Date of Delivery: 03/23/17 BP 128/88   Pulse 100   Wt 253 lb 3.2 oz (114.9 kg)   LMP  (LMP Unknown)   BMI 42.13 kg/m   Urinalysis: Negative HPI:  fbs 80-95 (3 >90), 2hr pp 71-166 (5 >120) BP, weight, and urine reviewed.  Reports good fm. Denies regular uc's, lof, vb, uti s/s. No complaints.  Fundal Height:  35 Fetal Heart rate:  135, reactive NST  Edema:  none  Reviewed ptl s/s, fkc All questions were answered Assessment: 7939w0d CHTN, A2/B DM (added med today) Medication(s) Plans:  STart glyburide 5mg  BID, continue labetalol 200mg  BID, baby asa Treatment Plan:  Growth u/s @ 35wks, 2x/wk testing nst/sono, IOL @ 39wks Follow up in 3d for high-risk OB appt and bpp/dopp u/s

## 2017-02-04 ENCOUNTER — Telehealth: Payer: Self-pay | Admitting: Women's Health

## 2017-02-04 NOTE — Telephone Encounter (Signed)
Patient called stating she had a severe episode of hypoglycemia last night (confusion, sweating). She started the Glyburide 5mg  Monday night, took it Tuesday morning but had the episode Tuesday night. She states she had been snacking and was cooking dinner when this occurred. She is very nervous about continuing the medication at this dosage and wants to know if it should be decreased. Please advise.

## 2017-02-04 NOTE — Telephone Encounter (Signed)
Informed patient that Katelyn Avila suggested to try only taking 2.5mg  in am and 2.5mg  in pm. Advised patient to eat a snack before bed, during the day and to eat something at the first sign her blood sugar may be dropping (weak, headache, etc.) Pt verbalized understanding.

## 2017-02-05 ENCOUNTER — Ambulatory Visit (INDEPENDENT_AMBULATORY_CARE_PROVIDER_SITE_OTHER): Payer: PRIVATE HEALTH INSURANCE | Admitting: Advanced Practice Midwife

## 2017-02-05 ENCOUNTER — Encounter: Payer: Self-pay | Admitting: Advanced Practice Midwife

## 2017-02-05 ENCOUNTER — Ambulatory Visit (INDEPENDENT_AMBULATORY_CARE_PROVIDER_SITE_OTHER): Payer: PRIVATE HEALTH INSURANCE

## 2017-02-05 VITALS — BP 120/80 | HR 82 | Wt 255.3 lb

## 2017-02-05 DIAGNOSIS — O10919 Unspecified pre-existing hypertension complicating pregnancy, unspecified trimester: Secondary | ICD-10-CM

## 2017-02-05 DIAGNOSIS — O0990 Supervision of high risk pregnancy, unspecified, unspecified trimester: Secondary | ICD-10-CM

## 2017-02-05 DIAGNOSIS — Z3A33 33 weeks gestation of pregnancy: Secondary | ICD-10-CM

## 2017-02-05 DIAGNOSIS — O0993 Supervision of high risk pregnancy, unspecified, third trimester: Secondary | ICD-10-CM

## 2017-02-05 DIAGNOSIS — O10913 Unspecified pre-existing hypertension complicating pregnancy, third trimester: Secondary | ICD-10-CM | POA: Diagnosis not present

## 2017-02-05 DIAGNOSIS — Z1389 Encounter for screening for other disorder: Secondary | ICD-10-CM

## 2017-02-05 DIAGNOSIS — O24419 Gestational diabetes mellitus in pregnancy, unspecified control: Secondary | ICD-10-CM

## 2017-02-05 DIAGNOSIS — Z331 Pregnant state, incidental: Secondary | ICD-10-CM

## 2017-02-05 LAB — POCT URINALYSIS DIPSTICK
Blood, UA: NEGATIVE
Glucose, UA: NEGATIVE
KETONES UA: NEGATIVE
Leukocytes, UA: NEGATIVE
Nitrite, UA: NEGATIVE
PROTEIN UA: NEGATIVE

## 2017-02-05 MED ORDER — OMEPRAZOLE 20 MG PO CPDR: 20.0000 mg | DELAYED_RELEASE_CAPSULE | Freq: Every day | ORAL | 1 refills | Status: DC

## 2017-02-05 NOTE — Progress Notes (Signed)
US 33+3 wks,cephalic,fhr 137 bpm,BPP 8/8,post pl gr 3,afi 11 cm,RI .72,.65 wnl

## 2017-02-05 NOTE — Progress Notes (Signed)
Fetal Surveillance Testing today:  US   High Risk Pregnancy Diagnosis(es):   CHTN, on labetalol  G2P0101 2254w3d Estimated Date of Delivery: 03/23/17  Blood pressure 120/80, pulse 82, weight 255 lb 4.8 oz (115.8 kg).  Urinalysis: Negative   HPI: The patient is being seen today for ongoing management of the above Today she reports FBS 70-80 and 2 hr pp <120 She was started on Glyburide 5mg  BID but BS dropped "severely" per pt.  She stopped that dosage and took 2.5 BID for the past few days, but has still felt it dropping.   BP weight and urine results all reviewed and noted. Patient reports good fetal movement, denies any bleeding and no rupture of membranes symptoms or regular contractions.   Edema:  no  Patient is without complaints other than noted in her HPI. All questions were answered.  All lab and sonogram results have been reviewed. Comments:  US 33+3 wks,cephalic,fhr 137 bpm,BPP 8/8,post pl gr 3,afi 11 cm,RI .72,.65 wnl Assessment:  1.  Pregnancy at 4854w3d,  Estimated Date of Delivery: 03/23/17 :                          2.  CHTN, excellent BP control                         3.  A2/B DM: hypoglycemic on meds;   Medication(s) Plans:  Continue labetalol 200mg  BID and glyburide 2.5mg  qhs (may need to play around w/dosage) .  Treatment Plan:    Growth u/s @  34, 38wks     2x/wk testing nst/sono @ 32wks    Deliver @  39wks (meds):   Return for Mondays for NST/HROB and Thrusdays for US/HROB. for appointment for high risk OB care  No orders of the defined types were placed in this encounter.  Orders Placed This Encounter  Procedures  . POCT urinalysis dipstick   '

## 2017-02-06 ENCOUNTER — Encounter (HOSPITAL_COMMUNITY): Payer: Self-pay

## 2017-02-06 ENCOUNTER — Inpatient Hospital Stay (HOSPITAL_COMMUNITY)
Admission: AD | Admit: 2017-02-06 | Discharge: 2017-02-06 | Disposition: A | Discharge status: Home / Self Care | Payer: PRIVATE HEALTH INSURANCE | Source: Ambulatory Visit | Attending: Obstetrics & Gynecology | Admitting: Obstetrics & Gynecology

## 2017-02-06 DIAGNOSIS — O4703 False labor before 37 completed weeks of gestation, third trimester: Secondary | ICD-10-CM | POA: Diagnosis not present

## 2017-02-06 DIAGNOSIS — Z3A33 33 weeks gestation of pregnancy: Secondary | ICD-10-CM | POA: Diagnosis not present

## 2017-02-06 DIAGNOSIS — Z7982 Long term (current) use of aspirin: Secondary | ICD-10-CM | POA: Diagnosis not present

## 2017-02-06 DIAGNOSIS — O4700 False labor before 37 completed weeks of gestation, unspecified trimester: Secondary | ICD-10-CM

## 2017-02-06 DIAGNOSIS — Z79899 Other long term (current) drug therapy: Secondary | ICD-10-CM | POA: Diagnosis not present

## 2017-02-06 DIAGNOSIS — R109 Unspecified abdominal pain: Secondary | ICD-10-CM

## 2017-02-06 DIAGNOSIS — O26899 Other specified pregnancy related conditions, unspecified trimester: Secondary | ICD-10-CM

## 2017-02-06 DIAGNOSIS — Z87891 Personal history of nicotine dependence: Secondary | ICD-10-CM | POA: Diagnosis not present

## 2017-02-06 DIAGNOSIS — O479 False labor, unspecified: Secondary | ICD-10-CM

## 2017-02-06 LAB — URINALYSIS, ROUTINE W REFLEX MICROSCOPIC
Bilirubin Urine: NEGATIVE
GLUCOSE, UA: NEGATIVE mg/dL
HGB URINE DIPSTICK: NEGATIVE
KETONES UR: NEGATIVE mg/dL
LEUKOCYTES UA: NEGATIVE
Nitrite: NEGATIVE
PROTEIN: NEGATIVE mg/dL
Specific Gravity, Urine: 1.011 (ref 1.005–1.030)
pH: 7 (ref 5.0–8.0)

## 2017-02-06 LAB — WET PREP, GENITAL
Clue Cells Wet Prep HPF POC: NONE SEEN
SPERM: NONE SEEN
TRICH WET PREP: NONE SEEN
YEAST WET PREP: NONE SEEN

## 2017-02-06 NOTE — MAU Note (Signed)
Urine sent to lab 

## 2017-02-06 NOTE — MAU Provider Note (Signed)
Chief Complaint:  Abdominal Cramping   First Provider Initiated Contact with Patient 02/06/17 1754      HPI: Katelyn Avila is a 31 y.o. G2P0101 at [redacted]w[redacted]d who presents to maternity admissions reporting abdominal cramping.  Started this AM, getting worse throughout the day. Felt constipation like but unable to have a stool. Had a BM yesterday, was normal and soft, usually goes in the evening. No suprapubic pressure, completely voids, no dysuria. Slightly increased urinary frequency. Did have intercourse last night. Barely drinks fluid.   Denies contractions, leakage of fluid or vaginal bleeding. Good fetal movement. Denies HA, changes in vision, RUQ/epigastric pain.  Pregnancy Course:  H/o preterm delivery 2/2 severe preeclampsia, resulted in cesarean section. +GDMA2/B- glyburide.   Past Medical History: Past Medical History:  Diagnosis Date  . 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 Carpal tunnel syndrome, bilateral   . Cervical high risk HPV (human papillomavirus) test positive 07/25/2015  . Complication of anesthesia    states woke up during T & A  . Hidradenitis 07/23/2015  . Hypertension    under control "most of the time"; has been on med. since 09/2014  . Neuromuscular disorder (HCC)   . Pain with urination 07/23/2015  . PCOS (polycystic ovarian syndrome)   . Seasonal allergies     Past obstetric history: OB History  Gravida Para Term Preterm AB Living  2 1   1   1   SAB TAB Ectopic Multiple Live Births          1    # Outcome Date GA Lbr Len/2nd Weight Sex Delivery Anes PTL Lv  2 Current           1 Preterm 10/19/09 109w0d  6 lb 3 oz (2.807 kg) M CS-Unspec  N LIV      Past Surgical History: Past Surgical History:  Procedure Laterality Date  . CARPAL TUNNEL RELEASE Right 02/22/2015   Procedure: RIGHT CARPAL TUNNEL RELEASE;  Surgeon: Betha Loa, MD;  Location: [facility];  Service: Orthopedics;  Laterality: Right;  . CESAREAN SECTION    . TONSILLECTOMY AND ADENOIDECTOMY     . WISDOM TOOTH EXTRACTION       Family History: Family History  Problem Relation Age of Onset  . Fibroids Mother   . Hypertension Mother   . Endometriosis Mother   . Hypertension Father   . Hypertension Brother   . Hypertension Maternal Grandmother   . Diabetes Maternal Grandmother   . COPD Maternal Grandmother   . Hypertension Maternal Grandfather   . Hypertension Paternal Grandmother   . Cancer Paternal Grandmother     breast  . Hypertension Paternal Grandfather     Social History: Social History  Substance Use Topics  . Smoking status: Former Smoker    Packs/day: 0.50    Years: 13.00    Types: Cigarettes    Quit date: 07/11/2016  . Smokeless tobacco: Never Used  . Alcohol use No     Comment: 1-2 x/week and weekends    Allergies: No Known Allergies  Meds:  Prescriptions Prior to Admission  Medication Sig Dispense Refill Last Dose  . acetaminophen (TYLENOL) 500 MG tablet Take 1,000 mg by mouth every 6 (six) hours as needed for headache.   02/05/2017 at Unknown time  . aspirin EC 81 MG tablet Take 81 mg by mouth daily.   02/06/2017 at Unknown time  . ferrous sulfate 325 (65 FE) MG tablet Take 1 tablet (325 mg total) by mouth 2 (two)  times daily with a meal. 60 tablet 3 02/06/2017 at Unknown time  . glyBURIDE (DIABETA) 5 MG tablet Take 1 tablet (5 mg total) by mouth 2 (two) times daily with a meal. (Patient taking differently: Take 2.5 mg by mouth at bedtime. ) 60 tablet 2 02/05/2017 at Unknown time  . labetalol (NORMODYNE) 200 MG tablet Take 1 tablet (200 mg total) by mouth 2 (two) times daily. 60 tablet 3 02/06/2017 at 0930  . Prenat w/o A-FeCbGl-DSS-FA-DHA (CITRANATAL ASSURE) 35-1 & 300 MG tablet Take 1 tablet by mouth daily. 60 tablet 11 02/06/2017 at Unknown time  . pseudoephedrine (SUDAFED) 30 MG tablet Take 30 mg by mouth every 6 (six) hours as needed for congestion.   02/05/2017 at Unknown time  . Doxylamine-Pyridoxine (DICLEGIS) 10-10 MG TBEC 2 tabs q hs, if sx persist add 1  tab q am on day 3, if sx persist add 1 tab q afternoon on day 4 (Patient not taking: Reported on 02/05/2017) 100 tablet 6 Not Taking  . omeprazole (PRILOSEC) 20 MG capsule Take 1 capsule (20 mg total) by mouth daily. (Patient not taking: Reported on 02/06/2017) 30 capsule 1 Not Taking at Unknown time  . omeprazole (PRILOSEC) 20 MG capsule Take 1 capsule (20 mg total) by mouth daily. (Patient not taking: Reported on 02/06/2017) 30 capsule 1 Not Taking at Unknown time    I have reviewed patient's Past Medical Hx, Surgical Hx, Family Hx, Social Hx, medications and allergies.   ROS:  A comprehensive ROS was negative except per HPI.    Physical Exam   Patient Vitals for the past 24 hrs:  Temp Temp src Resp  02/06/17 1738 98 F (36.7 C) Oral 20   Constitutional: Well-developed, well-nourished female in no acute distress.  Cardiovascular: normal rate Respiratory: normal effort GI: Abd soft, non-tender, gravid appropriate for gestational age. Pos BS x 4 MS: Extremities nontender, no edema, normal ROM Neurologic: Alert and oriented x 4.  GU: Neg CVAT. Pelvic: NEFG, physiologic discharge, no blood noted on glove. No CMT SVE: closed/thick/high    Labs: Results for orders placed or performed during the hospital encounter of 02/06/17 (from the past 24 hour(s))  Urinalysis, Routine w reflex microscopic     Status: None   Collection Time: 02/06/17  5:00 PM  Result Value Ref Range   Color, Urine YELLOW YELLOW   APPearance CLEAR CLEAR   Specific Gravity, Urine 1.011 1.005 - 1.030   pH 7.0 5.0 - 8.0   Glucose, UA NEGATIVE NEGATIVE mg/dL   Hgb urine dipstick NEGATIVE NEGATIVE   Bilirubin Urine NEGATIVE NEGATIVE   Ketones, ur NEGATIVE NEGATIVE mg/dL   Protein, ur NEGATIVE NEGATIVE mg/dL   Nitrite NEGATIVE NEGATIVE   Leukocytes, UA NEGATIVE NEGATIVE  Wet prep, genital     Status: Abnormal   Collection Time: 02/06/17  6:19 PM  Result Value Ref Range   Yeast Wet Prep HPF POC NONE SEEN NONE SEEN    Trich, Wet Prep NONE SEEN NONE SEEN   Clue Cells Wet Prep HPF POC NONE SEEN NONE SEEN   WBC, Wet Prep HPF POC FEW (A) NONE SEEN   Sperm NONE SEEN     Imaging:  US Ob Follow Up  Result Date: 01/28/2017 FOLLOW UP SONOGRAM NEFTALY INZUNZA is in the office for a follow up sonogram for EFW,BPP and cord doppler. She is a 31 y.o. year old G3P0101 with Estimated Date of Delivery: 03/23/17 by early ultrasound now at  [redacted]w[redacted]d weeks gestation. Thus  far the pregnancy has been complicated by CHTN,GDM A1. GESTATION: SINGLETON PRESENTATION: cephalic FETAL ACTIVITY:          Heart rate         147          The fetus is active. AMNIOTIC FLUID: The amniotic fluid volume is  normal, 15 cm. PLACENTA LOCALIZATION:  posterior GRADE 3 CERVIX: Limited view ADNEXA: wnl GESTATIONAL AGE AND  BIOMETRICS: Gestational criteria: Estimated Date of Delivery: 03/23/17 by early ultrasound now at [redacted]w[redacted]d Previous Scans:4          BIPARIETAL DIAMETER           8.57 cm         34+4 weeks   94% HEAD CIRCUMFERENCE           31.41 cm         35+2 weeks   86% ABDOMINAL CIRCUMFERENCE           29.83 cm         33+6 weeks   87% FEMUR LENGTH           6.32 cm         32+5 weeks     48%                                                       AVERAGE EGA(BY THIS SCAN):  34 weeks                                                 ESTIMATED FETAL WEIGHT:       2239  grams, 67 % ANATOMICAL SURVEY                                                                            COMMENTS CEREBRAL VENTRICLES yes normal  CHOROID PLEXUS yes normal                  NASAL BONE yes normal      FACIAL PROFILE yes normal          DIAPHRAGM yes normal  STOMACH yes normal  RENAL REGION yes normal  BLADDER yes normal      3 VESSEL CORD yes normal              GENITALIA yes normal female     SUSPECTED ABNORMALITIES:  no QUALITY OF SCAN: satisfactory TECHNICIAN COMMENTS: [place] 32+2 wks,cephalic,fhr 147 bpm,bilat adnexa's wnl,post pl gr 3,afi 15 cm,BPP 8/8,RI .70,.73,efw 2239 g 67% A copy of  this report including all images has been saved and backed up to a second source for retrieval if needed. All measures and details of the anatomical scan, placentation, fluid volume and pelvic anatomy are contained in that report. Amber Flora Lipps 01/28/2017 4:20 PM Clinical Impression and recommendations: I have reviewed the sonogram results above, combined with the patient's current clinical course, below are  my impressions and any appropriate recommendations for management based on the sonographic findings. 1.  [id number] Estimated Date of Delivery: 03/23/17 by serial sonographic evaluations 2.  Fetal sonographic surveillance findings: a). Normal fluid volume b). Normal antepartum fetal assessment with BPP 8/8 c). Normal fetal Doppler ratios with consistent diastolic flow d). Normal growth percentile with appropriate interval growth, 67% 3.  Normal general sonographic findings Recommend continued prenatal evaluations and care based on this sonogram and as clinically indicated from the patient's clinical course. Lazaro ArmsURE,LUTHER H 01/28/2017 4:28 PM   [place] Fetal Bpp Wo Non Stress  Result Date: 01/28/2017 FOLLOW UP SONOGRAM Everlean Pattersonshley W Boening is in the office for a follow up sonogram for EFW,BPP and cord doppler. She is a 31 y.o. year old 812P0101 with Estimated Date of Delivery: 03/23/17 by early ultrasound now at  3052w2d weeks gestation. Thus far the pregnancy has been complicated by CHTN,GDM A1. GESTATION: SINGLETON PRESENTATION: cephalic FETAL ACTIVITY:          Heart rate         147          The fetus is active. AMNIOTIC FLUID: The amniotic fluid volume is  normal, 15 cm. PLACENTA LOCALIZATION:  posterior GRADE 3 CERVIX: Limited view ADNEXA: wnl GESTATIONAL AGE AND  BIOMETRICS: Gestational criteria: Estimated Date of Delivery: 03/23/17 by early ultrasound now at 1852w2d Previous Scans:4          BIPARIETAL DIAMETER           8.57 cm         34+4 weeks   94% HEAD CIRCUMFERENCE           31.41 cm         35+2 weeks   86% ABDOMINAL  CIRCUMFERENCE           29.83 cm         33+6 weeks   87% FEMUR LENGTH           6.32 cm         32+5 weeks     48%                                                       AVERAGE EGA(BY THIS SCAN):  34 weeks                                                 ESTIMATED FETAL WEIGHT:       2239  grams, 67 % ANATOMICAL SURVEY                                                                            COMMENTS CEREBRAL VENTRICLES yes normal  CHOROID PLEXUS yes normal                  NASAL BONE yes normal      FACIAL PROFILE yes normal  DIAPHRAGM yes normal  STOMACH yes normal  RENAL REGION yes normal  BLADDER yes normal      3 VESSEL CORD yes normal              GENITALIA yes normal female     SUSPECTED ABNORMALITIES:  no QUALITY OF SCAN: satisfactory TECHNICIAN COMMENTS: [place] 32+2 wks,cephalic,fhr 147 bpm,bilat adnexa's wnl,post pl gr 3,afi 15 cm,BPP 8/8,RI .70,.73,efw 2239 g 67% A copy of this report including all images has been saved and backed up to a second source for retrieval if needed. All measures and details of the anatomical scan, placentation, fluid volume and pelvic anatomy are contained in that report. Amber Flora Lipps 01/28/2017 4:20 PM Clinical Impression and recommendations: I have reviewed the sonogram results above, combined with the patient's current clinical course, below are my impressions and any appropriate recommendations for management based on the sonographic findings. 1.  [id number] Estimated Date of Delivery: 03/23/17 by serial sonographic evaluations 2.  Fetal sonographic surveillance findings: a). Normal fluid volume b). Normal antepartum fetal assessment with BPP 8/8 c). Normal fetal Doppler ratios with consistent diastolic flow d). Normal growth percentile with appropriate interval growth, 67% 3.  Normal general sonographic findings Recommend continued prenatal evaluations and care based on this sonogram and as clinically indicated from the patient's clinical course. Lazaro Arms 01/28/2017 4:28  PM   [place] Ua Cord Doppler  Result Date: 01/28/2017 FOLLOW UP SONOGRAM SHAWNITA KRIZEK is in the office for a follow up sonogram for EFW,BPP and cord doppler. She is a 31 y.o. year old G68P0101 with Estimated Date of Delivery: 03/23/17 by early ultrasound now at  [redacted]w[redacted]d weeks gestation. Thus far the pregnancy has been complicated by CHTN,GDM A1. GESTATION: SINGLETON PRESENTATION: cephalic FETAL ACTIVITY:          Heart rate         147          The fetus is active. AMNIOTIC FLUID: The amniotic fluid volume is  normal, 15 cm. PLACENTA LOCALIZATION:  posterior GRADE 3 CERVIX: Limited view ADNEXA: wnl GESTATIONAL AGE AND  BIOMETRICS: Gestational criteria: Estimated Date of Delivery: 03/23/17 by early ultrasound now at [redacted]w[redacted]d Previous Scans:4          BIPARIETAL DIAMETER           8.57 cm         34+4 weeks   94% HEAD CIRCUMFERENCE           31.41 cm         35+2 weeks   86% ABDOMINAL CIRCUMFERENCE           29.83 cm         33+6 weeks   87% FEMUR LENGTH           6.32 cm         32+5 weeks     48%                                                       AVERAGE EGA(BY THIS SCAN):  34 weeks  ESTIMATED FETAL WEIGHT:       2239  grams, 67 % ANATOMICAL SURVEY                                                                            COMMENTS CEREBRAL VENTRICLES yes normal  CHOROID PLEXUS yes normal                  NASAL BONE yes normal      FACIAL PROFILE yes normal          DIAPHRAGM yes normal  STOMACH yes normal  RENAL REGION yes normal  BLADDER yes normal      3 VESSEL CORD yes normal              GENITALIA yes normal female     SUSPECTED ABNORMALITIES:  no QUALITY OF SCAN: satisfactory TECHNICIAN COMMENTS: [place] 32+2 wks,cephalic,fhr 147 bpm,bilat adnexa's wnl,post pl gr 3,afi 15 cm,BPP 8/8,RI .70,.73,efw 2239 g 67% A copy of this report including all images has been saved and backed up to a second source for retrieval if needed. All measures and details of the anatomical scan,  placentation, fluid volume and pelvic anatomy are contained in that report. Amber Flora Lipps 01/28/2017 4:20 PM Clinical Impression and recommendations: I have reviewed the sonogram results above, combined with the patient's current clinical course, below are my impressions and any appropriate recommendations for management based on the sonographic findings. 1.  [id number] Estimated Date of Delivery: 03/23/17 by serial sonographic evaluations 2.  Fetal sonographic surveillance findings: a). Normal fluid volume b). Normal antepartum fetal assessment with BPP 8/8 c). Normal fetal Doppler ratios with consistent diastolic flow d). Normal growth percentile with appropriate interval growth, 67% 3.  Normal general sonographic findings Recommend continued prenatal evaluations and care based on this sonogram and as clinically indicated from the patient's clinical course. Lazaro Arms 01/28/2017 4:28 PM    MAU Course: Wet Prep GC/CT PO fluids NST SVE - closed/thick/high FFN unable to be performed due to intercourse last night Unable to palpate any contractions  I personally reviewed the patient's NST today, found to be REACTIVE. 130 bpm, mod var, +accels, no decels. CTX: None   MDM: Plan of care reviewed with patient, including labs and tests ordered and medical treatment. Patient is not showing signs/symptoms of preterm labor. She had intercourse last night and is likely causing some cramping. No signs of infection.    Assessment: 1. False labor   2. Cramping affecting pregnancy, antepartum     Plan: Discharge home in stable condition.  Preterm Labor precautions and fetal kick counts Encouraged increased PO fluid intake.  Follow-up Information    [facility]. Go in 1 week(s).   Specialty:  Obstetrics and Gynecology Why:  Routine OB care Contact information: [address] [place] [address] [phone]          Allergies as of 02/06/2017   No Known Allergies      Medication List    STOP taking these medications   Doxylamine-Pyridoxine 10-10 MG Tbec Commonly known as:  DICLEGIS   omeprazole 20 MG capsule Commonly known as:  PRILOSEC     TAKE these medications  acetaminophen 500 MG tablet Commonly known as:  TYLENOL Take 1,000 mg by mouth every 6 (six) hours as needed for headache.   aspirin EC 81 MG tablet Take 81 mg by mouth daily.   CITRANATAL ASSURE 35-1 & 300 MG tablet Take 1 tablet by mouth daily.   ferrous sulfate 325 (65 FE) MG tablet Take 1 tablet (325 mg total) by mouth 2 (two) times daily with a meal.   glyBURIDE 5 MG tablet Commonly known as:  DIABETA Take 1 tablet (5 mg total) by mouth 2 (two) times daily with a meal. What changed:  how much to take  when to take this   labetalol 200 MG tablet Commonly known as:  NORMODYNE Take 1 tablet (200 mg total) by mouth 2 (two) times daily.   pseudoephedrine 30 MG tablet Commonly known as:  SUDAFED Take 30 mg by mouth every 6 (six) hours as needed for congestion.       Jen Mow, DO OB Fellow [facility], [facility] 02/06/2017 7:31 PM

## 2017-02-06 NOTE — Progress Notes (Addendum)
G2P1 @ 33.[redacted] wksga. Presents to triage for lower abdominal menstral like cramps that started around 10:00 this morning with activity. States can't stay still and probable did too much things around the house. Denies LOF or bleeding. +FM. EFM applied. VSS. See flow sheet for details.   Hx of c/s for failed induction of 15 hrs. and pre-E. Plan for VBAC.   GDM initially diet controlled and on PO glyberide just started past Monday.   1800: Provider at bs. wetprep and CG collected. SVE closed  1844: urinalysis ordered. Lab notified to process.

## 2017-02-06 NOTE — Discharge Instructions (Signed)
Braxton Hicks Contractions °Contractions of the uterus can occur throughout pregnancy. Contractions are not always a sign that you are in labor.  °WHAT ARE BRAXTON HICKS CONTRACTIONS?  °Contractions that occur before labor are called Braxton Hicks contractions, or false labor. Toward the end of pregnancy (32-34 weeks), these contractions can develop more often and may become more forceful. This is not true labor because these contractions do not result in opening (dilatation) and thinning of the cervix. They are sometimes difficult to tell apart from true labor because these contractions can be forceful and people have different pain tolerances. You should not feel embarrassed if you go to the hospital with false labor. Sometimes, the only way to tell if you are in true labor is for your health care provider to look for changes in the cervix. °If there are no prenatal problems or other health problems associated with the pregnancy, it is completely safe to be sent home with false labor and await the onset of true labor. °HOW CAN YOU TELL THE DIFFERENCE BETWEEN TRUE AND FALSE LABOR? °False Labor  °· The contractions of false labor are usually shorter and not as hard as those of true labor.   °· The contractions are usually irregular.   °· The contractions are often felt in the front of the lower abdomen and in the groin.   °· The contractions may go away when you walk around or change positions while lying down.   °· The contractions get weaker and are shorter lasting as time goes on.   °· The contractions do not usually become progressively stronger, regular, and closer together as with true labor.   °True Labor  °· Contractions in true labor last 30-70 seconds, become very regular, usually become more intense, and increase in frequency.   °· The contractions do not go away with walking.   °· The discomfort is usually felt in the top of the uterus and spreads to the lower abdomen and low back.   °· True labor can be  determined by your health care provider with an exam. This will show that the cervix is dilating and getting thinner.   °WHAT TO REMEMBER °· Keep up with your usual exercises and follow other instructions given by your health care provider.   °· Take medicines as directed by your health care provider.   °· Keep your regular prenatal appointments.   °· Eat and drink lightly if you think you are going into labor.   °· If Braxton Hicks contractions are making you uncomfortable:   °¨ Change your position from lying down or resting to walking, or from walking to resting.   °¨ Sit and rest in a tub of warm water.   °¨ Drink 2-3 glasses of water. Dehydration may cause these contractions.   °¨ Do slow and deep breathing several times an hour.   °WHEN SHOULD I SEEK IMMEDIATE MEDICAL CARE? °Seek immediate medical care if: °· Your contractions become stronger, more regular, and closer together.   °· You have fluid leaking or gushing from your vagina.   °· You have a fever.   °· You pass blood-tinged mucus.   °· You have vaginal bleeding.   °· You have continuous abdominal pain.   °· You have low back pain that you never had before.   °· You feel your baby's head pushing down and causing pelvic pressure.   °· Your baby is not moving as much as it used to.   °This information is not intended to replace advice given to you by your health care provider. Make sure you discuss any questions you have with your health care   provider. °Document Released: 12/15/2005 Document Revised: 04/07/2016 Document Reviewed: 09/26/2013 °Elsevier Interactive Patient Education © 2017 [employer] ° °

## 2017-02-09 ENCOUNTER — Ambulatory Visit (INDEPENDENT_AMBULATORY_CARE_PROVIDER_SITE_OTHER): Payer: PRIVATE HEALTH INSURANCE | Admitting: Obstetrics & Gynecology

## 2017-02-09 ENCOUNTER — Encounter: Payer: Self-pay | Admitting: Obstetrics & Gynecology

## 2017-02-09 VITALS — BP 130/90 | HR 76 | Wt 254.3 lb

## 2017-02-09 DIAGNOSIS — O10913 Unspecified pre-existing hypertension complicating pregnancy, third trimester: Secondary | ICD-10-CM

## 2017-02-09 DIAGNOSIS — O10919 Unspecified pre-existing hypertension complicating pregnancy, unspecified trimester: Secondary | ICD-10-CM

## 2017-02-09 DIAGNOSIS — O24419 Gestational diabetes mellitus in pregnancy, unspecified control: Secondary | ICD-10-CM | POA: Diagnosis not present

## 2017-02-09 DIAGNOSIS — Z3A34 34 weeks gestation of pregnancy: Secondary | ICD-10-CM

## 2017-02-09 DIAGNOSIS — Z1389 Encounter for screening for other disorder: Secondary | ICD-10-CM

## 2017-02-09 DIAGNOSIS — Z331 Pregnant state, incidental: Secondary | ICD-10-CM

## 2017-02-09 DIAGNOSIS — O0993 Supervision of high risk pregnancy, unspecified, third trimester: Secondary | ICD-10-CM

## 2017-02-09 LAB — GC/CHLAMYDIA PROBE AMP (~~LOC~~) NOT AT ARMC
CHLAMYDIA, DNA PROBE: NEGATIVE
Neisseria Gonorrhea: NEGATIVE

## 2017-02-09 LAB — POCT URINALYSIS DIPSTICK
GLUCOSE UA: NEGATIVE
Ketones, UA: NEGATIVE
Leukocytes, UA: NEGATIVE
NITRITE UA: NEGATIVE
Protein, UA: NEGATIVE
RBC UA: NEGATIVE

## 2017-02-09 NOTE — Progress Notes (Signed)
Fetal Surveillance Testing today:  Reactive NST   High Risk Pregnancy Diagnosis(es):   Class A2 DM, CHTN  G2P0101 743w0d Estimated Date of Delivery: 03/23/17  Blood pressure 130/90, pulse 76, weight 254 lb 4.8 oz (115.3 kg).  Urinalysis: Negative   HPI: The patient is being seen today for ongoing management of as above. Today she reports CBG are much better on glyburide 2.5 mg qhs   BP weight and urine results all reviewed and noted. Patient reports good fetal movement, denies any bleeding and no rupture of membranes symptoms or regular contractions.  Fundal Height:  37 Fetal Heart rate:  135 Edema:  none  Patient is without complaints other than noted in her HPI. All questions were answered.  All lab and sonogram results have been reviewed. Comments:    Assessment:  1.  Pregnancy at 10743w0d,  Estimated Date of Delivery: 03/23/17 :                          2.  Class A2 DM                        3.  CHTN  Medication(s) Plans:  Labetalol 200 mg BID, glyburide 2.5 mg qhs  Treatment Plan:  Twice weekly surveillance, sonogram alternating with NST, induction at 39 weeks or as clinically indicated   Return in about 3 days (around 02/12/2017) for BPP/sono, HROB. for appointment for high risk OB care  No orders of the defined types were placed in this encounter.  Orders Placed This Encounter  Procedures  . US Fetal BPP W/O Non Stress  . US UA Cord Doppler  . POCT urinalysis dipstick

## 2017-02-11 ENCOUNTER — Other Ambulatory Visit: Payer: Self-pay | Admitting: Obstetrics & Gynecology

## 2017-02-11 DIAGNOSIS — O10919 Unspecified pre-existing hypertension complicating pregnancy, unspecified trimester: Secondary | ICD-10-CM

## 2017-02-11 DIAGNOSIS — O24419 Gestational diabetes mellitus in pregnancy, unspecified control: Secondary | ICD-10-CM

## 2017-02-12 ENCOUNTER — Encounter: Payer: Self-pay | Admitting: Advanced Practice Midwife

## 2017-02-12 ENCOUNTER — Ambulatory Visit (INDEPENDENT_AMBULATORY_CARE_PROVIDER_SITE_OTHER): Payer: PRIVATE HEALTH INSURANCE | Admitting: Advanced Practice Midwife

## 2017-02-12 ENCOUNTER — Ambulatory Visit (INDEPENDENT_AMBULATORY_CARE_PROVIDER_SITE_OTHER): Payer: PRIVATE HEALTH INSURANCE

## 2017-02-12 VITALS — BP 126/84 | HR 85 | Wt 256.0 lb

## 2017-02-12 DIAGNOSIS — O10913 Unspecified pre-existing hypertension complicating pregnancy, third trimester: Secondary | ICD-10-CM | POA: Diagnosis not present

## 2017-02-12 DIAGNOSIS — Z3A34 34 weeks gestation of pregnancy: Secondary | ICD-10-CM

## 2017-02-12 DIAGNOSIS — O24419 Gestational diabetes mellitus in pregnancy, unspecified control: Secondary | ICD-10-CM

## 2017-02-12 DIAGNOSIS — O10919 Unspecified pre-existing hypertension complicating pregnancy, unspecified trimester: Secondary | ICD-10-CM

## 2017-02-12 DIAGNOSIS — Z331 Pregnant state, incidental: Secondary | ICD-10-CM

## 2017-02-12 DIAGNOSIS — Z1389 Encounter for screening for other disorder: Secondary | ICD-10-CM

## 2017-02-12 DIAGNOSIS — O0993 Supervision of high risk pregnancy, unspecified, third trimester: Secondary | ICD-10-CM

## 2017-02-12 DIAGNOSIS — O0990 Supervision of high risk pregnancy, unspecified, unspecified trimester: Secondary | ICD-10-CM

## 2017-02-12 LAB — POCT URINALYSIS DIPSTICK
Blood, UA: NEGATIVE
Glucose, UA: NEGATIVE
KETONES UA: NEGATIVE
LEUKOCYTES UA: NEGATIVE
Nitrite, UA: NEGATIVE
PROTEIN UA: NEGATIVE

## 2017-02-12 NOTE — Progress Notes (Signed)
US 34+3 wks,cephalic,post pl gr 3,RI .64,.65,WNL,normal ov's bilat,fhr 141 bpm,afi 14 bpm, BPP 8/8,EFW 2814  g 71%,AC 96%,BPD >98%

## 2017-02-12 NOTE — Progress Notes (Signed)
Fetal Surveillance Testing today:  US   High Risk Pregnancy Diagnosis(es):   CHTN, A2DM  [id number] 2955w3d Estimated Date of Delivery: 03/23/17  Blood pressure 126/84, pulse 85, weight 256 lb (116.1 kg).  Urinalysis: Negative   HPI: The patient is being seen today for ongoing management of the above . Today she reports feeling well   BP weight and urine results all reviewed and noted. Patient reports good fetal movement, denies any bleeding and no rupture of membranes symptoms or regular contractions.  FBS <90 and 2 hr pp all < 120, no hypoglycemia!  Patient is without complaints other than noted in her HPI. All questions were answered.  All lab and sonogram results have been reviewed. Comments:  US 34+3 wks,cephalic,post pl gr 3,RI .64,.65,WNL,normal ov's bilat,fhr 141 bpm,afi 14 bpm, BPP 8/8,EFW 2814  g 71%,AC 96%,BPD >98%  Assessment:  1.  Pregnancy at 7973w0d,  Estimated Date of Delivery: 03/23/17 :                          2.  Class A2 DM excellent conttrol                        3.  CHTN  Medication(s) Plans:  Labetalol 200 mg BID, glyburide 2.5 mg qhs  Treatment Plan:  Twice weekly surveillance, sonogram alternating with NST, induction at 39 weeks or as clinically indicated  Return for mondays NST/HROB thurs HROB/US. for appointment for high risk OB care  No orders of the defined types were placed in this encounter.  Orders Placed This Encounter  Procedures  . POCT urinalysis dipstick

## 2017-02-16 ENCOUNTER — Encounter: Payer: Self-pay | Admitting: Obstetrics & Gynecology

## 2017-02-16 ENCOUNTER — Ambulatory Visit (INDEPENDENT_AMBULATORY_CARE_PROVIDER_SITE_OTHER): Payer: PRIVATE HEALTH INSURANCE | Admitting: Obstetrics & Gynecology

## 2017-02-16 VITALS — BP 130/90 | HR 80 | Wt 259.0 lb

## 2017-02-16 DIAGNOSIS — Z1389 Encounter for screening for other disorder: Secondary | ICD-10-CM

## 2017-02-16 DIAGNOSIS — Z3A35 35 weeks gestation of pregnancy: Secondary | ICD-10-CM

## 2017-02-16 DIAGNOSIS — O0993 Supervision of high risk pregnancy, unspecified, third trimester: Secondary | ICD-10-CM

## 2017-02-16 DIAGNOSIS — O10913 Unspecified pre-existing hypertension complicating pregnancy, third trimester: Secondary | ICD-10-CM | POA: Diagnosis not present

## 2017-02-16 DIAGNOSIS — O24419 Gestational diabetes mellitus in pregnancy, unspecified control: Secondary | ICD-10-CM

## 2017-02-16 DIAGNOSIS — Z331 Pregnant state, incidental: Secondary | ICD-10-CM

## 2017-02-16 DIAGNOSIS — O10919 Unspecified pre-existing hypertension complicating pregnancy, unspecified trimester: Secondary | ICD-10-CM

## 2017-02-16 NOTE — Progress Notes (Signed)
Fetal Surveillance Testing today:  Reactive NST   High Risk Pregnancy Diagnosis(es):   CHTN, Class A2 DM, BPD >98%, previous C section  G2P0101 556w0d Estimated Date of Delivery: 03/23/17  Blood pressure 130/90, pulse 80, weight 259 lb (117.5 kg).  Urinalysis: Negative   HPI: The patient is being seen today for ongoing management of as above. Today she reports CBG are good on glyburide 2.5 mg qhs   BP weight and urine results all reviewed and noted. Patient reports good fetal movement, denies any bleeding and no rupture of membranes symptoms or regular contractions.  Fundal Height:   Fetal Heart rate:  140 Edema:  none  Patient is without complaints other than noted in her HPI. All questions were answered.  All lab and sonogram results have been reviewed. Comments:    Assessment:  1.  Pregnancy at 236w0d,  Estimated Date of Delivery: 03/23/17 :                          2.  CHTN                        3.  Class A2 DM  Medication(s) Plans:  Labetalol 200 BID, glyburide 2.5 mg qhs  Treatment Plan:  Twice weekly surveillance, sonogram alternating with NST, delivery at 39 weeks or as clinically indicated, most likely repeat C section   Return in about 3 days (around 02/19/2017) for BPP/sono, HROB, with Dr Despina HiddenEure. for appointment for high risk OB care  Meds ordered this encounter  Medications  . omeprazole (PRILOSEC) 10 MG capsule    Sig: Take 10 mg by mouth daily.   Orders Placed This Encounter  Procedures  . US Fetal BPP W/O Non Stress  . US UA Cord Doppler  . POCT urinalysis dipstick

## 2017-02-19 ENCOUNTER — Encounter: Payer: PRIVATE HEALTH INSURANCE | Admitting: Women's Health

## 2017-02-19 ENCOUNTER — Encounter: Payer: Self-pay | Admitting: Women's Health

## 2017-02-19 ENCOUNTER — Ambulatory Visit (INDEPENDENT_AMBULATORY_CARE_PROVIDER_SITE_OTHER): Payer: PRIVATE HEALTH INSURANCE | Admitting: Women's Health

## 2017-02-19 ENCOUNTER — Ambulatory Visit (INDEPENDENT_AMBULATORY_CARE_PROVIDER_SITE_OTHER): Payer: PRIVATE HEALTH INSURANCE

## 2017-02-19 VITALS — BP 134/88 | HR 76 | Wt 257.0 lb

## 2017-02-19 DIAGNOSIS — Z1389 Encounter for screening for other disorder: Secondary | ICD-10-CM

## 2017-02-19 DIAGNOSIS — O24419 Gestational diabetes mellitus in pregnancy, unspecified control: Secondary | ICD-10-CM

## 2017-02-19 DIAGNOSIS — O0990 Supervision of high risk pregnancy, unspecified, unspecified trimester: Secondary | ICD-10-CM

## 2017-02-19 DIAGNOSIS — O99213 Obesity complicating pregnancy, third trimester: Secondary | ICD-10-CM

## 2017-02-19 DIAGNOSIS — O0993 Supervision of high risk pregnancy, unspecified, third trimester: Secondary | ICD-10-CM

## 2017-02-19 DIAGNOSIS — Z3A35 35 weeks gestation of pregnancy: Secondary | ICD-10-CM

## 2017-02-19 DIAGNOSIS — O10913 Unspecified pre-existing hypertension complicating pregnancy, third trimester: Secondary | ICD-10-CM | POA: Diagnosis not present

## 2017-02-19 DIAGNOSIS — Z331 Pregnant state, incidental: Secondary | ICD-10-CM

## 2017-02-19 DIAGNOSIS — O10919 Unspecified pre-existing hypertension complicating pregnancy, unspecified trimester: Secondary | ICD-10-CM

## 2017-02-19 LAB — POCT URINALYSIS DIPSTICK
Glucose, UA: NEGATIVE
Ketones, UA: NEGATIVE
LEUKOCYTES UA: NEGATIVE
NITRITE UA: NEGATIVE
PROTEIN UA: NEGATIVE
RBC UA: NEGATIVE

## 2017-02-19 NOTE — Progress Notes (Signed)
US 35+3 wks,cephalic,fhr 132 bpm,post pl gr 3, afi 23.8 cm,BPP 8/8,RI .70,.73 90-95 th%

## 2017-02-19 NOTE — Patient Instructions (Signed)
Call the office ([phone]) or go to [facility] if:  You begin to have strong, frequent contractions  Your water breaks.  Sometimes it is a big gush of fluid, sometimes it is just a trickle that keeps getting your panties wet or running down your legs  You have vaginal bleeding.  It is normal to have a small amount of spotting if your cervix was checked.   You don't feel your baby moving like normal.  If you don't, get you something to eat and drink and lay down and focus on feeling your baby move.  You should feel at least 10 movements in 2 hours.  If you don't, you should call the office or go to [facility].    Call the office ([phone]) or go to [facility] for these signs of pre-eclampsia:  Severe headache that does not go away with Tylenol  Visual changes- seeing spots, double, blurred vision  Pain under your right breast or upper abdomen that does not go away with Tums or heartburn medicine  Nausea and/or vomiting  Severe swelling in your hands, feet, and face    For Headaches:   Stay well hydrated, drink enough water so that your urine is clear, sometimes if you are dehydrated you can get headaches  Eat small frequent meals and snacks, sometimes if you are hungry you can get headaches  Sometimes you get headaches during pregnancy from the pregnancy hormones  You can try tylenol (1-2 regular strength 325mg or 1-2 extra strength 500mg) as directed on the box. The least amount of medication that works is best.   Cool compresses (cool wet washcloth or ice pack) to area of head that is hurting  You can also try drinking a caffeinated drink to see if this will help  If not helping, try below:  For Prevention of Headaches/Migraines:  CoQ10 100mg three times daily  Vitamin B2 400mg daily  Magnesium Oxide 400-600mg daily  If You Get a Bad Headache/Migraine:  Benadryl 25mg   Magnesium Oxide  1 large Gatorade  2 extra strength Tylenol (1,000mg  total)  1 cup coffee or Coke  If this doesn't help please call us @ [phone]    

## 2017-02-19 NOTE — Progress Notes (Signed)
High Risk Pregnancy Diagnosis(es): CHTN, A2/BDM, BPD >98%, prev c/s, elevated dopplers, high normal AFI G2P0101 4146w3d Estimated Date of Delivery: 03/23/17 BP 134/88   Pulse 76   Wt 257 lb (116.6 kg)   LMP  (LMP Unknown)   BMI 42.77 kg/m   Urinalysis: Negative HPI:  FBS 75-93 (only 1 >90), 2hr pp 68-121 (only 1 >120). Mild frontal ha, has tried regular strength apap which hasn't really helped much. 'bumps' on lower abdomen. States plan is for RLTCS @ 39wks on 3/19 per [facility]- do not see it scheduled yet BP, weight, and urine reviewed.  Reports good fm. Denies regular uc's, lof, vb, uti s/s. No complaints. BP is normal today, no proteinuria  Fundal Height:  38 Fetal Heart rate:  132 u/s Edema:  +1, dependent edema in lower abdomen w/ indentations from pants, these are the 'bumps' she was referring to  Reviewed today's u/s: bpp 8/8, afi 23.8cm, dopp 0.70&0.73= 90-95%, no absent or reversed flow- images reviewed by [facility] as well. Discussed ptl s/s, pre-e s/s, fkc. Gave printed info on headache prevention/relief, discussed if severe/not r/b measures to call us or go to [facility]  All questions were answered Assessment: 1446w3d CHTN, A2/BDM, BPD >98%, prev c/s, elevated dopplers, high normal AFI Medication(s) Plans:  Continue labetalol 200mg  bid, glyburide 2.5mg  q hs Treatment Plan:  Continue 2x/wk testing nst alt w/ bpp/dopp, RLTCS @ 39wks or earlier if indicated Follow up on Monday for high-risk OB appt and NST and to schedule c/s

## 2017-02-23 ENCOUNTER — Ambulatory Visit (INDEPENDENT_AMBULATORY_CARE_PROVIDER_SITE_OTHER): Payer: PRIVATE HEALTH INSURANCE | Admitting: Obstetrics & Gynecology

## 2017-02-23 ENCOUNTER — Encounter: Payer: Self-pay | Admitting: Obstetrics & Gynecology

## 2017-02-23 VITALS — BP 138/90 | HR 80 | Wt 258.4 lb

## 2017-02-23 DIAGNOSIS — Z331 Pregnant state, incidental: Secondary | ICD-10-CM

## 2017-02-23 DIAGNOSIS — O403XX1 Polyhydramnios, third trimester, fetus 1: Secondary | ICD-10-CM

## 2017-02-23 DIAGNOSIS — O10919 Unspecified pre-existing hypertension complicating pregnancy, unspecified trimester: Secondary | ICD-10-CM

## 2017-02-23 DIAGNOSIS — Z1389 Encounter for screening for other disorder: Secondary | ICD-10-CM

## 2017-02-23 DIAGNOSIS — O0993 Supervision of high risk pregnancy, unspecified, third trimester: Secondary | ICD-10-CM

## 2017-02-23 DIAGNOSIS — O10913 Unspecified pre-existing hypertension complicating pregnancy, third trimester: Secondary | ICD-10-CM | POA: Diagnosis not present

## 2017-02-23 DIAGNOSIS — O24419 Gestational diabetes mellitus in pregnancy, unspecified control: Secondary | ICD-10-CM | POA: Diagnosis not present

## 2017-02-23 DIAGNOSIS — Z3A36 36 weeks gestation of pregnancy: Secondary | ICD-10-CM

## 2017-02-23 LAB — POCT URINALYSIS DIPSTICK
Blood, UA: NEGATIVE
GLUCOSE UA: NEGATIVE
LEUKOCYTES UA: NEGATIVE
Nitrite, UA: NEGATIVE

## 2017-02-23 NOTE — Addendum Note (Signed)
Addended by: Federico FlakeNES, PEGGY A on: 02/23/2017 04:59 PM   Modules accepted: Orders

## 2017-02-23 NOTE — Progress Notes (Signed)
Fetal Surveillance Testing today:  Reactive NST   High Risk Pregnancy Diagnosis(es):   CHTN, Class A2 DM, elevated Doppler ratios, Polyhydramnios  G2P0101 3483w0d Estimated Date of Delivery: 03/23/17  Blood pressure 138/90, pulse 80, weight 258 lb 6.4 oz (117.2 kg).  Urinalysis: Negative   HPI: The patient is being seen today for ongoing management of as above. Today she reports occasional headache no visual changes or other neurological complaints   BP weight and urine results all reviewed and noted. Patient reports good fetal movement, denies any bleeding and no rupture of membranes symptoms or regular contractions.  Fundal Height:  40 Fetal Heart rate:  140 Edema:  trace  Patient is without complaints other than noted in her HPI. All questions were answered.  All lab and sonogram results have been reviewed. Comments:    Assessment:  1.  Pregnancy at 483w0d,  Estimated Date of Delivery: 03/23/17 :                          2.  CHTN                        3.  Class A2 DM                        4.  Elevated Doppler flow ratios                        5.  Polyhydramnios                        6.  Suspected fetal macrosomia  Medication(s) Plans:  Labetalol 200 BID, glyburide 2.5 mg qhs  Treatment Plan:  Twice weekly surveillance, sonogram alternating with NST, induction at 39 weeks or as clinically indicated, scheduled for 3/19@1230    Return in about 3 days (around 02/26/2017) for BPP/sono, HROB. for appointment for high risk OB care  No orders of the defined types were placed in this encounter.  Orders Placed This Encounter  Procedures  . US Fetal BPP W/O Non Stress  . US UA Cord Doppler

## 2017-02-26 ENCOUNTER — Ambulatory Visit (INDEPENDENT_AMBULATORY_CARE_PROVIDER_SITE_OTHER): Payer: PRIVATE HEALTH INSURANCE | Admitting: Advanced Practice Midwife

## 2017-02-26 ENCOUNTER — Encounter: Payer: Self-pay | Admitting: Advanced Practice Midwife

## 2017-02-26 ENCOUNTER — Ambulatory Visit (INDEPENDENT_AMBULATORY_CARE_PROVIDER_SITE_OTHER): Payer: PRIVATE HEALTH INSURANCE

## 2017-02-26 VITALS — BP 132/86 | HR 88 | Wt 262.0 lb

## 2017-02-26 DIAGNOSIS — O403XX1 Polyhydramnios, third trimester, fetus 1: Secondary | ICD-10-CM

## 2017-02-26 DIAGNOSIS — O0993 Supervision of high risk pregnancy, unspecified, third trimester: Secondary | ICD-10-CM

## 2017-02-26 DIAGNOSIS — O10919 Unspecified pre-existing hypertension complicating pregnancy, unspecified trimester: Secondary | ICD-10-CM

## 2017-02-26 DIAGNOSIS — O409XX0 Polyhydramnios, unspecified trimester, not applicable or unspecified: Secondary | ICD-10-CM | POA: Insufficient documentation

## 2017-02-26 DIAGNOSIS — O10913 Unspecified pre-existing hypertension complicating pregnancy, third trimester: Secondary | ICD-10-CM

## 2017-02-26 DIAGNOSIS — O24419 Gestational diabetes mellitus in pregnancy, unspecified control: Secondary | ICD-10-CM | POA: Diagnosis not present

## 2017-02-26 DIAGNOSIS — Z1389 Encounter for screening for other disorder: Secondary | ICD-10-CM

## 2017-02-26 DIAGNOSIS — Z3A36 36 weeks gestation of pregnancy: Secondary | ICD-10-CM | POA: Diagnosis not present

## 2017-02-26 DIAGNOSIS — O1203 Gestational edema, third trimester: Secondary | ICD-10-CM

## 2017-02-26 DIAGNOSIS — O0990 Supervision of high risk pregnancy, unspecified, unspecified trimester: Secondary | ICD-10-CM

## 2017-02-26 DIAGNOSIS — Z331 Pregnant state, incidental: Secondary | ICD-10-CM

## 2017-02-26 LAB — POCT URINALYSIS DIPSTICK
Glucose, UA: NEGATIVE
Ketones, UA: NEGATIVE
NITRITE UA: NEGATIVE
Protein, UA: NEGATIVE
RBC UA: NEGATIVE

## 2017-02-26 NOTE — Progress Notes (Signed)
Fetal Surveillance Testing today:  BPP, dopplers   High Risk Pregnancy Diagnosis(es):   CHTN, A2/B DM  G2P0101 3032w3d Estimated Date of Delivery: 03/23/17  Blood pressure 132/86, pulse 88, weight 262 lb (118.8 kg).  Urinalysis: Negative   HPI: The patient is being seen today for ongoing management of the above. Today she reports FBS 61-80's and 2 hr PP usually <120   BP weight and urine results all reviewed and noted. Patient reports good fetal movement, denies any bleeding and no rupture of membranes symptoms or regular contractions.  Edema:  2+  Patient is without complaints other than noted in her HPI. All questions were answered.  All lab and sonogram results have been reviewed. Comments:  US 36+3 wks,cephalic,BPP 8/8,post pl gr 3,fhr 133 bpm,afi 27.7 cm polyhydramnios,RI .65,.60 wnl  Assessment:  1.  Pregnancy at 9432w3d,  Estimated Date of Delivery: 03/23/17 :  CHTN, good control on meds                        2.  A2DM good control                        3.  Elevated dopplers last week, normal today   4.  Polyhydramnios  Medication(s) Plans:  Continue labetalol 200mg  bid, glyburide 2.5mg  q hs and ASA 81mg   Treatment Plan:  Continue 2x/wk testing nst alt w/ bpp/dopp, RLTCS @ 39wks or earlier if indicated  Return in about 4 days (around 03/02/2017) for NST (will need US thursday). for appointment for high risk OB care  No orders of the defined types were placed in this encounter.  Orders Placed This Encounter  Procedures  . POCT urinalysis dipstick

## 2017-02-26 NOTE — Progress Notes (Signed)
US 36+3 wks,cephalic,BPP 8/8,post pl gr 3,fhr 133 bpm,afi 27.7 cm polyhydramnios,RI .65,.60 wnl

## 2017-03-02 ENCOUNTER — Other Ambulatory Visit: Payer: PRIVATE HEALTH INSURANCE | Admitting: Women's Health

## 2017-03-03 ENCOUNTER — Other Ambulatory Visit: Payer: Self-pay | Admitting: Advanced Practice Midwife

## 2017-03-03 DIAGNOSIS — O24410 Gestational diabetes mellitus in pregnancy, diet controlled: Secondary | ICD-10-CM

## 2017-03-03 DIAGNOSIS — O10919 Unspecified pre-existing hypertension complicating pregnancy, unspecified trimester: Secondary | ICD-10-CM

## 2017-03-04 ENCOUNTER — Ambulatory Visit (INDEPENDENT_AMBULATORY_CARE_PROVIDER_SITE_OTHER): Payer: PRIVATE HEALTH INSURANCE | Admitting: Women's Health

## 2017-03-04 ENCOUNTER — Encounter: Payer: Self-pay | Admitting: Women's Health

## 2017-03-04 VITALS — BP 120/80 | HR 84 | Wt 260.0 lb

## 2017-03-04 DIAGNOSIS — Z331 Pregnant state, incidental: Secondary | ICD-10-CM

## 2017-03-04 DIAGNOSIS — O10919 Unspecified pre-existing hypertension complicating pregnancy, unspecified trimester: Secondary | ICD-10-CM | POA: Diagnosis not present

## 2017-03-04 DIAGNOSIS — O24419 Gestational diabetes mellitus in pregnancy, unspecified control: Secondary | ICD-10-CM

## 2017-03-04 DIAGNOSIS — O99213 Obesity complicating pregnancy, third trimester: Secondary | ICD-10-CM

## 2017-03-04 DIAGNOSIS — Z3685 Encounter for antenatal screening for Streptococcus B: Secondary | ICD-10-CM

## 2017-03-04 DIAGNOSIS — O403XX0 Polyhydramnios, third trimester, not applicable or unspecified: Secondary | ICD-10-CM | POA: Diagnosis not present

## 2017-03-04 DIAGNOSIS — O1203 Gestational edema, third trimester: Secondary | ICD-10-CM

## 2017-03-04 DIAGNOSIS — Z3A37 37 weeks gestation of pregnancy: Secondary | ICD-10-CM

## 2017-03-04 DIAGNOSIS — O0993 Supervision of high risk pregnancy, unspecified, third trimester: Secondary | ICD-10-CM

## 2017-03-04 DIAGNOSIS — Z1389 Encounter for screening for other disorder: Secondary | ICD-10-CM

## 2017-03-04 LAB — POCT URINALYSIS DIPSTICK
GLUCOSE UA: NEGATIVE
Ketones, UA: NEGATIVE
Leukocytes, UA: NEGATIVE
Nitrite, UA: NEGATIVE
Protein, UA: NEGATIVE
RBC UA: NEGATIVE

## 2017-03-04 NOTE — Patient Instructions (Signed)
Call the office ([phone]) or go to [facility] if:  You begin to have strong, frequent contractions  Your water breaks.  Sometimes it is a big gush of fluid, sometimes it is just a trickle that keeps getting your panties wet or running down your legs  You have vaginal bleeding.  It is normal to have a small amount of spotting if your cervix was checked.   You don't feel your baby moving like normal.  If you don't, get you something to eat and drink and lay down and focus on feeling your baby move.  You should feel at least 10 movements in 2 hours.  If you don't, you should call the office or go to [facility].    Call the office ([phone]) or [facility] for these signs of pre-eclampsia:  Severe headache that does not go away with Tylenol  Visual changes- seeing spots, double, blurred vision  Pain under your right breast or upper abdomen that does not go away with Tums or heartburn medicine  Nausea and/or vomiting  Severe swelling in your hands, feet, and face      Braxton Hicks Contractions Contractions of the uterus can occur throughout pregnancy, but they are not always a sign that you are in labor. You may have practice contractions called Braxton Hicks contractions. These false labor contractions are sometimes confused with true labor. What are Braxton Hicks contractions? Braxton Hicks contractions are tightening movements that occur in the muscles of the uterus before labor. Unlike true labor contractions, these contractions do not result in opening (dilation) and thinning of the cervix. Toward the end of pregnancy (32-34 weeks), Braxton Hicks contractions can happen more often and may become stronger. These contractions are sometimes difficult to tell apart from true labor because they can be very uncomfortable. You should not feel embarrassed if you go to the hospital with false labor. Sometimes, the only way to tell if you are in true labor is for your  health care provider to look for changes in the cervix. The health care provider will do a physical exam and may monitor your contractions. If you are not in true labor, the exam should show that your cervix is not dilating and your water has not broken. If there are no prenatal problems or other health problems associated with your pregnancy, it is completely safe for you to be sent home with false labor. You may continue to have Braxton Hicks contractions until you go into true labor. How can I tell the difference between true labor and false labor?  Differences  False labor  Contractions last 30-70 seconds.: Contractions are usually shorter and not as strong as true labor contractions.  Contractions become very regular.: Contractions are usually irregular.  Discomfort is usually felt in the top of the uterus, and it spreads to the lower abdomen and low back.: Contractions are often felt in the front of the lower abdomen and in the groin.  Contractions do not go away with walking.: Contractions may go away when you walk around or change positions while lying down.  Contractions usually become more intense and increase in frequency.: Contractions get weaker and are shorter-lasting as time goes on.  The cervix dilates and gets thinner.: The cervix usually does not dilate or become thin. Follow these instructions at home:  Take over-the-counter and prescription medicines only as told by your health care provider.  Keep up with your usual exercises and follow other instructions from your health   care provider.  Eat and drink lightly if you think you are going into labor.  If Braxton Hicks contractions are making you uncomfortable:  Change your position from lying down or resting to walking, or change from walking to resting.  Sit and rest in a tub of warm water.  Drink enough fluid to keep your urine clear or pale yellow. Dehydration may cause these contractions.  Do slow and deep  breathing several times an hour.  Keep all follow-up prenatal visits as told by your health care provider. This is important. Contact a health care provider if:  You have a fever.  You have continuous pain in your abdomen. Get help right away if:  Your contractions become stronger, more regular, and closer together.  You have fluid leaking or gushing from your vagina.  You pass blood-tinged mucus (bloody show).  You have bleeding from your vagina.  You have low back pain that you never had before.  You feel your baby's head pushing down and causing pelvic pressure.  Your baby is not moving inside you as much as it used to. Summary  Contractions that occur before labor are called Braxton Hicks contractions, false labor, or practice contractions.  Braxton Hicks contractions are usually shorter, weaker, farther apart, and less regular than true labor contractions. True labor contractions usually become progressively stronger and regular and they become more frequent.  Manage discomfort from Braxton Hicks contractions by changing position, resting in a warm bath, drinking plenty of water, or practicing deep breathing. This information is not intended to replace advice given to you by your health care provider. Make sure you discuss any questions you have with your health care provider. Document Released: 12/15/2005 Document Revised: 11/03/2016 Document Reviewed: 11/03/2016 Elsevier Interactive Patient Education  2017 [employer]  

## 2017-03-04 NOTE — Progress Notes (Signed)
High Risk Pregnancy Diagnosis(es): CHTN, A2/BDM, Polyhydramnios G2P0101 529w2d Estimated Date of Delivery: 03/23/17 BP 120/80   Pulse 84   Wt 260 lb (117.9 kg)   LMP  (LMP Unknown)   BMI 43.27 kg/m   Urinalysis: Negative HPI:  Didn't bring log, reports all fbs normal, all 2hr pp normal except for one that was 200- but she ate a lot of stuff that she shouldn't. Some light pink spotting today w/ wiping, had sex last night. Denies abnormal d/c, itching/odor/irritation.  Had to cancel Mon 3/5 appt d/t son being sick w/ flu, was rescheduled for today- has appt for bpp/dopp u/s tomorrow BP, weight, and urine reviewed.  Reports good fm. Denies regular uc's, lof, vb, uti s/s.   Fundal Height:  40 Fetal Heart rate:  135, reactive NST Edema:  1+ Spec exam: cx visually closed, no bleeding noted, wet prep neg Declines SVE  Reviewed labor s/s, pre-e s/s, fkc All questions were answered Assessment: 4929w2d CHTN, A2/BDM, Polyhydramnios Medication(s) Plans:  Continue labetalol 200mg  bid, glyburide 2.5mg  qhs and asa 81mg  daily Treatment Plan:  2x/wk testing nst alt w/ bpp/dopp u/s, Repeat c/s @ 39wks or earlier if indicated Follow up tomorrow as already scheduled for high-risk OB appt and bpp/dopp u/s

## 2017-03-05 ENCOUNTER — Ambulatory Visit (INDEPENDENT_AMBULATORY_CARE_PROVIDER_SITE_OTHER): Payer: PRIVATE HEALTH INSURANCE | Admitting: Advanced Practice Midwife

## 2017-03-05 ENCOUNTER — Ambulatory Visit (INDEPENDENT_AMBULATORY_CARE_PROVIDER_SITE_OTHER): Payer: PRIVATE HEALTH INSURANCE

## 2017-03-05 ENCOUNTER — Encounter: Payer: Self-pay | Admitting: Advanced Practice Midwife

## 2017-03-05 VITALS — BP 148/84 | HR 84 | Wt 258.0 lb

## 2017-03-05 DIAGNOSIS — O24410 Gestational diabetes mellitus in pregnancy, diet controlled: Secondary | ICD-10-CM | POA: Diagnosis not present

## 2017-03-05 DIAGNOSIS — O10919 Unspecified pre-existing hypertension complicating pregnancy, unspecified trimester: Secondary | ICD-10-CM

## 2017-03-05 DIAGNOSIS — O401XX0 Polyhydramnios, first trimester, not applicable or unspecified: Secondary | ICD-10-CM

## 2017-03-05 DIAGNOSIS — O0993 Supervision of high risk pregnancy, unspecified, third trimester: Secondary | ICD-10-CM

## 2017-03-05 DIAGNOSIS — O10913 Unspecified pre-existing hypertension complicating pregnancy, third trimester: Secondary | ICD-10-CM

## 2017-03-05 DIAGNOSIS — Z331 Pregnant state, incidental: Secondary | ICD-10-CM

## 2017-03-05 DIAGNOSIS — O0990 Supervision of high risk pregnancy, unspecified, unspecified trimester: Secondary | ICD-10-CM

## 2017-03-05 DIAGNOSIS — Z3A37 37 weeks gestation of pregnancy: Secondary | ICD-10-CM

## 2017-03-05 DIAGNOSIS — Z1389 Encounter for screening for other disorder: Secondary | ICD-10-CM

## 2017-03-05 DIAGNOSIS — O24419 Gestational diabetes mellitus in pregnancy, unspecified control: Secondary | ICD-10-CM

## 2017-03-05 LAB — POCT URINALYSIS DIPSTICK
Blood, UA: NEGATIVE
Glucose, UA: NEGATIVE
LEUKOCYTES UA: NEGATIVE
Nitrite, UA: NEGATIVE
PROTEIN UA: NEGATIVE

## 2017-03-05 NOTE — Progress Notes (Signed)
US 37+3 wks,cephalic,BPP 8/8, fhr 136 bpm,afi 17.5 cm,post pl gr 3,normal ov's bilat,RI .60,.61 70TH%

## 2017-03-05 NOTE — Patient Instructions (Signed)
150/100 more than twice, increase Labetalol to 300mg  twice a day (1.5 tablets)  [facility] get a bad [facility] if your blood pressure is normal

## 2017-03-05 NOTE — Progress Notes (Signed)
Fetal Surveillance Testing today:  US   High Risk Pregnancy Diagnosis(es):   CHTN, A2DM  [id number] 7661w3d Estimated Date of Delivery: 03/23/17  Blood pressure (!) 148/84, pulse 84, weight 258 lb (117 kg).  Urinalysis: Negative   HPI: The patient is being seen today for ongoing management of the above. Today she reports BS good. No HA, RUQ pain.  Had PreX last pregnancy, "i know what a preeclampsia HA is"   BP weight and urine results all reviewed and noted. Patient reports good fetal movement, denies any bleeding and no rupture of membranes symptoms or regular contractions.    Patient is without complaints other than noted in her HPI. All questions were answered.  All lab and sonogram results have been reviewed. Comments:  US 37+3 wks,cephalic,BPP 8/8, fhr 136 bpm,afi 17.5 cm,post pl gr 3,normal ov's bilat,RI .60,.61 70TH%  Assessment:  1.  Pregnancy at 6661w3d,  Estimated Date of Delivery: 03/23/17 :  CHTN--BP up a little                        2.  A2DM, good control                        3.   Medication(s) Plans:    Continue labetalol 200mg  bid, glyburide 2.5mg  qhs and asa 81mg  daily.  Will increase labetalol to 300mg  BID if bp > 150/100 this weekend. (pt can chec at home) Treatment Plan:  2x/wk testing nst alt w/ bpp/dopp u/s, Repeat c/s @ 39wks or earlier if indicated Orders Placed This Encounter  Procedures  . Comprehensive metabolic panel  . CBC  . Protein / creatinine ratio, urine  . POCT urinalysis dipstick    Return in about 4 days (around 03/09/2017) for HROB, NST. for appointment for high risk OB care  No orders of the defined types were placed in this encounter.  Orders Placed This Encounter  Procedures  . Comprehensive metabolic panel  . CBC  . Protein / creatinine ratio, urine  . POCT urinalysis dipstick

## 2017-03-06 LAB — COMPREHENSIVE METABOLIC PANEL
A/G RATIO: 1.7 (ref 1.2–2.2)
ALBUMIN: 3.7 g/dL (ref 3.5–5.5)
ALT: 14 IU/L (ref 0–32)
AST: 17 IU/L (ref 0–40)
Alkaline Phosphatase: 111 IU/L (ref 39–117)
BILIRUBIN TOTAL: 0.2 mg/dL (ref 0.0–1.2)
BUN / CREAT RATIO: 16 (ref 9–23)
BUN: 10 mg/dL (ref 6–20)
CHLORIDE: 103 mmol/L (ref 96–106)
CO2: 21 mmol/L (ref 18–29)
Calcium: 9.3 mg/dL (ref 8.7–10.2)
Creatinine, Ser: 0.64 mg/dL (ref 0.57–1.00)
GFR, EST AFRICAN AMERICAN: 137 mL/min/{1.73_m2} (ref >59)
GFR, EST NON AFRICAN AMERICAN: 118 mL/min/{1.73_m2} (ref >59)
GLOBULIN, TOTAL: 2.2 g/dL (ref 1.5–4.5)
Glucose: 77 mg/dL (ref 65–99)
POTASSIUM: 4.8 mmol/L (ref 3.5–5.2)
SODIUM: 140 mmol/L (ref 134–144)
TOTAL PROTEIN: 5.9 g/dL — AB (ref 6.0–8.5)

## 2017-03-06 LAB — CBC
HEMOGLOBIN: 10.7 g/dL — AB (ref 11.1–15.9)
Hematocrit: 32.8 % — ABNORMAL LOW (ref 34.0–46.6)
MCH: 30.5 pg (ref 26.6–33.0)
MCHC: 32.6 g/dL (ref 31.5–35.7)
MCV: 93 fL (ref 79–97)
PLATELETS: 216 10*3/uL (ref 150–379)
RBC: 3.51 x10E6/uL — ABNORMAL LOW (ref 3.77–5.28)
RDW: 14.7 % (ref 12.3–15.4)
WBC: 12.8 10*3/uL — ABNORMAL HIGH (ref 3.4–10.8)

## 2017-03-06 LAB — STREP GP B NAA: STREP GROUP B AG: NEGATIVE

## 2017-03-06 LAB — PROTEIN / CREATININE RATIO, URINE
CREATININE, UR: 159.3 mg/dL
PROTEIN UR: 16.1 mg/dL
Protein/Creat Ratio: 101 mg/g creat (ref 0–200)

## 2017-03-09 ENCOUNTER — Other Ambulatory Visit: Payer: PRIVATE HEALTH INSURANCE | Admitting: Women's Health

## 2017-03-09 ENCOUNTER — Encounter (HOSPITAL_COMMUNITY): Payer: Self-pay

## 2017-03-10 ENCOUNTER — Other Ambulatory Visit: Payer: PRIVATE HEALTH INSURANCE

## 2017-03-10 ENCOUNTER — Encounter: Payer: Self-pay | Admitting: Advanced Practice Midwife

## 2017-03-10 ENCOUNTER — Other Ambulatory Visit: Payer: PRIVATE HEALTH INSURANCE | Admitting: Advanced Practice Midwife

## 2017-03-10 ENCOUNTER — Ambulatory Visit (INDEPENDENT_AMBULATORY_CARE_PROVIDER_SITE_OTHER): Payer: PRIVATE HEALTH INSURANCE | Admitting: Advanced Practice Midwife

## 2017-03-10 VITALS — BP 150/98 | HR 90 | Wt 259.0 lb

## 2017-03-10 DIAGNOSIS — Z331 Pregnant state, incidental: Secondary | ICD-10-CM

## 2017-03-10 DIAGNOSIS — Z3A38 38 weeks gestation of pregnancy: Secondary | ICD-10-CM

## 2017-03-10 DIAGNOSIS — O10913 Unspecified pre-existing hypertension complicating pregnancy, third trimester: Secondary | ICD-10-CM

## 2017-03-10 DIAGNOSIS — O24419 Gestational diabetes mellitus in pregnancy, unspecified control: Secondary | ICD-10-CM | POA: Diagnosis not present

## 2017-03-10 DIAGNOSIS — O1203 Gestational edema, third trimester: Secondary | ICD-10-CM | POA: Diagnosis not present

## 2017-03-10 DIAGNOSIS — O0993 Supervision of high risk pregnancy, unspecified, third trimester: Secondary | ICD-10-CM

## 2017-03-10 DIAGNOSIS — O10919 Unspecified pre-existing hypertension complicating pregnancy, unspecified trimester: Secondary | ICD-10-CM

## 2017-03-10 DIAGNOSIS — Z1389 Encounter for screening for other disorder: Secondary | ICD-10-CM

## 2017-03-10 LAB — POCT URINALYSIS DIPSTICK
Glucose, UA: NEGATIVE
KETONES UA: NEGATIVE
Leukocytes, UA: NEGATIVE
Nitrite, UA: NEGATIVE
Protein, UA: NEGATIVE
RBC UA: NEGATIVE

## 2017-03-10 NOTE — Patient Instructions (Signed)
Increase Labetalol to 300mg  twice a day and continue Glyburide 2.5mg  twice a days\  If your BP remains >160/110, go to [facility] or call us if during the day.

## 2017-03-10 NOTE — Progress Notes (Signed)
Fetal Surveillance Testing today:  NST 145  High Risk Pregnancy Diagnosis(es):   CHTN, A2/B Dm  G2P0101 3022w1d Estimated Date of Delivery: 03/23/17  Blood pressure (!) 150/98, pulse 90, weight 259 lb (117.5 kg).  Urinalysis: Negative   HPI: The patient is being seen today for ongoing management of the above. Today she reports She has started taking gyburide 2.5mg  in am too and her BS have been better.  Takes BP at home, had one 162 systolic   BP weight and urine results all reviewed and noted. Patient reports good fetal movement, denies any bleeding and no rupture of membranes symptoms or regular contractions.  Fundal Height:   Fetal Heart rate:  145 Edema:  1+  Patient is without complaints other than noted in her HPI. All questions were answered.  All lab and sonogram results have been reviewed. Comments:  NST reactive  Assessment:  1.  Pregnancy at 6722w1d,  Estimated Date of Delivery: 03/23/17 :                          2.  CHTN, increase BP                        3.  A2/B dm  Medication(s) Plans:  Increase labetalol to 300mg  BID and glyburide 2.5 mg BID  Treatment Plan:  US thursday  Return for As scheduled. for appointment for high risk OB care  No orders of the defined types were placed in this encounter.  Orders Placed This Encounter  Procedures  . POCT urinalysis dipstick

## 2017-03-12 ENCOUNTER — Inpatient Hospital Stay (HOSPITAL_COMMUNITY): Payer: PRIVATE HEALTH INSURANCE | Admitting: Certified Registered Nurse Anesthetist

## 2017-03-12 ENCOUNTER — Encounter (HOSPITAL_COMMUNITY)
Admission: AD | Disposition: A | Discharge status: Home / Self Care | Payer: Self-pay | Source: Ambulatory Visit | Attending: Obstetrics and Gynecology

## 2017-03-12 ENCOUNTER — Encounter: Payer: Self-pay | Admitting: Women's Health

## 2017-03-12 ENCOUNTER — Ambulatory Visit (INDEPENDENT_AMBULATORY_CARE_PROVIDER_SITE_OTHER): Payer: PRIVATE HEALTH INSURANCE | Admitting: Women's Health

## 2017-03-12 ENCOUNTER — Inpatient Hospital Stay (HOSPITAL_COMMUNITY)
Admission: AD | Admit: 2017-03-12 | Discharge: 2017-03-15 | DRG: 765 | Disposition: A | Discharge status: Home / Self Care | Payer: PRIVATE HEALTH INSURANCE | Source: Ambulatory Visit | Attending: Obstetrics and Gynecology | Admitting: Obstetrics and Gynecology

## 2017-03-12 ENCOUNTER — Other Ambulatory Visit: Payer: Self-pay | Admitting: Women's Health

## 2017-03-12 ENCOUNTER — Ambulatory Visit (INDEPENDENT_AMBULATORY_CARE_PROVIDER_SITE_OTHER): Payer: PRIVATE HEALTH INSURANCE

## 2017-03-12 ENCOUNTER — Encounter (HOSPITAL_COMMUNITY): Payer: Self-pay | Admitting: *Deleted

## 2017-03-12 VITALS — BP 160/80 | HR 70 | Wt 259.0 lb

## 2017-03-12 DIAGNOSIS — O10919 Unspecified pre-existing hypertension complicating pregnancy, unspecified trimester: Secondary | ICD-10-CM

## 2017-03-12 DIAGNOSIS — O34211 Maternal care for low transverse scar from previous cesarean delivery: Secondary | ICD-10-CM | POA: Diagnosis present

## 2017-03-12 DIAGNOSIS — O9962 Diseases of the digestive system complicating childbirth: Secondary | ICD-10-CM | POA: Diagnosis present

## 2017-03-12 DIAGNOSIS — Z3A38 38 weeks gestation of pregnancy: Secondary | ICD-10-CM

## 2017-03-12 DIAGNOSIS — Z8249 Family history of ischemic heart disease and other diseases of the circulatory system: Secondary | ICD-10-CM

## 2017-03-12 DIAGNOSIS — K219 Gastro-esophageal reflux disease without esophagitis: Secondary | ICD-10-CM | POA: Diagnosis present

## 2017-03-12 DIAGNOSIS — O99214 Obesity complicating childbirth: Secondary | ICD-10-CM | POA: Diagnosis present

## 2017-03-12 DIAGNOSIS — O0990 Supervision of high risk pregnancy, unspecified, unspecified trimester: Secondary | ICD-10-CM

## 2017-03-12 DIAGNOSIS — O99213 Obesity complicating pregnancy, third trimester: Secondary | ICD-10-CM

## 2017-03-12 DIAGNOSIS — O10913 Unspecified pre-existing hypertension complicating pregnancy, third trimester: Secondary | ICD-10-CM | POA: Diagnosis not present

## 2017-03-12 DIAGNOSIS — Z87891 Personal history of nicotine dependence: Secondary | ICD-10-CM

## 2017-03-12 DIAGNOSIS — O24419 Gestational diabetes mellitus in pregnancy, unspecified control: Secondary | ICD-10-CM

## 2017-03-12 DIAGNOSIS — O0993 Supervision of high risk pregnancy, unspecified, third trimester: Secondary | ICD-10-CM | POA: Diagnosis not present

## 2017-03-12 DIAGNOSIS — Z6841 Body Mass Index (BMI) 40.0 and over, adult: Secondary | ICD-10-CM

## 2017-03-12 DIAGNOSIS — Z833 Family history of diabetes mellitus: Secondary | ICD-10-CM

## 2017-03-12 DIAGNOSIS — O1002 Pre-existing essential hypertension complicating childbirth: Secondary | ICD-10-CM | POA: Diagnosis present

## 2017-03-12 DIAGNOSIS — O403XX0 Polyhydramnios, third trimester, not applicable or unspecified: Secondary | ICD-10-CM | POA: Diagnosis present

## 2017-03-12 DIAGNOSIS — Z1389 Encounter for screening for other disorder: Secondary | ICD-10-CM

## 2017-03-12 DIAGNOSIS — Z8632 Personal history of gestational diabetes: Secondary | ICD-10-CM

## 2017-03-12 DIAGNOSIS — Z331 Pregnant state, incidental: Secondary | ICD-10-CM

## 2017-03-12 DIAGNOSIS — O114 Pre-existing hypertension with pre-eclampsia, complicating childbirth: Secondary | ICD-10-CM | POA: Diagnosis present

## 2017-03-12 DIAGNOSIS — I10 Essential (primary) hypertension: Secondary | ICD-10-CM | POA: Diagnosis present

## 2017-03-12 DIAGNOSIS — Z98891 History of uterine scar from previous surgery: Secondary | ICD-10-CM

## 2017-03-12 DIAGNOSIS — E669 Obesity, unspecified: Secondary | ICD-10-CM | POA: Diagnosis present

## 2017-03-12 DIAGNOSIS — Z141 Cystic fibrosis carrier: Secondary | ICD-10-CM | POA: Diagnosis not present

## 2017-03-12 DIAGNOSIS — O1414 Severe pre-eclampsia complicating childbirth: Secondary | ICD-10-CM | POA: Diagnosis not present

## 2017-03-12 LAB — CBC
HCT: 31.5 % — ABNORMAL LOW (ref 36.0–46.0)
Hemoglobin: 10.6 g/dL — ABNORMAL LOW (ref 12.0–15.0)
MCH: 31.9 pg (ref 26.0–34.0)
MCHC: 33.7 g/dL (ref 30.0–36.0)
MCV: 94.9 fL (ref 78.0–100.0)
PLATELETS: 219 10*3/uL (ref 150–400)
RBC: 3.32 MIL/uL — ABNORMAL LOW (ref 3.87–5.11)
RDW: 14.9 % (ref 11.5–15.5)
WBC: 10.6 10*3/uL — AB (ref 4.0–10.5)

## 2017-03-12 LAB — POCT URINALYSIS DIPSTICK
Blood, UA: NEGATIVE
GLUCOSE UA: NEGATIVE
GLUCOSE UA: NEGATIVE
Ketones, UA: NEGATIVE
NITRITE UA: NEGATIVE
PROTEIN UA: NEGATIVE

## 2017-03-12 LAB — COMPREHENSIVE METABOLIC PANEL
ALT: 15 U/L (ref 14–54)
AST: 17 U/L (ref 15–41)
Albumin: 3.1 g/dL — ABNORMAL LOW (ref 3.5–5.0)
Alkaline Phosphatase: 103 U/L (ref 38–126)
Anion gap: 8 (ref 5–15)
BILIRUBIN TOTAL: 0.6 mg/dL (ref 0.3–1.2)
BUN: 12 mg/dL (ref 6–20)
CHLORIDE: 106 mmol/L (ref 101–111)
CO2: 21 mmol/L — AB (ref 22–32)
Calcium: 8.8 mg/dL — ABNORMAL LOW (ref 8.9–10.3)
Creatinine, Ser: 0.66 mg/dL (ref 0.44–1.00)
GFR calc Af Amer: >60 mL/min (ref >60)
GFR calc non Af Amer: >60 mL/min (ref >60)
Glucose, Bld: 68 mg/dL (ref 65–99)
Potassium: 3.9 mmol/L (ref 3.5–5.1)
Sodium: 135 mmol/L (ref 135–145)
Total Protein: 6.3 g/dL — ABNORMAL LOW (ref 6.5–8.1)

## 2017-03-12 LAB — PROTEIN / CREATININE RATIO, URINE
Creatinine, Urine: 173 mg/dL
PROTEIN CREATININE RATIO: 0.07 mg/mg{creat} (ref 0.00–0.15)
Total Protein, Urine: 12 mg/dL

## 2017-03-12 LAB — GLUCOSE, CAPILLARY: GLUCOSE-CAPILLARY: 81 mg/dL (ref 65–99)

## 2017-03-12 SURGERY — Surgical Case
Anesthesia: Spinal

## 2017-03-12 MED ORDER — BUPIVACAINE IN DEXTROSE 0.75-8.25 % IT SOLN
INTRATHECAL | Status: DC | PRN
  Administered 2017-03-12: 1.7 mL via INTRATHECAL

## 2017-03-12 MED ORDER — MAGNESIUM SULFATE BOLUS VIA INFUSION
6.0000 g | Freq: Once | INTRAVENOUS | Status: AC
  Administered 2017-03-12: 6 g via INTRAVENOUS
  Filled 2017-03-12: qty 500

## 2017-03-12 MED ORDER — MORPHINE SULFATE (PF) 0.5 MG/ML IJ SOLN
INTRAMUSCULAR | Status: DC | PRN
  Administered 2017-03-12: .2 mg via EPIDURAL

## 2017-03-12 MED ORDER — SCOPOLAMINE 1 MG/3DAYS TD PT72
MEDICATED_PATCH | TRANSDERMAL | Status: AC
  Filled 2017-03-12: qty 1

## 2017-03-12 MED ORDER — HYDRALAZINE HCL 20 MG/ML IJ SOLN
10.0000 mg | Freq: Once | INTRAMUSCULAR | Status: DC | PRN
  Filled 2017-03-12: qty 1

## 2017-03-12 MED ORDER — OXYTOCIN 10 UNIT/ML IJ SOLN
INTRAMUSCULAR | Status: AC
  Filled 2017-03-12: qty 4

## 2017-03-12 MED ORDER — ONDANSETRON HCL 4 MG/2ML IJ SOLN
INTRAMUSCULAR | Status: AC
  Filled 2017-03-12: qty 2

## 2017-03-12 MED ORDER — PHENYLEPHRINE 8 MG IN D5W 100 ML (0.08MG/ML) PREMIX OPTIME
INJECTION | INTRAVENOUS | Status: DC | PRN
  Administered 2017-03-12: 60 ug/min via INTRAVENOUS

## 2017-03-12 MED ORDER — EPHEDRINE 5 MG/ML INJ
INTRAVENOUS | Status: AC
  Filled 2017-03-12: qty 20

## 2017-03-12 MED ORDER — FAMOTIDINE IN NACL 20-0.9 MG/50ML-% IV SOLN
20.0000 mg | Freq: Once | INTRAVENOUS | Status: AC
  Administered 2017-03-12: 20 mg via INTRAVENOUS
  Filled 2017-03-12: qty 50

## 2017-03-12 MED ORDER — FENTANYL CITRATE (PF) 100 MCG/2ML IJ SOLN
INTRAMUSCULAR | Status: DC | PRN
  Administered 2017-03-12: 20 ug via INTRAVENOUS

## 2017-03-12 MED ORDER — ONDANSETRON HCL 4 MG/2ML IJ SOLN
INTRAMUSCULAR | Status: DC | PRN
  Administered 2017-03-12: 4 mg via INTRAVENOUS

## 2017-03-12 MED ORDER — PHENYLEPHRINE 8 MG IN D5W 100 ML (0.08MG/ML) PREMIX OPTIME
INJECTION | INTRAVENOUS | Status: AC
  Filled 2017-03-12: qty 100

## 2017-03-12 MED ORDER — SODIUM CHLORIDE 0.9 % IR SOLN
Status: DC | PRN
  Administered 2017-03-12: 1

## 2017-03-12 MED ORDER — LACTATED RINGERS IV SOLN
INTRAVENOUS | Status: DC | PRN
  Administered 2017-03-12 (×2): via INTRAVENOUS

## 2017-03-12 MED ORDER — MAGNESIUM SULFATE 50 % IJ SOLN
2.0000 g/h | INTRAVENOUS | Status: DC
  Administered 2017-03-12: 2 g/h via INTRAVENOUS
  Filled 2017-03-12: qty 80

## 2017-03-12 MED ORDER — OXYTOCIN 10 UNIT/ML IJ SOLN
INTRAMUSCULAR | Status: DC | PRN
  Administered 2017-03-12: 40 [IU] via INTRAVENOUS

## 2017-03-12 MED ORDER — LABETALOL HCL 5 MG/ML IV SOLN
20.0000 mg | INTRAVENOUS | Status: AC | PRN
  Administered 2017-03-12: 80 mg via INTRAVENOUS
  Administered 2017-03-12: 20 mg via INTRAVENOUS
  Administered 2017-03-12: 40 mg via INTRAVENOUS
  Filled 2017-03-12: qty 8
  Filled 2017-03-12: qty 16
  Filled 2017-03-12: qty 4

## 2017-03-12 MED ORDER — EPHEDRINE SULFATE 50 MG/ML IJ SOLN
INTRAMUSCULAR | Status: DC | PRN
  Administered 2017-03-12 (×3): 5 mg via INTRAVENOUS
  Administered 2017-03-12 (×2): 10 mg via INTRAVENOUS
  Administered 2017-03-12 (×4): 5 mg via INTRAVENOUS
  Administered 2017-03-12: 10 mg via INTRAVENOUS
  Administered 2017-03-12 (×3): 5 mg via INTRAVENOUS
  Administered 2017-03-12: 10 mg via INTRAVENOUS
  Administered 2017-03-12: 5 mg via INTRAVENOUS
  Administered 2017-03-12 (×2): 10 mg via INTRAVENOUS
  Administered 2017-03-12: 5 mg via INTRAVENOUS
  Administered 2017-03-12: 10 mg via INTRAVENOUS
  Administered 2017-03-12 (×2): 5 mg via INTRAVENOUS
  Administered 2017-03-12: 10 mg via INTRAVENOUS
  Administered 2017-03-12: 5 mg via INTRAVENOUS

## 2017-03-12 MED ORDER — SCOPOLAMINE 1 MG/3DAYS TD PT72
MEDICATED_PATCH | TRANSDERMAL | Status: DC | PRN
  Administered 2017-03-12: 1 via TRANSDERMAL

## 2017-03-12 MED ORDER — MORPHINE SULFATE (PF) 0.5 MG/ML IJ SOLN
INTRAMUSCULAR | Status: AC
  Filled 2017-03-12: qty 10

## 2017-03-12 MED ORDER — CEFAZOLIN SODIUM-DEXTROSE 2-4 GM/100ML-% IV SOLN
2.0000 g | INTRAVENOUS | Status: AC
  Administered 2017-03-12: 2 g via INTRAVENOUS
  Filled 2017-03-12: qty 100

## 2017-03-12 MED ORDER — DIPHENHYDRAMINE HCL 50 MG/ML IJ SOLN
INTRAMUSCULAR | Status: DC | PRN
  Administered 2017-03-12: 25 mg via INTRAVENOUS

## 2017-03-12 MED ORDER — DIPHENHYDRAMINE HCL 50 MG/ML IJ SOLN
INTRAMUSCULAR | Status: AC
  Filled 2017-03-12: qty 1

## 2017-03-12 MED ORDER — SOD CITRATE-CITRIC ACID 500-334 MG/5ML PO SOLN
30.0000 mL | Freq: Once | ORAL | Status: AC
  Administered 2017-03-12: 30 mL via ORAL
  Filled 2017-03-12: qty 15

## 2017-03-12 MED ORDER — LACTATED RINGERS IV SOLN
INTRAVENOUS | Status: DC
  Administered 2017-03-12: 19:00:00 via INTRAVENOUS

## 2017-03-12 MED ORDER — LACTATED RINGERS IV SOLN
INTRAVENOUS | Status: DC | PRN
  Administered 2017-03-12: 22:00:00 via INTRAVENOUS

## 2017-03-12 MED ORDER — FENTANYL CITRATE (PF) 100 MCG/2ML IJ SOLN
INTRAMUSCULAR | Status: AC
  Filled 2017-03-12: qty 2

## 2017-03-12 SURGICAL SUPPLY — 29 items
CHLORAPREP W/TINT 26ML (MISCELLANEOUS) ×3 IMPLANT
CLAMP CORD UMBIL (MISCELLANEOUS) IMPLANT
CONTAINER PREFILL 10% NBF 15ML (MISCELLANEOUS) IMPLANT
DRSG OPSITE POSTOP 4X10 (GAUZE/BANDAGES/DRESSINGS) ×3 IMPLANT
ELECT REM PT RETURN 9FT ADLT (ELECTROSURGICAL) ×3
ELECTRODE REM PT RTRN 9FT ADLT (ELECTROSURGICAL) ×1 IMPLANT
EXTRACTOR VACUUM M CUP 4 TUBE (SUCTIONS) IMPLANT
EXTRACTOR VACUUM M CUP 4' TUBE (SUCTIONS)
GLOVE BIOGEL PI IND STRL 6.5 (GLOVE) ×1 IMPLANT
GLOVE BIOGEL PI IND STRL 7.0 (GLOVE) ×1 IMPLANT
GLOVE BIOGEL PI INDICATOR 6.5 (GLOVE) ×2
GLOVE BIOGEL PI INDICATOR 7.0 (GLOVE) ×2
GLOVE SURG SS PI 6.0 STRL IVOR (GLOVE) ×3 IMPLANT
GOWN STRL REUS W/TWL LRG LVL3 (GOWN DISPOSABLE) ×6 IMPLANT
KIT ABG SYR 3ML LUER SLIP (SYRINGE) IMPLANT
NEEDLE HYPO 25X5/8 SAFETYGLIDE (NEEDLE) IMPLANT
NS IRRIG 1000ML POUR BTL (IV SOLUTION) ×3 IMPLANT
PACK C SECTION WH (CUSTOM PROCEDURE TRAY) ×3 IMPLANT
PAD OB MATERNITY 4.3X12.25 (PERSONAL CARE ITEMS) ×3 IMPLANT
PENCIL SMOKE EVAC W/HOLSTER (ELECTROSURGICAL) ×3 IMPLANT
RTRCTR C-SECT PINK 25CM LRG (MISCELLANEOUS) IMPLANT
SEPRAFILM MEMBRANE 5X6 (MISCELLANEOUS) IMPLANT
SUT PLAIN 0 NONE (SUTURE) IMPLANT
SUT PLAIN 2 0 (SUTURE) ×2
SUT PLAIN ABS 2-0 CT1 27XMFL (SUTURE) ×1 IMPLANT
SUT VIC AB 0 CT1 36 (SUTURE) ×12 IMPLANT
SUT VIC AB 4-0 KS 27 (SUTURE) ×3 IMPLANT
TOWEL OR 17X24 6PK STRL BLUE (TOWEL DISPOSABLE) ×3 IMPLANT
TRAY FOLEY CATH SILVER 14FR (SET/KITS/TRAYS/PACK) ×3 IMPLANT

## 2017-03-12 NOTE — Anesthesia Procedure Notes (Signed)
Spinal  Patient location during procedure: OR Start time: 03/12/2017 9:42 PM Staffing Anesthesiologist: Mal AmabileFOSTER, Kento Gossman Performed: anesthesiologist  Preanesthetic Checklist Completed: patient identified, site marked, surgical consent, pre-op evaluation, timeout performed, IV checked, risks and benefits discussed and monitors and equipment checked Spinal Block Patient position: sitting Prep: DuraPrep Patient monitoring: heart rate, cardiac monitor, continuous pulse ox and blood pressure Approach: midline Location: L4-5 Injection technique: single-shot Needle Needle type: Sprotte  Needle gauge: 24 G Needle length: 9 cm Needle insertion depth: 7 cm Assessment Sensory level: T4 Additional Notes Patient tolerated procedure well. Adequate sensory level.

## 2017-03-12 NOTE — Progress Notes (Signed)
High Risk Pregnancy Diagnosis(es): CHTN, A2/BDM G2P0101 6354w3d Estimated Date of Delivery: 03/23/17 BP (!) 160/80   Pulse 70   Wt 259 lb (117.5 kg)   LMP  (LMP Unknown)   BMI 43.10 kg/m   Urinalysis: Negative HPI:  Started taking glyburide 2.5mg  bid at last visit- reports bs all >90 highest 103 so she increased herself to 5mg  BID and for past 2 days fbs in 80s, 2hr pp mostly <120 w/ occ slightly over 120. Did not bring log again.  BP was elevated on Tues, labetalol was increased to 300mg  bid- BPs at home last night 169/117, headache today- relieved some by apap, denies visual changes, ruq/epigastric pain.  BP, weight, and urine reviewed.  Reports good fm. Denies regular uc's, lof, vb, uti s/s.   Fundal Height:  40 Fetal Heart rate:  162 u/s Edema:  1+, DTRs 2+, no clonus  Reviewed today's u/s: bpp 8/8, dopplers good w/ good EDF, afi 13, efw 89%/3888g  All questions were answered Assessment: 8154w3d worsening CHTN vs. developing super-imposed pre-e, A2/BDM Medication(s) Plans:  Currently on labetalol 300mg  bid, glyburide 5mg  bid (pt made this change herself from 2.5mg  bid), baby asa Treatment Plan:  Discussed all w/ Dr. Jolayne Pantheronstant as bp's are worsening, scheduled for repeat c/s on Monday 3/19- per Dr. Jolayne Pantheronstant, pt [facility] to [facility]/poss delivery today. Notified Melanie, [employer]M [facility] [employer] to expect pt Will need 1wk post-op visit for incision check, then 4-6wk visit for pp visit

## 2017-03-12 NOTE — Anesthesia Preprocedure Evaluation (Addendum)
Anesthesia Evaluation  Patient identified by MRN, date of birth, ID band Patient awake    Reviewed: Allergy & Precautions, NPO status , Patient's Chart, lab work & pertinent test results, reviewed documented beta blocker date and time   History of Anesthesia Complications (+) history of anesthetic complications  Airway Mallampati: III  TM Distance: >3 FB Neck ROM: Full    Dental no notable dental hx. (+) Teeth Intact   Pulmonary former smoker,    Pulmonary exam normal breath sounds clear to auscultation       Cardiovascular hypertension, Pt. on medications and Pt. on home beta blockers Normal cardiovascular exam Rhythm:Regular Rate:Normal     Neuro/Psych  Neuromuscular disease negative psych ROS   GI/Hepatic Neg liver ROS, GERD  ,  Endo/Other  diabetes, Well Controlled, Gestational, Oral Hypoglycemic AgentsMorbid obesity  Renal/GU negative Renal ROS  negative genitourinary   Musculoskeletal negative musculoskeletal ROS (+)   Abdominal (+) + obese,   Peds  Hematology  (+) anemia ,   Anesthesia Other Findings   Reproductive/Obstetrics (+) Pregnancy Previous C/section Pre eclampsia superimposed on cHTN                            Anesthesia Physical Anesthesia Plan  ASA: III and emergent  Anesthesia Plan: Spinal   Post-op Pain Management:    Induction:   Airway Management Planned: Natural Airway  Additional Equipment:   Intra-op Plan:   Post-operative Plan:   Informed Consent: I have reviewed the patients History and Physical, chart, labs and discussed the procedure including the risks, benefits and alternatives for the proposed anesthesia with the patient or authorized representative who has indicated his/her understanding and acceptance.     Plan Discussed with: Anesthesiologist, CRNA and Surgeon  Anesthesia Plan Comments:         Anesthesia Quick Evaluation

## 2017-03-12 NOTE — H&P (Signed)
LABOR AND DELIVERY ADMISSION HISTORY AND PHYSICAL NOTE  Katelyn Avila is a 31 y.o. female G2P0101 with IUP at [redacted]w[redacted]d by 6 wk [place] presenting for severe range blood pressures, sent from office.   Patient was seen in office, was sent in due to severe range blood pressures. She is a chronic hypertensive who was on Lisinopril prior to pregnancy, then on Labetalol during pregnancy. She states her BPs were well controlled during the pregnancy until this week. She has noticed HAs throughout the week, including this AM she had a bad headache all day (did not take anything for it), and she noticed scitomas too. On arrival to [facility]U, her headache and scitomas have resolved but had severe range BPs. She DID take her medication today. Denies any RUQ/epigastric pain.   She reports positive fetal movement. She denies leakage of fluid or vaginal bleeding.  Prenatal History/Complications:  GDMA2/B; CHTN with SIPE w/ severe features; previous cesarean  Past Medical History: Past Medical History:  Diagnosis Date  . 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 Carpal tunnel syndrome, bilateral   . Cervical high risk HPV (human papillomavirus) test positive 07/25/2015  . Complication of anesthesia    states woke up during T & A  . Gestational diabetes   . Hidradenitis 07/23/2015  . Hypertension    under control "most of the time"; has been on med. since 09/2014  . Neuromuscular disorder (HCC)   . Pain with urination 07/23/2015  . PCOS (polycystic ovarian syndrome)   . Seasonal allergies     Past Surgical History: Past Surgical History:  Procedure Laterality Date  . CARPAL TUNNEL RELEASE Right 02/22/2015   Procedure: RIGHT CARPAL TUNNEL RELEASE;  Surgeon: Betha Loa, MD;  Location:  SURGERY CENTER;  Service: Orthopedics;  Laterality: Right;  . CESAREAN SECTION    . TONSILLECTOMY AND ADENOIDECTOMY    . WISDOM TOOTH EXTRACTION      Obstetrical History: OB History    Gravida Para Term Preterm AB Living   2 1   1   1     SAB TAB Ectopic Multiple Live Births           1      Social History: Social History   Social History  . Marital status: Married    Spouse name: N/A  . Number of children: N/A  . Years of education: N/A   Social History Main Topics  . Smoking status: Former Smoker    Packs/day: 0.50    Years: 13.00    Types: Cigarettes    Quit date: 07/11/2016  . Smokeless tobacco: Never Used  . Alcohol use No     Comment: 1-2 x/week and weekends-before pregnancy  . Drug use: No  . Sexual activity: Yes    Birth control/ protection: None   Other Topics Concern  . None   Social History Narrative  . None    Family History: Family History  Problem Relation Age of Onset  . Fibroids Mother   . Hypertension Mother   . Endometriosis Mother   . Hypertension Father   . Hypertension Brother   . Hypertension Maternal Grandmother   . Diabetes Maternal Grandmother   . COPD Maternal Grandmother   . Hypertension Maternal Grandfather   . Hypertension Paternal Grandmother   . Cancer Paternal Grandmother     breast  . Hypertension Paternal Grandfather     Allergies: No Known Allergies  Prescriptions Prior to Admission  Medication Sig Dispense Refill Last Dose  . acetaminophen (  TYLENOL) 500 MG tablet Take 1,000 mg by mouth every 6 (six) hours as needed for headache.   Taking  . aspirin EC 81 MG tablet Take 81 mg by mouth daily.   Taking  . diphenhydrAMINE (BENADRYL) 25 MG tablet Take 25 mg by mouth at bedtime as needed for allergies.   Taking  . ferrous sulfate 325 (65 FE) MG tablet Take 1 tablet (325 mg total) by mouth 2 (two) times daily with a meal. 60 tablet 3 Taking  . glyBURIDE (DIABETA) 5 MG tablet Take 1 tablet (5 mg total) by mouth 2 (two) times daily with a meal. (Patient taking differently: Take 5 mg by mouth daily. ) 60 tablet 2 Taking  . labetalol (NORMODYNE) 200 MG tablet Take 1 tablet (200 mg total) by mouth 2 (two) times daily. 60 tablet 3 Taking  . omeprazole (PRILOSEC) 20  MG capsule Take 20 mg by mouth at bedtime.   Taking  . Prenatal MV & Min w/FA-DHA (PRENATAL ADULT GUMMY/DHA/FA PO) Take 2 tablets by mouth daily.   Taking     Review of Systems   All systems reviewed and negative except as stated in HPI  Blood pressure (!) 160/82, pulse 79, temperature 99.4 F (37.4 C), temperature source Oral, resp. rate 20, height 5\' 5"  (1.651 m), weight 257 lb 1.3 oz (116.6 kg). General appearance: alert, cooperative, appears stated age, no distress and morbidly obese Lungs: clear to auscultation bilaterally Heart: regular rate and rhythm Abdomen: soft, non-tender; bowel sounds normal Extremities: No calf swelling or tenderness; +2/4 patellar reflexes bilaterally; 1 beat clonus bilaterally Presentation: cephalic Fetal monitoring: 125 bpm, mod var, +accels, no decels Uterine activity: rare     Prenatal labs: ABO, Rh: A/Positive/-- (08/15 1535) Antibody: Negative (01/02 0912) Rubella: !Error! IMMUNE RPR: Non Reactive (01/02 0912)  HBsAg: Negative (08/15 1535)  HIV: Non Reactive (01/02 0912)  GBS: Negative (03/07 1600)  2 hr Glucola: ABNL 101/206/173 (early) Genetic screening:  NEG Anatomy US: Female, normal, BPD >94%  Prenatal Transfer Tool  Maternal Diabetes: Yes:  Diabetes Type:  Insulin/Medication controlled Genetic Screening: Normal Maternal Ultrasounds/Referrals: Normal Fetal Ultrasounds or other Referrals:  None Maternal Substance Abuse:  No Significant Maternal Medications:  Meds include: Other:  ASA 81mg ; Labetalol 200mg  BID; glyburide 5mg  BID Significant Maternal Lab Results: Lab values include: Group B Strep negative  Results for orders placed or performed during the hospital encounter of 03/12/17 (from the past 24 hour(s))  Protein / creatinine ratio, urine   Collection Time: 03/12/17  5:31 PM  Result Value Ref Range   Creatinine, Urine 173.00 mg/dL   Total Protein, Urine 12 mg/dL   Protein Creatinine Ratio 0.07 0.00 - 0.15 mg/mg[Cre]  CBC    Collection Time: 03/12/17  5:54 PM  Result Value Ref Range   WBC 10.6 (H) 4.0 - 10.5 K/uL   RBC 3.32 (L) 3.87 - 5.11 MIL/uL   Hemoglobin 10.6 (L) 12.0 - 15.0 g/dL   HCT 86.5 (L) 78.4 - 69.6 %   MCV 94.9 78.0 - 100.0 fL   MCH 31.9 26.0 - 34.0 pg   MCHC 33.7 30.0 - 36.0 g/dL   RDW 29.5 28.4 - 13.2 %   Platelets 219 150 - 400 K/uL  Comprehensive metabolic panel   Collection Time: 03/12/17  5:54 PM  Result Value Ref Range   Sodium 135 135 - 145 mmol/L   Potassium 3.9 3.5 - 5.1 mmol/L   Chloride 106 101 - 111 mmol/L   CO2 21 (L)  22 - 32 mmol/L   Glucose, Bld 68 65 - 99 mg/dL   BUN 12 6 - 20 mg/dL   Creatinine, Ser 0.980.66 0.44 - 1.00 mg/dL   Calcium 8.8 (L) 8.9 - 10.3 mg/dL   Total Protein 6.3 (L) 6.5 - 8.1 g/dL   Albumin 3.1 (L) 3.5 - 5.0 g/dL   AST 17 15 - 41 U/L   ALT 15 14 - 54 U/L   Alkaline Phosphatase 103 38 - 126 U/L   Total Bilirubin 0.6 0.3 - 1.2 mg/dL   GFR calc non Af Amer >60 >60 mL/min   GFR calc Af Amer >60 >60 mL/min   Anion gap 8 5 - 15  Results for orders placed or performed in visit on 03/12/17 (from the past 24 hour(s))  POCT Urinalysis Dipstick   Collection Time: 03/12/17  2:30 PM  Result Value Ref Range   Color, UA     Clarity, UA     Glucose, UA neg    Bilirubin, UA     Ketones, UA neg    Spec Grav, UA  1.030 - 1.035   Blood, UA neg    pH, UA  5.0 - 8.0   Protein, UA neg    Urobilinogen, UA  Negative - 2.0   Nitrite, UA neg    Leukocytes, UA Trace (A) Negative  Results for orders placed or performed in visit on 02/16/17 (from the past 24 hour(s))  POCT urinalysis dipstick   Collection Time: 03/12/17  2:31 PM  Result Value Ref Range   Color, UA     Clarity, UA     Glucose, UA neg    Bilirubin, UA     Ketones, UA     Spec Grav, UA  1.030 - 1.035   Blood, UA     pH, UA  5.0 - 8.0   Protein, UA trace    Urobilinogen, UA  Negative - 2.0   Nitrite, UA     Leukocytes, UA  Negative    Patient Active Problem List   Diagnosis Date Noted  .  Polyhydramnios 02/26/2017  . 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 Susceptible to varicella (non-immune), currently pregnant 08/18/2016  . Gestational diabetes mellitus, class A2 08/18/2016  . Chronic hypertension during pregnancy, antepartum 08/12/2016  . Previous cesarean section complicating pregnancy 08/12/2016  . Polycystic ovaries 08/12/2016  . Supervision of high risk pregnancy, antepartum 08/12/2016  . History of gestational diabetes in prior pregnancy, currently pregnant 08/12/2016  . Body mass index 39.0-39.9, adult 07/07/2016  . Dizziness and giddiness 07/07/2016  . Palpitations 07/07/2016  . Impulsiveness 06/04/2016  . Obesity 10/02/2015  . Cervical high risk HPV (human papillomavirus) test positive 07/25/2015  . Hidradenitis 07/23/2015  . Carpal tunnel syndrome 01/28/2015    Assessment: Katelyn Avila is a 31 y.o. G2P0101 at 8118w3d here for repeat cesarean section 2/2 CHTN with SIPE w/ severe features (severe range BPs)  #Labor: Cesarean, repeat #Pain:  Spinal #FWB:  Cat I #ID:   GBS Neg #MOF:  Breast #MOC: Depo #Circ:   Outpatient #CHTN w/ SIPE w/ severe features: magnesium gtt, labetalol protocol #GDMA2/B: CBG 68 on admission  Jen MowElizabeth Levada Bowersox, DO OB Fellow [facility] 03/12/2017, 8:24 PM

## 2017-03-12 NOTE — Progress Notes (Signed)
US 38+3 wks,cephalic,post pl gr 3,fhr 162 bpm,BPP 8/8,RI .50,.55,AFI 13.3 cm,EFW 3888 g 89%

## 2017-03-12 NOTE — Op Note (Signed)
Katelyn PattersonAshley W Avila PROCEDURE DATE: 03/12/2017  PREOPERATIVE DIAGNOSIS: Intrauterine pregnancy at  7312w3d weeks gestation with CHTN with superimposed preeclampsia  POSTOPERATIVE DIAGNOSIS: The same  PROCEDURE:     Cesarean Section  SURGEON:  Dr. Gigi GinPeggy Delainee Tramel  ASSISTANT: none  INDICATIONS: Katelyn Avila is a 31 y.o. G2P0101 at 5312w3d scheduled for [facility] section [facility] with superimposed pre[facility].  The risks of cesarean section discussed with the patient included but were not limited to: bleeding which may require transfusion or reoperation; infection which may require antibiotics; injury to bowel, bladder, ureters or other surrounding organs; injury to the fetus; need for additional procedures including hysterectomy in the event of a life-threatening hemorrhage; placental abnormalities wth subsequent pregnancies, incisional problems, thromboembolic phenomenon and other postoperative/anesthesia complications. The patient concurred with the proposed plan, giving informed written consent for the procedure.    FINDINGS:  Viable female infant in cephalic presentation with nuchal cord x 1. Vacuum used with 1 pull without pop off for delivery secondary to high BMI.  Apgars 8 and 9.  Clear amniotic fluid.  Intact placenta, three vessel cord.  Normal uterus, fallopian tubes and ovaries bilaterally.  ANESTHESIA:    Spinal INTRAVENOUS FLUIDS:1600 ml ESTIMATED BLOOD LOSS: 800 ml URINE OUTPUT:  500 ml SPECIMENS: Placenta sent to pathology COMPLICATIONS: None immediate  PROCEDURE IN DETAIL:  The patient received intravenous antibiotics and had sequential compression devices applied to her lower extremities while in the preoperative area.  She was then taken to the operating room where anesthesia was induced and was found to be adequate. A foley catheter was placed into her bladder and attached to Mickel Schreur gravity. She was then placed in a dorsal supine position with a leftward tilt, and prepped  and draped in a sterile manner. After an adequate timeout was performed, a Pfannenstiel skin incision was made with scalpel and carried through to the underlying layer of fascia. The fascia was incised in the midline and this incision was extended bilaterally using the Mayo scissors. Kocher clamps were applied to the superior aspect of the fascial incision and the underlying rectus muscles were dissected off bluntly. A similar process was carried out on the inferior aspect of the facial incision. The rectus muscles were separated in the midline bluntly and the peritoneum was entered bluntly. The Alexis self-retaining retractor was introduced into the abdominal cavity. Attention was turned to the lower uterine segment where a transverse hysterotomy was made with a scalpel and extended bilaterally bluntly. The infant was successfully delivered, and cord was clamped and cut and infant was handed over to awaiting neonatology team. Uterine massage was then administered and the placenta delivered intact with three-vessel cord. The uterus was cleared of clot and debris.  The hysterotomy was closed with 0 Vicryl in a running locked fashion, and an imbricating layer was also placed with a 0 Vicryl. Overall, excellent hemostasis was noted. The pelvis copiously irrigated and cleared of all clot and debris. Hemostasis was confirmed on all surfaces.  The peritoneum and the muscles were reapproximated using 0 vicryl interrupted stitches. The fascia was then closed using 0 Vicryl in a running fashion.  The subcutaneous layer was reapproximated with plain gut and the skin was closed in a subcuticular fashion using 3.0 Vicryl. The patient tolerated the procedure well. Sponge, lap, instrument and needle counts were correct x 2. She was taken to the recovery room in stable condition.    Katelyn Avila  03/12/2017 10:58 PM

## 2017-03-12 NOTE — MAU Note (Signed)
Pt sent from office for elevated b/p. Pt is a repeat c-section on Monday. C/o headache.

## 2017-03-12 NOTE — Transfer of Care (Signed)
Immediate Anesthesia Transfer of Care Note  Patient: Katelyn Avila  Procedure(s) Performed: Procedure(s): CESAREAN SECTION (N/A)  Patient Location: PACU  Anesthesia Type:Spinal  Level of Consciousness: awake, alert  and oriented  Airway & Oxygen Therapy: Patient Spontanous Breathing  Post-op Assessment: Report given to RN and Post -op Vital signs reviewed and stable  Post vital signs: Reviewed and stable  Last Vitals:  Vitals:   03/12/17 2101 03/12/17 2116  BP: 139/86 (!) 151/95  Pulse: 80 80  Resp:    Temp:      Last Pain:  Vitals:   03/12/17 1734  TempSrc: Oral  PainSc:          Complications: No apparent anesthesia complications

## 2017-03-13 ENCOUNTER — Encounter (HOSPITAL_COMMUNITY): Payer: Self-pay | Admitting: Anesthesiology

## 2017-03-13 ENCOUNTER — Encounter (HOSPITAL_COMMUNITY): Payer: Self-pay | Admitting: Obstetrics and Gynecology

## 2017-03-13 ENCOUNTER — Encounter (HOSPITAL_COMMUNITY)
Admission: RE | Admit: 2017-03-13 | Discharge: 2017-03-13 | Disposition: A | Discharge status: Home / Self Care | Payer: PRIVATE HEALTH INSURANCE | Source: Ambulatory Visit | Attending: Family Medicine | Admitting: Family Medicine

## 2017-03-13 HISTORY — DX: Gestational diabetes mellitus in pregnancy, unspecified control: O24.419

## 2017-03-13 LAB — CBC
HEMATOCRIT: 30.7 % — AB (ref 36.0–46.0)
Hemoglobin: 10.4 g/dL — ABNORMAL LOW (ref 12.0–15.0)
MCH: 31.6 pg (ref 26.0–34.0)
MCHC: 33.9 g/dL (ref 30.0–36.0)
MCV: 93.3 fL (ref 78.0–100.0)
Platelets: 206 10*3/uL (ref 150–400)
RBC: 3.29 MIL/uL — AB (ref 3.87–5.11)
RDW: 14.9 % (ref 11.5–15.5)
WBC: 14.3 10*3/uL — AB (ref 4.0–10.5)

## 2017-03-13 LAB — GLUCOSE, CAPILLARY: Glucose-Capillary: 105 mg/dL — ABNORMAL HIGH (ref 65–99)

## 2017-03-13 MED ORDER — NALOXONE HCL 2 MG/2ML IJ SOSY
1.0000 ug/kg/h | PREFILLED_SYRINGE | INTRAVENOUS | Status: DC | PRN
  Filled 2017-03-13: qty 2

## 2017-03-13 MED ORDER — TETANUS-DIPHTH-ACELL PERTUSSIS 5-2.5-18.5 LF-MCG/0.5 IM SUSP: 0.5000 mL | Freq: Once | INTRAMUSCULAR | Status: DC

## 2017-03-13 MED ORDER — COCONUT OIL OIL
1.0000 "application " | TOPICAL_OIL | Status: DC | PRN
  Administered 2017-03-14: 1 via TOPICAL
  Filled 2017-03-13: qty 120

## 2017-03-13 MED ORDER — TETANUS-DIPHTH-ACELL PERTUSSIS 5-2.5-18.5 LF-MCG/0.5 IM SUSP
0.5000 mL | Freq: Once | INTRAMUSCULAR | Status: AC
  Administered 2017-03-14: 0.5 mL via INTRAMUSCULAR
  Filled 2017-03-13: qty 0.5

## 2017-03-13 MED ORDER — LACTATED RINGERS IV SOLN
INTRAVENOUS | Status: DC
  Administered 2017-03-13: 13:00:00 via INTRAVENOUS

## 2017-03-13 MED ORDER — IBUPROFEN 600 MG PO TABS
600.0000 mg | ORAL_TABLET | Freq: Four times a day (QID) | ORAL | Status: DC
  Administered 2017-03-13 – 2017-03-15 (×9): 600 mg via ORAL
  Filled 2017-03-13 (×9): qty 1

## 2017-03-13 MED ORDER — SIMETHICONE 80 MG PO CHEW
80.0000 mg | CHEWABLE_TABLET | ORAL | Status: DC
  Administered 2017-03-14: 80 mg via ORAL
  Filled 2017-03-13 (×2): qty 1

## 2017-03-13 MED ORDER — ONDANSETRON HCL 4 MG/2ML IJ SOLN: 4.0000 mg | Freq: Three times a day (TID) | INTRAMUSCULAR | Status: DC | PRN

## 2017-03-13 MED ORDER — NALBUPHINE HCL 10 MG/ML IJ SOLN: 5.0000 mg | INTRAMUSCULAR | Status: DC | PRN

## 2017-03-13 MED ORDER — KETOROLAC TROMETHAMINE 30 MG/ML IJ SOLN
30.0000 mg | Freq: Four times a day (QID) | INTRAMUSCULAR | Status: AC | PRN
  Administered 2017-03-13: 30 mg via INTRAVENOUS
  Filled 2017-03-13: qty 1

## 2017-03-13 MED ORDER — WITCH HAZEL-GLYCERIN EX PADS: 1.0000 "application " | MEDICATED_PAD | CUTANEOUS | Status: DC | PRN

## 2017-03-13 MED ORDER — MENTHOL 3 MG MT LOZG: 1.0000 | LOZENGE | OROMUCOSAL | Status: DC | PRN

## 2017-03-13 MED ORDER — OXYTOCIN 40 UNITS IN LACTATED RINGERS INFUSION - SIMPLE MED: 2.5000 [IU]/h | INTRAVENOUS | Status: AC

## 2017-03-13 MED ORDER — NALBUPHINE HCL 10 MG/ML IJ SOLN: 5.0000 mg | Freq: Once | INTRAMUSCULAR | Status: DC | PRN

## 2017-03-13 MED ORDER — AMLODIPINE BESYLATE 5 MG PO TABS
5.0000 mg | ORAL_TABLET | Freq: Every day | ORAL | Status: DC
  Administered 2017-03-13 – 2017-03-15 (×3): 5 mg via ORAL
  Filled 2017-03-13 (×3): qty 1

## 2017-03-13 MED ORDER — IBUPROFEN 600 MG PO TABS: 600.0000 mg | ORAL_TABLET | Freq: Four times a day (QID) | ORAL | Status: DC | PRN

## 2017-03-13 MED ORDER — MEPERIDINE HCL 25 MG/ML IJ SOLN: 6.2500 mg | INTRAMUSCULAR | Status: DC | PRN

## 2017-03-13 MED ORDER — DIBUCAINE 1 % RE OINT: 1.0000 "application " | TOPICAL_OINTMENT | RECTAL | Status: DC | PRN

## 2017-03-13 MED ORDER — DIPHENHYDRAMINE HCL 50 MG/ML IJ SOLN: 12.5000 mg | INTRAMUSCULAR | Status: DC | PRN

## 2017-03-13 MED ORDER — KETOROLAC TROMETHAMINE 30 MG/ML IJ SOLN: 30.0000 mg | Freq: Four times a day (QID) | INTRAMUSCULAR | Status: AC | PRN

## 2017-03-13 MED ORDER — OXYCODONE HCL 5 MG PO TABS
5.0000 mg | ORAL_TABLET | ORAL | Status: DC | PRN
  Administered 2017-03-13 – 2017-03-14 (×2): 5 mg via ORAL
  Filled 2017-03-13 (×2): qty 1

## 2017-03-13 MED ORDER — SODIUM CHLORIDE 0.9% FLUSH: 3.0000 mL | INTRAVENOUS | Status: DC | PRN

## 2017-03-13 MED ORDER — DIPHENHYDRAMINE HCL 25 MG PO CAPS
25.0000 mg | ORAL_CAPSULE | ORAL | Status: DC | PRN
  Filled 2017-03-13: qty 1

## 2017-03-13 MED ORDER — SENNOSIDES-DOCUSATE SODIUM 8.6-50 MG PO TABS
2.0000 | ORAL_TABLET | ORAL | Status: DC
  Administered 2017-03-14 (×2): 2 via ORAL
  Filled 2017-03-13: qty 2

## 2017-03-13 MED ORDER — LACTATED RINGERS IV SOLN
2.0000 g/h | INTRAVENOUS | Status: AC
  Administered 2017-03-13: 2 g/h via INTRAVENOUS
  Filled 2017-03-13 (×2): qty 80

## 2017-03-13 MED ORDER — FENTANYL CITRATE (PF) 100 MCG/2ML IJ SOLN
INTRAMUSCULAR | Status: AC
  Filled 2017-03-13: qty 2

## 2017-03-13 MED ORDER — NALOXONE HCL 0.4 MG/ML IJ SOLN: 0.4000 mg | INTRAMUSCULAR | Status: DC | PRN

## 2017-03-13 MED ORDER — SIMETHICONE 80 MG PO CHEW: 80.0000 mg | CHEWABLE_TABLET | ORAL | Status: DC | PRN

## 2017-03-13 MED ORDER — ACETAMINOPHEN 500 MG PO TABS
1000.0000 mg | ORAL_TABLET | Freq: Four times a day (QID) | ORAL | Status: AC
  Administered 2017-03-13 (×3): 1000 mg via ORAL
  Filled 2017-03-13 (×3): qty 2

## 2017-03-13 MED ORDER — SIMETHICONE 80 MG PO CHEW
80.0000 mg | CHEWABLE_TABLET | Freq: Three times a day (TID) | ORAL | Status: DC
  Administered 2017-03-13 – 2017-03-15 (×6): 80 mg via ORAL
  Filled 2017-03-13 (×6): qty 1

## 2017-03-13 MED ORDER — ACETAMINOPHEN 325 MG PO TABS: 650.0000 mg | ORAL_TABLET | ORAL | Status: DC | PRN

## 2017-03-13 MED ORDER — OXYCODONE HCL 5 MG PO TABS
10.0000 mg | ORAL_TABLET | ORAL | Status: DC | PRN
  Administered 2017-03-13 – 2017-03-15 (×6): 10 mg via ORAL
  Filled 2017-03-13 (×6): qty 2

## 2017-03-13 MED ORDER — PRENATAL MULTIVITAMIN CH
1.0000 | ORAL_TABLET | Freq: Every day | ORAL | Status: DC
  Administered 2017-03-13 – 2017-03-14 (×2): 1 via ORAL
  Filled 2017-03-13 (×2): qty 1

## 2017-03-13 MED ORDER — FENTANYL CITRATE (PF) 100 MCG/2ML IJ SOLN
25.0000 ug | INTRAMUSCULAR | Status: DC | PRN
  Administered 2017-03-13: 25 ug via INTRAVENOUS

## 2017-03-13 MED ORDER — DIPHENHYDRAMINE HCL 25 MG PO CAPS
25.0000 mg | ORAL_CAPSULE | Freq: Four times a day (QID) | ORAL | Status: DC | PRN
Start: 2017-03-13 — End: 2017-03-15
  Administered 2017-03-13: 25 mg via ORAL

## 2017-03-13 NOTE — Anesthesia Postprocedure Evaluation (Signed)
Anesthesia Post Note  Patient: Everlean Pattersonshley W Brostrom  Procedure(s) Performed: Procedure(s) (LRB): CESAREAN SECTION (N/A)  Patient location during evaluation: PACU Anesthesia Type: Spinal Level of consciousness: awake and alert and oriented Pain management: pain level controlled Vital Signs Assessment: post-procedure vital signs reviewed and stable Respiratory status: spontaneous breathing, nonlabored ventilation and respiratory function stable Cardiovascular status: blood pressure returned to baseline and stable Postop Assessment: no headache, no backache, spinal receding, patient able to bend at knees and no signs of nausea or vomiting Anesthetic complications: no        Last Vitals:  Vitals:   03/13/17 0030 03/13/17 0100  BP:    Pulse:    Resp:    Temp: 37.1 C 36.8 C    Last Pain:  Vitals:   03/13/17 0045  TempSrc:   PainSc: 7    Pain Goal:                 Leilah Polimeni A.

## 2017-03-13 NOTE — Anesthesia Postprocedure Evaluation (Signed)
Anesthesia Post Note  Patient: Katelyn Avila  Procedure(s) Performed: Procedure(s) (LRB): CESAREAN SECTION (N/A)  Patient location during evaluation: [facility] Anesthesia Type: Spinal Level of consciousness: awake Pain management: pain level controlled Vital Signs Assessment: post-procedure vital signs reviewed and stable Respiratory status: spontaneous breathing Cardiovascular status: stable Postop Assessment: no headache, no backache, spinal receding and patient able to bend at knees Anesthetic complications: no        Last Vitals:  Vitals:   03/13/17 0630 03/13/17 0738  BP: 131/82 130/80  Pulse: 79 72  Resp: 18 18  Temp:  37.1 C    Last Pain:  Vitals:   03/13/17 0738  TempSrc: Oral  PainSc:    Pain Goal: Patients Stated Pain Goal: 3 (03/13/17 0145)               Edison PaceWILKERSON,Nasiah Polinsky

## 2017-03-13 NOTE — Plan of Care (Signed)
Problem: Education: Goal: Knowledge of condition will improve Outcome: Progressing Pt is doing skin to skin frequently. Pt is progressing with breastfeeding. LC on unit to visit at this time. Katelyn Avila   Problem: Life Cycle: Goal: Risk for postpartum hemorrhage will decrease Outcome: Progressing Bleeding is scant, appropriate.   Problem: Nutritional: Goal: Dietary intake will improve Outcome: Completed/Met Date Met: 03/13/17 Pt is tolerating Carb Modified Diet without complications.  Problem: Role Relationship: Goal: Ability to demonstrate positive interaction with newborn will improve Outcome: Completed/Met Date Met: 03/13/17 Pt and FOB interacting appropriately with infant.   Problem: Urinary Elimination: Goal: Ability to reestablish a normal urinary elimination pattern will improve Outcome: Not Progressing Urinary catheter in place. Katelyn Avila

## 2017-03-13 NOTE — Lactation Note (Signed)
This note was copied from a baby'Avila chart. Lactation Consultation Note  Patient Name: Katelyn Avila: 03/13/2017 Reason for consult: Initial assessment Breastfeeding consultation services and support information given and reviewed.  This is mom'Avila second baby and newborn is 3413 hours old.  Mom states she breastfed her first baby for 4 months and then she lost her milk supply.  Baby was just bathed and placed skin to skin in football hold on left side.  Baby showing early feeding cues.  Hand expression done and demonstrated to mom but no colostrum seen.  Left nipple inverts with compression.  Manual pump given with instructions to use prior to latching baby.  Attempted to latch baby to breast for several minutes and baby unable to grasp breast.  20 mm nipple shield applied with instructions.  Baby latched easily and nursed actively with few swallows.  Mom has had success on right side and that nipple everts more.  Instructed to feed with any cue and call for assist/concerns.  Maternal Data Has patient been taught Hand Expression?: Yes Does the patient have breastfeeding experience prior to this delivery?: Yes  Feeding Feeding Type: Breast Fed Length of feed: 20 min  LATCH Score/Interventions Latch: Repeated attempts needed to sustain latch, nipple held in mouth throughout feeding, stimulation needed to elicit sucking reflex. Intervention(Avila): Adjust position;Assist with latch;Breast massage;Breast compression  Audible Swallowing: A few with stimulation Intervention(Avila): Skin to skin;Hand expression;Alternate breast massage  Type of Nipple: Flat (right everts with dimple center, left inverts with compression) Intervention(Avila): Hand pump  Comfort (Breast/Nipple): Soft / non-tender     Hold (Positioning): Assistance needed to correctly position infant at breast and maintain latch. Intervention(Avila): Breastfeeding basics reviewed;Support Pillows;Position options;Skin to skin  LATCH  Score: 6  Lactation Tools Discussed/Used Tools: Nipple Dorris CarnesShields;Pump Nipple shield size: 20 Breast pump type: Manual   Consult Status Consult Status: Follow-up Avila: 03/14/17 Follow-up type: In-patient    Huston FoleyMOULDEN, Katelyn Avila 03/13/2017, 11:33 AM

## 2017-03-13 NOTE — Progress Notes (Signed)
Subjective: Postpartum Day 1: Cesarean Delivery Patient reports feeling well. She denies HA, visual changes, RUQ/epigastric pain.     Objective: Vital signs in last 24 hours: Temp:  [98 F (36.7 C)-99.4 F (37.4 C)] 98.5 F (36.9 C) (03/16 0435) Pulse Rate:  [60-90] 79 (03/16 0630) Resp:  [11-22] 18 (03/16 0630) BP: (124-181)/(73-104) 131/82 (03/16 0630) SpO2:  [93 %-100 %] 97 % (03/16 0630) Weight:  [257 lb 1.3 oz (116.6 kg)-259 lb (117.5 kg)] 257 lb 1.3 oz (116.6 kg) (03/15 1734)  Physical Exam:  General: alert, cooperative and no distress Lochia: appropriate Uterine Fundus: firm Incision: covered with PICO dressing DVT Evaluation: No evidence of DVT seen on physical exam.   Recent Labs  03/12/17 1754 03/13/17 0547  HGB 10.6* 10.4*  HCT 31.5* 30.7*    Assessment/Plan: Status post Cesarean section. Doing well postoperatively.  Continue magnesium sulfate for seizure prophylaxis until 11pm Encourage ambulation Continue Norvasc for CHTN Continue current postpartum care .  Mckay Brandt 03/13/2017, 7:05 AM

## 2017-03-13 NOTE — Addendum Note (Signed)
Addendum  created 03/13/17 0803 by Earmon PhoenixValerie P Jessikah Dicker, CRNA   Sign clinical note

## 2017-03-14 MED ORDER — HYDROCHLOROTHIAZIDE 25 MG PO TABS
25.0000 mg | ORAL_TABLET | Freq: Once | ORAL | Status: AC
  Administered 2017-03-14: 25 mg via ORAL
  Filled 2017-03-14: qty 1

## 2017-03-14 NOTE — Progress Notes (Signed)
Post Partum Day 1 Subjective: no complaints, up ad lib, voiding, tolerating PO and + flatus; No signs of worsening Pre E  Objective: Blood pressure (!) 153/80, pulse 66, temperature 98.1 F (36.7 C), temperature source Oral, resp. rate 16, height 5\' 5"  (1.651 m), weight 257 lb 1.3 oz (116.6 kg), SpO2 99 %.  Physical Exam:  General: alert, cooperative and no distress Lochia: appropriate Uterine Fundus: firm Incision: bandage c/d/i DVT Evaluation: No evidence of DVT seen on physical exam. Negative Homan's sign. No cords or calf tenderness.   Recent Labs  03/12/17 1754 03/13/17 0547  HGB 10.6* 10.4*  HCT 31.5* 30.7*    Assessment/Plan: HCTZ x1 dose; continue Norvasc Plan for D/C tomorrow Pp 2 hour GTT   LOS: 2 days   Katelyn Avila 03/14/2017, 10:08 AM

## 2017-03-14 NOTE — Lactation Note (Signed)
This note was copied from a baby's chart. Lactation Consultation Note  Patient Name: Katelyn Avila ZOXWR'UToday's Date: 03/14/2017 Reason for consult: Follow-up assessment  Baby at 6341 hrs old.  Mom trying to latch baby using a 20 mm nipple shield.  Baby in cradle hold, and Mom holding nipple shield in place with her fingers.  Baby lying in Mom's lap.  Offered to assist with more comfortable positioning to facilitate a deeper latch to breast.  Pillows added and assisted Mom to support baby's head and her breast.  Baby unable to latch without nipple shield.  Pre-pumped using manual pump.  Hand expression demonstrated as Mom was pinching nipple. Baby unable to latch deeply onto breast using a nipple shield.  Baby sucked couple times very lightly.  Mom stated this was how he generally feeds. Noted uric acid crystals in diaper.   Hand expressed 1 ml colostrum and spoon fed baby. Set up DEBP and instructed Mom to pump after breastfeeding for 15 mins on initiation setting.  Encouraged STS, and breast massage and hand expression.  Mom knows to spoon feed any colostrum expressed to baby. Recommended offering Alimentum 10-20 ml after breastfeeding, while baby is unable to transfer at the breast.  RN to teach pace method of bottle feeding.   Encourage STS and cue based feedings.   LC to follow up prn and 3/18.   Consult Status Consult Status: Follow-up Date: 03/15/17 Follow-up type: In-patient    Katelyn Avila, Katelyn Avila 03/14/2017, 3:47 PM

## 2017-03-15 MED ORDER — OXYCODONE HCL 5 MG PO TABS: 5.0000 mg | ORAL_TABLET | ORAL | 0 refills | Status: DC | PRN

## 2017-03-15 MED ORDER — AMLODIPINE BESYLATE 10 MG PO TABS: 5.0000 mg | ORAL_TABLET | Freq: Every day | ORAL | 2 refills | Status: DC

## 2017-03-15 MED ORDER — HYDROCHLOROTHIAZIDE 25 MG PO TABS: 25.0000 mg | ORAL_TABLET | Freq: Once | ORAL | Status: DC

## 2017-03-15 NOTE — Lactation Note (Signed)
This note was copied from a baby's chart. Lactation Consultation Note  Patient Name: Boy Katelyn Avila WUJWJ'XToday's Date: 03/15/2017    Mom's breasts are getting fuller & she is leaking through her shirt. She & infant are preparing to go home and infant was satiated from previous bottle feed. She was wanting a nipple shield for home to try & latch (the nipple shield from the day before may have been thrown away inadvertently), but I hesitated to give a nipple shield when "Katelyn Avila" had not transferred milk through a nipple shield the day before. In addition, the nipple diameter on her R nipple now suggests the need for a size 24 instead of the size 20 that was used previously.   Mom inquired about something to help her nipples to be more erect. I placed Mom in shells and encouraged her to wear them between feedings. Mom is now interested in having an outpatient appt. She chose an appt for 8 days from now.   Mom knows to pump every few hours, especially when Katelyn Avila has just received formula. Mom has our phone # for post-discharge questions.   Lurline HareRichey, Brave Dack [facility] 03/15/2017, 1:54 PM

## 2017-03-15 NOTE — Progress Notes (Signed)
Discharge instructions reviewed with patient.  Patient states understanding of home care for self and baby, medications, activity, signs/symptoms to report to MD and return MD office visits for both.  Patients significant other and family will assist with her care @ home.  No home  equipment needed, patient has prescriptions and all personal belongings.  Patient ambulated for discharge in stable condition with staff without incident.  Baby discharged home with parents.

## 2017-03-15 NOTE — Discharge Summary (Signed)
OB Discharge Summary  Patient Name: Katelyn Avila DOB: 05/05/1986 MRN: [id number]  Date of admission: 03/12/2017 Delivering MD: Catalina Antigua   Date of discharge: 03/15/2017  Admitting diagnosis: 38wks blood pressure Intrauterine pregnancy: [redacted]w[redacted]d     Secondary diagnosis:Principal Problem:   S/P cesarean section Active Problems:   Chronic hypertension during pregnancy, antepartum   Previous cesarean section complicating pregnancy   Gestational diabetes mellitus, class A2   Obesity  Additional problems:Same     Discharge diagnosis: Term Pregnancy Delivered, CHTN with superimposed preeclampsia, GDM A2 and Anemia                                                                     Post partum procedures:Magnesium Sulfate x 24 hours post delivery  Augmentation: N/A  Complications: None  Hospital course:  Sceduled C/S   31 y.o. yo G2P0101 at [redacted]w[redacted]d was admitted to the hospital 03/12/2017 for elevated blood pressures which were new and she was scheduled for a repeat cesarean section with the following indication:Elective Repeat.  Membrane Rupture Time/Date: 10:16 PM ,03/12/2017   Patient delivered a Viable female infant.03/12/2017  Details of operation can be found in separate operative note.  Pateint had an uncomplicated postpartum course.  She is ambulating, tolerating a regular diet, passing flatus, and urinating well. Patient is discharged home in stable condition on  03/15/17         Physical exam  Vitals:   03/14/17 2030 03/14/17 2358 03/15/17 0350 03/15/17 0838  BP: (!) 158/87 134/76 (!) 145/80 137/80  Pulse: 89 80 99 90  Resp: 20 20 18 18   Temp: 99 F (37.2 C) 98.6 F (37 C) 98.4 F (36.9 C) 97.8 F (36.6 C)  TempSrc: Oral Oral Oral Oral  SpO2: 96%  99% 99%  Weight:      Height:       General: alert, cooperative and no distress Lochia: appropriate Uterine Fundus: firm, Non-tender Incision: dressing has some old blood on it, her PICO continues to vibrate, so  this was changed DVT Evaluation: No evidence of DVT seen on physical exam. Labs: Lab Results  Component Value Date   WBC 14.3 (H) 03/13/2017   HGB 10.4 (L) 03/13/2017   HCT 30.7 (L) 03/13/2017   MCV 93.3 03/13/2017   PLT 206 03/13/2017   CMP Latest Ref Rng & Units 03/12/2017  Glucose 65 - 99 mg/dL 68  BUN 6 - 20 mg/dL 12  Creatinine 4.09 - 8.11 mg/dL 9.14  Sodium 782 - 956 mmol/L 135  Potassium 3.5 - 5.1 mmol/L 3.9  Chloride 101 - 111 mmol/L 106  CO2 22 - 32 mmol/L 21(L)  Calcium 8.9 - 10.3 mg/dL 2.1(H)  Total Protein 6.5 - 8.1 g/dL 6.3(L)  Total Bilirubin 0.3 - 1.2 mg/dL 0.6  Alkaline Phos 38 - 126 U/L 103  AST 15 - 41 U/L 17  ALT 14 - 54 U/L 15    Discharge instruction: per After Visit Summary and "Baby and Me Booklet".  After Visit Meds:  Allergies as of 03/15/2017   No Known Allergies     Medication List    STOP taking these medications   aspirin EC 81 MG tablet   glyBURIDE 5 MG tablet Commonly  known as:  DIABETA   labetalol 200 MG tablet Commonly known as:  NORMODYNE     TAKE these medications   acetaminophen 500 MG tablet Commonly known as:  TYLENOL Take 1,000 mg by mouth every 6 (six) hours as needed for headache.   amLODipine 10 MG tablet Commonly known as:  NORVASC Take 0.5 tablets (5 mg total) by mouth daily. Start taking on:  03/16/2017   diphenhydrAMINE 25 MG tablet Commonly known as:  BENADRYL Take 25 mg by mouth at bedtime as needed for allergies.   ferrous sulfate 325 (65 FE) MG tablet Take 1 tablet (325 mg total) by mouth 2 (two) times daily with a meal.   omeprazole 20 MG capsule Commonly known as:  PRILOSEC Take 20 mg by mouth at bedtime.   oxyCODONE 5 MG immediate release tablet Commonly known as:  Oxy IR/ROXICODONE Take 1 tablet (5 mg total) by mouth every 4 (four) hours as needed (pain scale 4-7).   PRENATAL ADULT GUMMY/DHA/FA PO Take 2 tablets by mouth daily.       Diet: carb modified diet  Activity: Advance as  tolerated. Pelvic rest for 6 weeks.   [facility] Follow up Appt:No future appointments. Follow up visit: No Follow-up on file.  Postpartum contraception: Depo Provera  Newborn Data: Live born female  Birth Weight: 8 lb 9.9 oz (3909 g) APGAR: 8, 9  Baby Feeding: Breast Disposition:home with mother   03/15/2017 Reva Boresanya S Rudi Knippenberg, MD

## 2017-03-15 NOTE — Lactation Note (Addendum)
This note was copied from a baby's chart. Lactation Consultation Note  Patient Name: Katelyn Avila [id number]'X Date: 03/15/2017    Mom reports that it took 2-3 weeks for her milk to come to volume with her 1st baby & that she did not have a full supply once it did. Mom reports that she lost a lot of blood with her 1st infant. However, Mom's breasts feel heavier to her today & I was able to express colostrum pretty easily. Mom pleased. Mom plans to continue pumping at home. She has a Medela DEBP at home & size 24 flanges seem to be the appropriate size at this time. Mom shown how to assemble & use hand pump that was included in pump kit.   Mom has been putting infant to the breast & then bottle-feeding formula. Mom has a tiny scab on her L nipple & reports nipple soreness. I told Mom that she can continue offering the breast, but if she just wants to pump & BO for the time being (feeding ad lib with formula), that is fine. Mom implied that would be her method of feeding and for that reason, she  is not interested in making an outpatient [facility] visit at this time.  Parents understand not to limit Jeremiah's volumes & to allow him to eat as he desires. Mom reports that they are no longer seeing uric acid crystals in his diapers. Mom understands that if she chooses to stop providing breast milk, then she will need to go to a different formula that has lactose (Mom is aware that choice of formula may also be addressed at pediatrician visit tomorrow).   Mom is taking Norvasc (L3). Mom also has PCOS.  [name] 03/15/2017, 10:00 AM

## 2017-03-15 NOTE — Discharge Instructions (Signed)
DASH Eating Plan DASH stands for "Dietary Approaches to Stop Hypertension." The DASH eating plan is a healthy eating plan that has been shown to reduce high blood pressure (hypertension). It may also reduce your risk for type 2 diabetes, heart disease, and stroke. The DASH eating plan may also help with weight loss. What are tips for following this plan? General guidelines   Avoid eating more than 2,300 mg (milligrams) of salt (sodium) a day. If you have hypertension, you may need to reduce your sodium intake to 1,500 mg a day.  Limit alcohol intake to no more than 1 drink a day for nonpregnant women and 2 drinks a day for men. One drink equals 12 oz of beer, 5 oz of wine, or 1 oz of hard liquor.  Work with your health care provider to maintain a healthy body weight or to lose weight. Ask what an ideal weight is for you.  Get at least 30 minutes of exercise that causes your heart to beat faster (aerobic exercise) most days of the week. Activities may include walking, swimming, or biking.  Work with your health care provider or diet and nutrition specialist (dietitian) to adjust your eating plan to your individual calorie needs. Reading food labels   Check food labels for the amount of sodium per serving. Choose foods with less than 5 percent of the Daily Value of sodium. Generally, foods with less than 300 mg of sodium per serving fit into this eating plan.  To find whole grains, look for the word "whole" as the first word in the ingredient list. Shopping   Buy products labeled as "low-sodium" or "no salt added."  Buy fresh foods. Avoid canned foods and premade or frozen meals. Cooking   Avoid adding salt when cooking. Use salt-free seasonings or herbs instead of table salt or sea salt. Check with your health care provider or pharmacist before using salt substitutes.  Do not fry foods. Cook foods using healthy methods such as baking, boiling, grilling, and broiling instead.  Cook with  heart-healthy oils, such as olive, canola, soybean, or sunflower oil. Meal planning    Eat a balanced diet that includes:  5 or more servings of fruits and vegetables each day. At each meal, try to fill half of your plate with fruits and vegetables.  Up to 6-8 servings of whole grains each day.  Less than 6 oz of lean meat, poultry, or fish each day. A 3-oz serving of meat is about the same size as a deck of cards. One egg equals 1 oz.  2 servings of low-fat dairy each day.  A serving of nuts, seeds, or beans 5 times each week.  Heart-healthy fats. Healthy fats called Omega-3 fatty acids are found in foods such as flaxseeds and coldwater fish, like sardines, salmon, and mackerel.  Limit how much you eat of the following:  Canned or prepackaged foods.  Food that is high in trans fat, such as fried foods.  Food that is high in saturated fat, such as fatty meat.  Sweets, desserts, sugary drinks, and other foods with added sugar.  Full-fat dairy products.  Do not salt foods before eating.  Try to eat at least 2 vegetarian meals each week.  Eat more home-cooked food and less restaurant, buffet, and fast food.  When eating at a restaurant, ask that your food be prepared with less salt or no salt, if possible. What foods are recommended? The items listed may not be a complete list. Talk  with your dietitian about what dietary choices are best for you. Grains  Whole-grain or whole-wheat bread. Whole-grain or whole-wheat pasta. Brown rice. [place]. Bulgur. Whole-grain and low-sodium cereals. Pita bread. Low-fat, low-sodium crackers. Whole-wheat flour tortillas. Vegetables  Fresh or frozen vegetables (raw, steamed, roasted, or grilled). Low-sodium or reduced-sodium tomato and vegetable juice. Low-sodium or reduced-sodium tomato sauce and tomato paste. Low-sodium or reduced-sodium canned vegetables. Fruits  All fresh, dried, or frozen fruit. Canned fruit in natural juice  (without added sugar). Meat and other protein foods  Skinless chicken or [place]. Ground chicken or [place]. Pork with fat trimmed off. Fish and seafood. Egg whites. Dried beans, peas, or lentils. Unsalted nuts, nut butters, and seeds. Unsalted canned beans. Lean cuts of beef with fat trimmed off. Low-sodium, lean deli meat. Dairy  Low-fat (1%) or fat-free (skim) milk. Fat-free, low-fat, or reduced-fat cheeses. Nonfat, low-sodium ricotta or cottage cheese. Low-fat or nonfat yogurt. Low-fat, low-sodium cheese. Fats and oils  Soft margarine without trans fats. Vegetable oil. Low-fat, reduced-fat, or light mayonnaise and salad dressings (reduced-sodium). Canola, safflower, olive, soybean, and sunflower oils. Avocado. Seasoning and other foods  Herbs. Spices. Seasoning mixes without salt. Unsalted popcorn and pretzels. Fat-free sweets. What foods are not recommended? The items listed may not be a complete list. Talk with your dietitian about what dietary choices are best for you. Grains  Baked goods made with fat, such as croissants, muffins, or some breads. Dry pasta or rice meal packs. Vegetables  Creamed or fried vegetables. Vegetables in a cheese sauce. Regular canned vegetables (not low-sodium or reduced-sodium). Regular canned tomato sauce and paste (not low-sodium or reduced-sodium). Regular tomato and vegetable juice (not low-sodium or reduced-sodium). Angie Fava. Olives. Fruits  Canned fruit in a light or heavy syrup. Fried fruit. Fruit in cream or butter sauce. Meat and other protein foods  Fatty cuts of meat. Ribs. Fried meat. Berniece Salines. Sausage. Bologna and other processed lunch meats. Salami. Fatback. Hotdogs. Bratwurst. Salted nuts and seeds. Canned beans with added salt. Canned or smoked fish. Whole eggs or egg yolks. Chicken or [place] with skin. Dairy  Whole or 2% milk, cream, and half-and-half. Whole or full-fat cream cheese. Whole-fat or sweetened yogurt. Full-fat cheese. Nondairy creamers.  Whipped toppings. Processed cheese and cheese spreads. Fats and oils  Butter. Stick margarine. Lard. Shortening. Ghee. Bacon fat. Tropical oils, such as coconut, palm kernel, or palm oil. Seasoning and other foods  Salted popcorn and pretzels. Onion salt, garlic salt, seasoned salt, table salt, and sea salt. Worcestershire sauce. Tartar sauce. Barbecue sauce. Teriyaki sauce. Soy sauce, including reduced-sodium. Steak sauce. Canned and packaged gravies. Fish sauce. Oyster sauce. Cocktail sauce. Horseradish that you find on the shelf. Ketchup. Mustard. Meat flavorings and tenderizers. Bouillon cubes. Hot sauce and Tabasco sauce. Premade or packaged marinades. Premade or packaged taco seasonings. Relishes. Regular salad dressings. Where to find more information:  National Heart, Lung, and [place]: https://wilson-eaton.com/  American Heart Association: www.heart.org Summary  The DASH eating plan is a healthy eating plan that has been shown to reduce high blood pressure (hypertension). It may also reduce your risk for type 2 diabetes, heart disease, and stroke.  With the DASH eating plan, you should limit salt (sodium) intake to 2,300 mg a day. If you have hypertension, you may need to reduce your sodium intake to 1,500 mg a day.  When on the DASH eating plan, aim to eat more fresh fruits and vegetables, whole grains, lean proteins, low-fat dairy, and heart-healthy fats.  Work  with your health care provider or diet and nutrition specialist (dietitian) to adjust your eating plan to your individual calorie needs. This information is not intended to replace advice given to you by your health care provider. Make sure you discuss any questions you have with your health care provider. Document Released: 12/04/2011 Document Revised: 12/08/2016 Document Reviewed: 12/08/2016 Elsevier Interactive Patient Education  2017 [employer]. Carbohydrate Counting for Diabetes Mellitus, Adult Carbohydrate counting  is a method for keeping track of how many carbohydrates you eat. Eating carbohydrates naturally increases the amount of sugar (glucose) in the blood. Counting how many carbohydrates you eat helps keep your blood glucose within normal limits, which helps you manage your diabetes (diabetes mellitus). It is important to know how many carbohydrates you can safely have in each meal. This is different for every person. A diet and nutrition specialist (registered dietitian) can help you make a meal plan and calculate how many carbohydrates you should have at each meal and snack. Carbohydrates are found in the following foods:  Grains, such as breads and cereals.  Dried beans and soy products.  Starchy vegetables, such as potatoes, peas, and corn.  Fruit and fruit juices.  Milk and yogurt.  Sweets and snack foods, such as cake, cookies, candy, chips, and soft drinks. How do I count carbohydrates? There are two ways to count carbohydrates in food. You can use either of the methods or a combination of both. Reading "Nutrition Facts" on packaged food  The "Nutrition Facts" list is included on the labels of almost all packaged foods and beverages in the [place] It includes:  The serving size.  Information about nutrients in each serving, including the grams (g) of carbohydrate per serving. To use the Nutrition Facts":  Decide how many servings you will have.  Multiply the number of servings by the number of carbohydrates per serving.  The resulting number is the total amount of carbohydrates that you will be having. Learning standard serving sizes of other foods  When you eat foods containing carbohydrates that are not packaged or do not include "Nutrition Facts" on the label, you need to measure the servings in order to count the amount of carbohydrates:  Measure the foods that you will eat with a food scale or measuring cup, if needed.  Decide how many standard-size servings you will  eat.  Multiply the number of servings by 15. Most carbohydrate-rich foods have about 15 g of carbohydrates per serving.  For example, if you eat 8 oz (170 g) of strawberries, you will have eaten 2 servings and 30 g of carbohydrates (2 servings x 15 g = 30 g).  For foods that have more than one food mixed, such as soups and casseroles, you must count the carbohydrates in each food that is included. The following list contains standard serving sizes of common carbohydrate-rich foods. Each of these servings has about 15 g of carbohydrates:   hamburger bun or  English muffin.   oz (15 mL) syrup.   oz (14 g) jelly.  1 slice of bread.  1 six-inch tortilla.  3 oz (85 g) cooked rice or pasta.  4 oz (113 g) cooked dried beans.  4 oz (113 g) starchy vegetable, such as peas, corn, or potatoes.  4 oz (113 g) hot cereal.  4 oz (113 g) mashed potatoes or  of a large baked potato.  4 oz (113 g) canned or frozen fruit.  4 oz (120 mL) fruit juice.  4-6 crackers.  6 chicken nuggets.  6 oz (170 g) unsweetened dry cereal.  6 oz (170 g) plain fat-free yogurt or yogurt sweetened with artificial sweeteners.  8 oz (240 mL) milk.  8 oz (170 g) fresh fruit or one small piece of fruit.  24 oz (680 g) popped popcorn. Example of carbohydrate counting Sample meal   3 oz (85 g) chicken breast.  6 oz (170 g) brown rice.  4 oz (113 g) corn.  8 oz (240 mL) milk.  8 oz (170 g) strawberries with sugar-free whipped topping. Carbohydrate calculation  1. Identify the foods that contain carbohydrates:  Rice.  Corn.  Milk.  Strawberries. 2. Calculate how many servings you have of each food:  2 servings rice.  1 serving corn.  1 serving milk.  1 serving strawberries. 3. Multiply each number of servings by 15 g:  2 servings rice x 15 g = 30 g.  1 serving corn x 15 g = 15 g.  1 serving milk x 15 g = 15 g.  1 serving strawberries x 15 g = 15 g. 4. Add together all of the  amounts to find the total grams of carbohydrates eaten:  30 g + 15 g + 15 g + 15 g = 75 g of carbohydrates total. This information is not intended to replace advice given to you by your health care provider. Make sure you discuss any questions you have with your health care provider. Document Released: 12/15/2005 Document Revised: 07/04/2016 Document Reviewed: 05/28/2016 Elsevier Interactive Patient Education  2017 Elsevier Inc. Cesarean Delivery, Care After Refer to this sheet in the next few weeks. These instructions provide you with information about caring for yourself after your procedure. Your health care provider may also give you more specific instructions. Your treatment has been planned according to current medical practices, but problems sometimes occur. Call your health care provider if you have any problems or questions after your procedure. What can I expect after the procedure? After the procedure, it is common to have:  A small amount of blood or clear fluid coming from the incision.  Some redness, swelling, and pain in your incision area.  Some abdominal pain and soreness.  Vaginal bleeding (lochia).  Pelvic cramps.  Fatigue. Follow these instructions at home: Incision care    Follow instructions from your health care provider about how to take care of your incision. Make sure you:  Wash your hands with soap and water before you change your bandage (dressing). If soap and water are not available, use hand sanitizer.  Change your dressing as told by your health care provider.  Leave stitches (sutures), skin staples, skin glue, or adhesive strips in place. These skin closures may need to stay in place for 2 weeks or longer. If adhesive strip edges start to loosen and curl up, you may trim the loose edges. Do not remove adhesive strips completely unless your health care provider tells you to do that.  Check your incision area every day for signs of infection. Check  for:  More redness, swelling, or pain.  More fluid or blood.  Warmth.  Pus or a bad smell.  When you cough or sneeze, hug a pillow. This helps with pain and decreases the chance of your incision opening up (dehiscing). Do this until your incision heals. Medicines   Take over-the-counter and prescription medicines only as told by your health care provider.  If you were prescribed an antibiotic medicine, take it as told by your  health care provider. Do not stop taking the antibiotic until it is finished. Driving   Do not drive or operate heavy machinery while taking prescription pain medicine.  Do not drive for 24 hours if you received a sedative. Lifestyle   Do not drink alcohol. This is especially important if you are breastfeeding or taking pain medicine.  Do not use tobacco products, including cigarettes, chewing tobacco, or e-cigarettes. If you need help quitting, ask your health care provider. Tobacco can delay wound healing. Eating and drinking   Drink at least 8 eight-ounce glasses of water every day unless told not to by your health care provider. If you breastfeed, you may need to drink more water than this.  Eat high-fiber foods every day. These foods may help prevent or relieve constipation. High-fiber foods include:  Whole grain cereals and breads.  Brown rice.  Beans.  Fresh fruits and vegetables. Activity   Return to your normal activities as told by your health care provider. Ask your health care provider what activities are safe for you.  Rest as much as possible. Try to rest or take a nap while your baby is sleeping.  Do not lift anything that is heavier than your baby or 10 lb (4.5 kg) as told by your health care provider.  Ask your health care provider when you can engage in sexual activity. This may depend on your:  Risk of infection.  Healing rate.  Comfort and desire to engage in sexual activity. Bathing   Do not take baths, swim, or use a  hot tub until your health care provider approves. Ask your health care provider if you can take showers. You may only be allowed to take sponge baths until your incision heals.  Keep your dressing dry as told by your health care provider. General instructions   Do not use tampons or douches until your health care provider approves.  Wear:  Loose, comfortable clothing.  A supportive and well-fitting bra.  Watch for any blood clots that may pass from your vagina. These may look like clumps of dark red, brown, or black discharge.  Keep your perineum clean and dry as told by your health care provider.  Wipe from front to back when you use the toilet.  If possible, have someone help you care for your baby and help with household activities for a few days after you leave the hospital.  Keep all follow-up visits for you and your baby as told by your health care provider. This is important. Contact a health care provider if:  You have:  Bad-smelling vaginal discharge.  Difficulty urinating.  Pain when urinating.  A sudden increase or decrease in the frequency of your bowel movements.  More redness, swelling, or pain around your incision.  More fluid or blood coming from your incision.  Pus or a bad smell coming from your incision.  A fever.  A rash.  Little or no interest in activities you used to enjoy.  Questions about caring for yourself or your baby.  Nausea.  Your incision feels warm to the touch.  Your breasts turn red or become painful or hard.  You feel unusually sad or worried.  You vomit.  You pass large blood clots from your vagina. If you pass a blood clot, save it to show to your health care provider. Do not flush blood clots down the toilet without showing your health care provider.  You urinate more than usual.  You are dizzy or light-headed.  You have not breastfed and have not had a menstrual period for 12 weeks after delivery.  You stopped  breastfeeding and have not had a menstrual period for 12 weeks after stopping breastfeeding. Get help right away if:  You have:  Pain that does not go away or get better with medicine.  Chest pain.  Difficulty breathing.  Blurred vision or spots in your vision.  Thoughts about hurting yourself or your baby.  New pain in your abdomen or in one of your legs.  A severe headache.  You faint.  You bleed from your vagina so much that you fill two sanitary pads in one hour. This information is not intended to replace advice given to you by your health care provider. Make sure you discuss any questions you have with your health care provider. Document Released: 09/06/2002 Document Revised: 04/24/2016 Document Reviewed: 11/19/2015 Elsevier Interactive Patient Education  2017 [employer].

## 2017-03-16 ENCOUNTER — Inpatient Hospital Stay (HOSPITAL_COMMUNITY)
Admission: RE | Admit: 2017-03-16 | Payer: PRIVATE HEALTH INSURANCE | Source: Ambulatory Visit | Admitting: Obstetrics & Gynecology

## 2017-03-16 ENCOUNTER — Encounter (HOSPITAL_COMMUNITY): Admission: RE | Payer: Self-pay | Source: Ambulatory Visit

## 2017-03-16 SURGERY — Surgical Case
Anesthesia: Regional

## 2017-03-19 ENCOUNTER — Encounter: Payer: Self-pay | Admitting: Obstetrics & Gynecology

## 2017-03-19 ENCOUNTER — Ambulatory Visit (INDEPENDENT_AMBULATORY_CARE_PROVIDER_SITE_OTHER): Payer: PRIVATE HEALTH INSURANCE | Admitting: Obstetrics & Gynecology

## 2017-03-19 VITALS — BP 150/80 | HR 76 | Wt 234.0 lb

## 2017-03-19 DIAGNOSIS — Z98891 History of uterine scar from previous surgery: Secondary | ICD-10-CM | POA: Diagnosis not present

## 2017-03-19 MED ORDER — HYDROCODONE-ACETAMINOPHEN 5-325 MG PO TABS: 1.0000 | ORAL_TABLET | Freq: Four times a day (QID) | ORAL | 0 refills | Status: DC | PRN

## 2017-03-19 NOTE — Progress Notes (Signed)
  HPI: Patient returns for routine postoperative follow-up having undergone repeat Caesarean section on 03/12/2017.  The patient's immediate postoperative recovery has been unremarkable. Since hospital discharge the patient reports no problems.   Current Outpatient Prescriptions: acetaminophen (TYLENOL) 500 MG tablet, Take 1,000 mg by mouth every 6 (six) hours as needed for headache., Disp: , Rfl:  amLODipine (NORVASC) 10 MG tablet, Take 0.5 tablets (5 mg total) by mouth daily., Disp: 30 tablet, Rfl: 2 diphenhydrAMINE (BENADRYL) 25 MG tablet, Take 25 mg by mouth at bedtime as needed for allergies., Disp: , Rfl:  ferrous sulfate 325 (65 FE) MG tablet, Take 1 tablet (325 mg total) by mouth 2 (two) times daily with a meal., Disp: 60 tablet, Rfl: 3 omeprazole (PRILOSEC) 20 MG capsule, Take 20 mg by mouth at bedtime., Disp: , Rfl:  oxyCODONE (OXY IR/ROXICODONE) 5 MG immediate release tablet, Take 1 tablet (5 mg total) by mouth every 4 (four) hours as needed (pain scale 4-7)., Disp: 30 tablet, Rfl: 0 Prenatal MV & Min w/FA-DHA (PRENATAL ADULT GUMMY/DHA/FA PO), Take 2 tablets by mouth daily., Disp: , Rfl:   No current facility-administered medications for this visit.     Blood pressure (!) 150/80, pulse 76, weight 234 lb (106.1 kg).  Physical Exam: Incision clean dry intact Abdomen soft non tender  Diagnostic Tests:   Pathology:   Impression: s/p repeat Caesarean section  Plan:   Follow up: 5 weeks pp exam  Meds ordered this encounter  Medications  . HYDROcodone-acetaminophen (NORCO/VICODIN) 5-325 MG tablet    Sig: Take 1 tablet by mouth every 6 (six) hours as needed.    Dispense:  30 tablet    Refill:  0     Lazaro ArmsEURE,LUTHER H, MD

## 2017-03-25 ENCOUNTER — Emergency Department (HOSPITAL_COMMUNITY)
Admission: EM | Admit: 2017-03-25 | Discharge: 2017-03-25 | Disposition: A | Discharge status: Home / Self Care | Payer: PRIVATE HEALTH INSURANCE | Attending: Emergency Medicine | Admitting: Emergency Medicine

## 2017-03-25 ENCOUNTER — Encounter (HOSPITAL_COMMUNITY): Payer: Self-pay | Admitting: *Deleted

## 2017-03-25 ENCOUNTER — Encounter: Payer: Self-pay | Admitting: *Deleted

## 2017-03-25 ENCOUNTER — Encounter: Payer: Self-pay | Admitting: Obstetrics and Gynecology

## 2017-03-25 ENCOUNTER — Ambulatory Visit (INDEPENDENT_AMBULATORY_CARE_PROVIDER_SITE_OTHER): Payer: PRIVATE HEALTH INSURANCE | Admitting: Obstetrics and Gynecology

## 2017-03-25 VITALS — BP 148/90 | HR 76 | Wt 227.0 lb

## 2017-03-25 DIAGNOSIS — O165 Unspecified maternal hypertension, complicating the puerperium: Secondary | ICD-10-CM

## 2017-03-25 DIAGNOSIS — Z79899 Other long term (current) drug therapy: Secondary | ICD-10-CM | POA: Insufficient documentation

## 2017-03-25 DIAGNOSIS — I10 Essential (primary) hypertension: Secondary | ICD-10-CM

## 2017-03-25 DIAGNOSIS — Z87891 Personal history of nicotine dependence: Secondary | ICD-10-CM | POA: Diagnosis not present

## 2017-03-25 DIAGNOSIS — R519 Headache, unspecified: Secondary | ICD-10-CM

## 2017-03-25 DIAGNOSIS — R51 Headache: Secondary | ICD-10-CM

## 2017-03-25 LAB — URINALYSIS, ROUTINE W REFLEX MICROSCOPIC
Bilirubin Urine: NEGATIVE
Glucose, UA: NEGATIVE mg/dL
Ketones, ur: NEGATIVE mg/dL
Nitrite: NEGATIVE
PROTEIN: NEGATIVE mg/dL
SPECIFIC GRAVITY, URINE: 1.011 (ref 1.005–1.030)
pH: 6 (ref 5.0–8.0)

## 2017-03-25 LAB — COMPREHENSIVE METABOLIC PANEL
ALBUMIN: 3.8 g/dL (ref 3.5–5.0)
ALT: 27 U/L (ref 14–54)
AST: 25 U/L (ref 15–41)
Alkaline Phosphatase: 69 U/L (ref 38–126)
Anion gap: 9 (ref 5–15)
BILIRUBIN TOTAL: 0.4 mg/dL (ref 0.3–1.2)
BUN: 15 mg/dL (ref 6–20)
CHLORIDE: 102 mmol/L (ref 101–111)
CO2: 26 mmol/L (ref 22–32)
CREATININE: 0.74 mg/dL (ref 0.44–1.00)
Calcium: 9.2 mg/dL (ref 8.9–10.3)
GFR calc Af Amer: >60 mL/min (ref >60)
GLUCOSE: 101 mg/dL — AB (ref 65–99)
POTASSIUM: 3.8 mmol/L (ref 3.5–5.1)
Sodium: 137 mmol/L (ref 135–145)
Total Protein: 6.8 g/dL (ref 6.5–8.1)

## 2017-03-25 LAB — CBC
HCT: 36.3 % (ref 36.0–46.0)
Hemoglobin: 12 g/dL (ref 12.0–15.0)
MCH: 30.8 pg (ref 26.0–34.0)
MCHC: 33.1 g/dL (ref 30.0–36.0)
MCV: 93.3 fL (ref 78.0–100.0)
Platelets: 319 10*3/uL (ref 150–400)
RBC: 3.89 MIL/uL (ref 3.87–5.11)
RDW: 13.9 % (ref 11.5–15.5)
WBC: 8.9 10*3/uL (ref 4.0–10.5)

## 2017-03-25 MED ORDER — KETOROLAC TROMETHAMINE 60 MG/2ML IM SOLN
60.0000 mg | Freq: Once | INTRAMUSCULAR | Status: AC
  Administered 2017-03-25: 60 mg via INTRAMUSCULAR

## 2017-03-25 MED ORDER — KETOROLAC TROMETHAMINE 30 MG/ML IJ SOLN
30.0000 mg | Freq: Once | INTRAMUSCULAR | Status: DC
  Filled 2017-03-25: qty 1

## 2017-03-25 MED ORDER — METOCLOPRAMIDE HCL 5 MG/ML IJ SOLN
10.0000 mg | Freq: Once | INTRAMUSCULAR | Status: AC
  Administered 2017-03-25: 10 mg via INTRAVENOUS
  Filled 2017-03-25: qty 2

## 2017-03-25 MED ORDER — KETOROLAC TROMETHAMINE 60 MG/2ML IM SOLN
INTRAMUSCULAR | Status: DC
Start: 2017-03-25 — End: 2017-03-25
  Filled 2017-03-25: qty 2

## 2017-03-25 NOTE — ED Provider Notes (Signed)
[facility] DEPT Provider Note   CSN: [id number] Arrival date & time: 03/25/17  0009  By signing my name below, I, Modena Jansky, attest that this documentation has been prepared under the direction and in the presence of Gilda Crease, MD. Electronically Signed: Modena Jansky, Scribe. 03/25/2017. 12:19 AM.  History   Chief Complaint Chief Complaint  Patient presents with  . Hypertension   The history is provided by the patient. No language interpreter was used.   HPI Comments: Katelyn Avila is a 31 y.o. female with a PMHx of HTN who presents to the Emergency Department complaining of constant moderate headache that started today. She was on placed on labetalol over the past month while she was pregnant. She had a C-section about a week ago. She suspects her headache is secondary to her high blood pressure. Her blood pressure in the ED was 135/94. She took hydrocodone with no relief. She has been breastfeeding. She admits to recent stress. She denies any recent problems with C-section incision site, visual disturbance, or other complaints.     PCP: Donzetta Sprung, MD  Past Medical History:  Diagnosis Date  . 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 Carpal tunnel syndrome, bilateral   . Cervical high risk HPV (human papillomavirus) test positive 07/25/2015  . Complication of anesthesia    states woke up during T & A  . Gestational diabetes   . Hidradenitis 07/23/2015  . Hypertension    under control "most of the time"; has been on med. since 09/2014  . Neuromuscular disorder (HCC)   . Pain with urination 07/23/2015  . PCOS (polycystic ovarian syndrome)   . Seasonal allergies     Patient Active Problem List   Diagnosis Date Noted  . S/P cesarean section 03/13/2017  . Polyhydramnios 02/26/2017  . 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 Susceptible to varicella (non-immune), currently pregnant 08/18/2016  . Gestational diabetes mellitus, class A2 08/18/2016  . Chronic  hypertension during pregnancy, antepartum 08/12/2016  . Previous cesarean section complicating pregnancy 08/12/2016  . Polycystic ovaries 08/12/2016  . History of gestational diabetes in prior pregnancy, currently pregnant 08/12/2016  . Body mass index 39.0-39.9, adult 07/07/2016  . Dizziness and giddiness 07/07/2016  . Palpitations 07/07/2016  . Impulsiveness 06/04/2016  . Obesity 10/02/2015  . Cervical high risk HPV (human papillomavirus) test positive 07/25/2015  . Hidradenitis 07/23/2015  . Carpal tunnel syndrome 01/28/2015    Past Surgical History:  Procedure Laterality Date  . CARPAL TUNNEL RELEASE Right 02/22/2015   Procedure: RIGHT CARPAL TUNNEL RELEASE;  Surgeon: Betha Loa, MD;  Location: [facility];  Service: Orthopedics;  Laterality: Right;  . CESAREAN SECTION    . CESAREAN SECTION N/A 03/12/2017   Procedure: CESAREAN SECTION;  Surgeon: Catalina Antigua, MD;  Location: [facility];  Service: Obstetrics;  Laterality: N/A;  . TONSILLECTOMY AND ADENOIDECTOMY    . WISDOM TOOTH EXTRACTION      OB History    Gravida Para Term Preterm AB Living   2 1   1   1    SAB TAB Ectopic Multiple Live Births           1       Home Medications    Prior to Admission medications   Medication Sig Start Date End Date Taking? Authorizing Provider  acetaminophen (TYLENOL) 500 MG tablet Take 1,000 mg by mouth every 6 (six) hours as needed for headache.    Historical Provider, MD  amLODipine (NORVASC) 10 MG tablet  Take 0.5 tablets (5 mg total) by mouth daily. 03/16/17   Reva Bores, MD  diphenhydrAMINE (BENADRYL) 25 MG tablet Take 25 mg by mouth at bedtime as needed for allergies.    Historical Provider, MD  ferrous sulfate 325 (65 FE) MG tablet Take 1 tablet (325 mg total) by mouth 2 (two) times daily with a meal. 12/12/16   Cheral Marker, CNM  HYDROcodone-acetaminophen (NORCO/VICODIN) 5-325 MG tablet Take 1 tablet by mouth every 6 (six) hours as needed. 03/19/17    Lazaro Arms, MD  omeprazole (PRILOSEC) 20 MG capsule Take 20 mg by mouth at bedtime.    Historical Provider, MD  oxyCODONE (OXY IR/ROXICODONE) 5 MG immediate release tablet Take 1 tablet (5 mg total) by mouth every 4 (four) hours as needed (pain scale 4-7). 03/15/17   Reva Bores, MD  Prenatal MV & Min w/FA-DHA (PRENATAL ADULT GUMMY/DHA/FA PO) Take 2 tablets by mouth daily.    Historical Provider, MD    Family History Family History  Problem Relation Age of Onset  . Fibroids Mother   . Hypertension Mother   . Endometriosis Mother   . Hypertension Father   . Hypertension Brother   . Hypertension Maternal Grandmother   . Diabetes Maternal Grandmother   . COPD Maternal Grandmother   . Hypertension Maternal Grandfather   . Hypertension Paternal Grandmother   . Cancer Paternal Grandmother     breast  . Hypertension Paternal Grandfather     Social History Social History  Substance Use Topics  . Smoking status: Former Smoker    Packs/day: 0.50    Years: 13.00    Types: Cigarettes    Quit date: 07/11/2016  . Smokeless tobacco: Never Used  . Alcohol use No     Comment: 1-2 x/week and weekends-before pregnancy     Allergies   Patient has no known allergies.   Review of Systems Review of Systems  Eyes: Negative for visual disturbance.  Neurological: Positive for headaches.  All other systems reviewed and are negative.    Physical Exam Updated Vital Signs BP (!) 135/94   Pulse 77   Temp 98.7 F (37.1 C) (Oral)   Resp 20   Ht 5\' 5"  (1.651 m)   Wt 234 lb (106.1 kg)   SpO2 99%   BMI 38.94 kg/m   Physical Exam  Constitutional: She is oriented to person, place, and time. She appears well-developed and well-nourished. No distress.  HENT:  Head: Normocephalic and atraumatic.  Right Ear: Hearing normal.  Left Ear: Hearing normal.  Nose: Nose normal.  Mouth/Throat: Oropharynx is clear and moist and mucous membranes are normal.  Eyes: Conjunctivae and EOM are  normal. Pupils are equal, round, and reactive to light.  Neck: Normal range of motion. Neck supple.  Cardiovascular: Regular rhythm, S1 normal and S2 normal.  Exam reveals no gallop and no friction rub.   No murmur heard. Pulmonary/Chest: Effort normal and breath sounds normal. No respiratory distress. She exhibits no tenderness.  Abdominal: Soft. Normal appearance and bowel sounds are normal. There is no hepatosplenomegaly. There is no tenderness. There is no rebound, no guarding, no tenderness at McBurney's point and negative Murphy's sign. No hernia.  Well-healing transverse surgical incision. No signs of infection.   Musculoskeletal: Normal range of motion.  Neurological: She is alert and oriented to person, place, and time. She has normal strength. No cranial nerve deficit or sensory deficit. Coordination normal. GCS eye subscore is 4. GCS verbal subscore is  5. GCS motor subscore is 6.  Skin: Skin is warm, dry and intact. No rash noted. No cyanosis.  Psychiatric: She has a normal mood and affect. Her speech is normal and behavior is normal. Thought content normal.  Nursing note and vitals reviewed.    ED Treatments / Results  DIAGNOSTIC STUDIES: Oxygen Saturation is 99% on RA, normal by my interpretation.    COORDINATION OF CARE: 12:23 AM- Pt advised of plan for treatment and pt agrees.  Labs (all labs ordered are listed, but only abnormal results are displayed) Labs Reviewed  COMPREHENSIVE METABOLIC PANEL - Abnormal; Notable for the following:       Result Value   Glucose, Bld 101 (*)    All other components within normal limits  URINALYSIS, ROUTINE W REFLEX MICROSCOPIC - Abnormal; Notable for the following:    Hgb urine dipstick LARGE (*)    Leukocytes, UA LARGE (*)    Bacteria, UA RARE (*)    Squamous Epithelial / LPF 0-5 (*)    All other components within normal limits  CBC    EKG  EKG Interpretation None       Radiology No results  found.  Procedures Procedures (including critical care time)  Medications Ordered in ED Medications  ketorolac (TORADOL) 30 MG/ML injection 30 mg (not administered)  metoCLOPramide (REGLAN) injection 10 mg (not administered)     Initial Impression / Assessment and Plan / ED Course  I have reviewed the triage vital signs and the nursing notes.  Pertinent labs & imaging results that were available during my care of the patient were reviewed by me and considered in my medical decision making (see chart for details).     Patient presents to the emergency department for evaluation of elevated blood pressure and headache. Patient reports having a C-section March 22. Her pregnancy was complicated by chronic hypertension. She is on Norvasc at home, noticed her blood pressure was elevated today. This has been associated with diffuse, global headache. No vision change. No chest pain, shortness of breath, palpitations or syncope. Blood pressure is only mildly elevated here in the ER. Labs are normal. Reflexes are normal. No sign of preeclampsia. This is manifestation of her chronic blood pressure elevation. Will treat her headache and have follow-up with her OB/GYN. She is breast-feeding. She was counseled to pump and discard.  Final Clinical Impressions(s) / ED Diagnoses   Final diagnoses:  Essential hypertension  Acute nonintractable headache, unspecified headache type    New Prescriptions New Prescriptions   No medications on file  I personally performed the services described in this documentation, which was scribed in my presence. The recorded information has been reviewed and is accurate.    Gilda Creasehristopher J Leesa Leifheit, MD 03/25/17 Emeline [id number]

## 2017-03-25 NOTE — Progress Notes (Signed)
[facility] Visit  03/25/17          Patient name: Katelyn Pattersonshley W Guilbert MRN [id number]  Date of birth: Mar 21, 1986  CC & HPI:  Katelyn Avila is a 31 y.o. female presenting today for follow up following her ED visit last nightFor elevated blood pressures postpartum. Pt reports associated intermittent HA. Pt has tried Rx norvasc with no relief of her symptoms. She notes that she had a repeat C-section on 03/12/2017 with no complications since. Denies any other symptoms.   ROS:  ROS +Intermittent HA. Interestingly patient has had a prior episode of not being able to tolerate Norvasc but did well since delivery. She's lost almost 40 pounds since delivery. She previously stated that she had become edematous follow Norvasc  Pertinent History Reviewed:   Reviewed: Significant for A1DM, HTN, Medical         Past Medical History:  Diagnosis Date  . 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 Carpal tunnel syndrome, bilateral   . Cervical high risk HPV (human papillomavirus) test positive 07/25/2015  . Complication of anesthesia    states woke up during T & A  . Gestational diabetes   . Hidradenitis 07/23/2015  . Hypertension    under control "most of the time"; has been on med. since 09/2014  . Neuromuscular disorder (HCC)   . Pain with urination 07/23/2015  . PCOS (polycystic ovarian syndrome)   . Seasonal allergies                               Surgical Hx:    Past Surgical History:  Procedure Laterality Date  . CARPAL TUNNEL RELEASE Right 02/22/2015   Procedure: RIGHT CARPAL TUNNEL RELEASE;  Surgeon: Betha LoaKevin Kuzma, MD;  Location: [facility];  Service: Orthopedics;  Laterality: Right;  . CESAREAN SECTION    . CESAREAN SECTION N/A 03/12/2017   Procedure: CESAREAN SECTION;  Surgeon: Catalina AntiguaPeggy Constant, MD;  Location: [facility];  Service: Obstetrics;  Laterality: N/A;  . TONSILLECTOMY AND ADENOIDECTOMY    . WISDOM TOOTH EXTRACTION     Medications: Reviewed & Updated - see associated  section                       Current Outpatient Prescriptions:  .  acetaminophen (TYLENOL) 500 MG tablet, Take 1,000 mg by mouth every 6 (six) hours as needed for headache., Disp: , Rfl:  .  amLODipine (NORVASC) 10 MG tablet, Take 0.5 tablets (5 mg total) by mouth daily., Disp: 30 tablet, Rfl: 2 .  HYDROcodone-acetaminophen (NORCO/VICODIN) 5-325 MG tablet, Take 1 tablet by mouth every 6 (six) hours as needed., Disp: 30 tablet, Rfl: 0 .  Prenatal MV & Min w/FA-DHA (PRENATAL ADULT GUMMY/DHA/FA PO), Take 2 tablets by mouth daily., Disp: , Rfl:  .  diphenhydrAMINE (BENADRYL) 25 MG tablet, Take 25 mg by mouth at bedtime as needed for allergies., Disp: , Rfl:  .  ferrous sulfate 325 (65 FE) MG tablet, Take 1 tablet (325 mg total) by mouth 2 (two) times daily with a meal. (Patient not taking: Reported on 03/25/2017), Disp: 60 tablet, Rfl: 3 .  omeprazole (PRILOSEC) 20 MG capsule, Take 20 mg by mouth at bedtime., Disp: , Rfl:  .  oxyCODONE (OXY IR/ROXICODONE) 5 MG immediate release tablet, Take 1 tablet (5 mg total) by mouth every 4 (four) hours as needed (pain scale 4-7). (Patient not taking: Reported  on 03/25/2017), Disp: 30 tablet, Rfl: 0   Social History: Reviewed -  reports that she quit smoking about 8 months ago. Her smoking use included Cigarettes. She has a 6.50 pack-year smoking history. She has never used smokeless tobacco.  Objective Findings:  Vitals: Blood pressure (!) 148/90, pulse 76, weight 227 lb (103 kg), unknown if currently breastfeeding.  Physical Examination: discussion only  Discussion: 1. Discussed with pt risks and benefits of blood pressure and medications.   At end of discussion, pt had opportunity to ask questions and has no further questions at this time.   Specific discussion of blood pressure and medications as noted above. Greater than 50% was spent in counseling and coordination of care with the patient.   Total time greater than: 15 minutes.    Assessment &  Plan:   A:  1. Post partum hypertension, Not adequately controlled 2. Well healed surgical scar 3. ER follow up s/p HA  P:  1. Change increase norvasc prescription to 10 mg. daily.  2. Call in 5 days if HA doesn't resolve and will add procardia xl 30 if HA persists   By signing my name below, I, Soijett Blue, attest that this documentation has been prepared under the direction and in the presence of Tilda Burrow, MD. Electronically Signed: Soijett Blue, ED Scribe. 03/25/17. 2:36 PM.  I personally performed the services described in this documentation, which was SCRIBED in my presence. The recorded information has been reviewed and considered accurate. It has been edited as necessary during review. Tilda Burrow, MD

## 2017-03-25 NOTE — ED Triage Notes (Signed)
Pt reports having a headache most of the day with high BP as well. Pt just recently had a baby on 03-12-17. Pt had hypertension before getting pregnant and was taking lisinopril. Pt states she was placed on labetalol while she was pregnant and after her delivery they put her on amlodipine. Pt states this medicine isn't helping and didn't help the last time they put her on it.

## 2017-04-08 ENCOUNTER — Encounter: Payer: Self-pay | Admitting: Obstetrics and Gynecology

## 2017-04-08 ENCOUNTER — Ambulatory Visit (INDEPENDENT_AMBULATORY_CARE_PROVIDER_SITE_OTHER): Payer: PRIVATE HEALTH INSURANCE | Admitting: Obstetrics and Gynecology

## 2017-04-08 DIAGNOSIS — F53 Postpartum depression: Secondary | ICD-10-CM

## 2017-04-08 DIAGNOSIS — O99345 Other mental disorders complicating the puerperium: Principal | ICD-10-CM

## 2017-04-08 MED ORDER — HYDROCODONE-ACETAMINOPHEN 5-325 MG PO TABS: 1.0000 | ORAL_TABLET | Freq: Four times a day (QID) | ORAL | 0 refills | Status: DC | PRN

## 2017-04-08 MED ORDER — VENLAFAXINE HCL ER 75 MG PO CP24: 75.0000 mg | ORAL_CAPSULE | Freq: Every day | ORAL | 3 refills | Status: DC

## 2017-04-08 NOTE — Progress Notes (Addendum)
° °  Subjective:  Katelyn Avila is a 31 y.o. female who presents for a 4 weeks postpartum visit.   Patient concerns: feeling depressed since delivery. She states the situation was more stressful and had less help with first child but states this postpartum course is worse for her. She is having trouble producing milk to breastfeed and is having breast pain as a result. She is currently taking hydrocodone but only has 1 pill left. She states she does not want to be around people and feels "like I am going crazy". She states she has no problem being around the baby and states "the problem is me". She reports possibly being on an anti-depressant 1 year ago but is unable to recall the name. Husband does not believe she is suicidal or that she would harm the baby.. Husband needs help getting FMLA so he can be with pt more.She seems to need it at present  Prenatal and intrapartum course notable for: repeat cesarean section due to chronic HTN superimposed by pre-eclampsia   Patient is not sexually active.   The following portions of the patient's history were reviewed and updated as appropriate: allergies, current medications, past family history, past medical history, past surgical history and problem list.  Review of Systems    See Subjective, otherwise negative ROS.  Objective:  BP 128/84 (BP Location: Right Arm, Patient Position: Sitting, Cuff Size: Large)    Pulse 68    Ht  (1.626 m)    Wt 234 lb (106.1 kg)    Breastfeeding? Yes    BMI 40.17 kg/m   General:  alert, cooperative and no distress     Lungs: clear to auscultation bilaterally  Heart:  regular rate and rhythm, S1, S2 normal, no murmur  Abdomen: soft, non-tender; bowel sounds normal; no masses,  no organomegaly  Pelvic: Not indicated                       Assessment:  1.  postpartum exam.  2. Contraception: abstinence 3. Postpartum depression 4. Resolved HTN 5. History of ADD    Plan:  1. Start Effexor 2. Refill  hydrocodone  3. Adderall: hold off on this Rx  4. Given note to husband [name]) to help seek FMLA   By signing my name below, I, Sonum Patel, attest that this documentation has been prepared under the direction and in the presence of Tilda Burrow, MD. Electronically Signed: Sonum Patel, Neurosurgeon. 04/08/17. 11:57 AM.  I personally performed the services described in this documentation, which was SCRIBED in my presence. The recorded information has been reviewed and considered accurate. It has been edited as necessary during review. Tilda Burrow, MD

## 2017-04-12 DIAGNOSIS — O99345 Other mental disorders complicating the puerperium: Principal | ICD-10-CM

## 2017-04-12 DIAGNOSIS — F53 Postpartum depression: Secondary | ICD-10-CM | POA: Insufficient documentation

## 2017-04-12 HISTORY — DX: Postpartum depression: F53.0

## 2017-04-20 ENCOUNTER — Encounter: Payer: Self-pay | Admitting: Women's Health

## 2017-04-20 ENCOUNTER — Ambulatory Visit (INDEPENDENT_AMBULATORY_CARE_PROVIDER_SITE_OTHER): Payer: PRIVATE HEALTH INSURANCE | Admitting: Women's Health

## 2017-04-20 DIAGNOSIS — F418 Other specified anxiety disorders: Secondary | ICD-10-CM

## 2017-04-20 DIAGNOSIS — Z8632 Personal history of gestational diabetes: Secondary | ICD-10-CM | POA: Diagnosis not present

## 2017-04-20 DIAGNOSIS — I10 Essential (primary) hypertension: Secondary | ICD-10-CM

## 2017-04-20 DIAGNOSIS — O1410 Severe pre-eclampsia, unspecified trimester: Secondary | ICD-10-CM | POA: Insufficient documentation

## 2017-04-20 MED ORDER — NORETHIN-ETH ESTRAD-FE BIPHAS 1 MG-10 MCG / 10 MCG PO TABS: 1.0000 | ORAL_TABLET | Freq: Every day | ORAL | 3 refills | Status: DC

## 2017-04-20 NOTE — Patient Instructions (Addendum)
You will have your sugar test next visit.  Please do not eat or drink anything after midnight the night before you come, not even water.  You will be here for at least two hours.     Constipation  Drink plenty of fluid, preferably water, throughout the day  Eat foods high in fiber such as fruits, vegetables, and grains  Exercise, such as walking, is a good way to keep your bowels regular  Drink warm fluids, especially warm prune juice, or decaf coffee  Eat a 1/2 cup of real oatmeal (not instant), 1/2 cup applesauce, and 1/2-1 cup warm prune juice every day  If needed, you may take Colace (docusate sodium) stool softener once or twice a day to help keep the stool soft. If you are pregnant, wait until you are out of your first trimester (12-14 weeks of pregnancy)  If you still are having problems with constipation, you may take Miralax once daily as needed to help keep your bowels regular.  If you are pregnant, wait until you are out of your first trimester (12-14 weeks of pregnancy)     Oral Contraception Use Oral contraceptive pills (OCPs) are medicines taken to prevent pregnancy. OCPs work by preventing the ovaries from releasing eggs. The hormones in OCPs also cause the cervical mucus to thicken, preventing the sperm from entering the uterus. The hormones also cause the uterine lining to become thin, not allowing a fertilized egg to attach to the inside of the uterus. OCPs are highly effective when taken exactly as prescribed. However, OCPs do not prevent sexually transmitted diseases (STDs). Safe sex practices, such as using condoms along with an OCP, can help prevent STDs. Before taking OCPs, you may have a physical exam and Pap test. Your health care provider may also order blood tests if necessary. Your health care provider will make sure you are a good candidate for oral contraception. Discuss with your health care provider the possible side effects of the OCP you may be prescribed. When  starting an OCP, it can take 2 to 3 months for the body to adjust to the changes in hormone levels in your body. How to take oral contraceptive pills Your health care provider may advise you on how to start taking the first cycle of OCPs. Otherwise, you can:  Start on day 1 of your menstrual period. You will not need any backup contraceptive protection with this start time.  Start on the first Sunday after your menstrual period or the day you get your prescription. In these cases, you will need to use backup contraceptive protection for the first week.  Start the pill at any time of your cycle. If you take the pill within 5 days of the start of your period, you are protected against pregnancy right away. In this case, you will not need a backup form of birth control. If you start at any other time of your menstrual cycle, you will need to use another form of birth control for 7 days. If your OCP is the type called a minipill, it will protect you from pregnancy after taking it for 2 days (48 hours). After you have started taking OCPs:  If you forget to take 1 pill, take it as soon as you remember. Take the next pill at the regular time.  If you miss 2 or more pills, call your health care provider because different pills have different instructions for missed doses. Use backup birth control until your next menstrual period  starts.  If you use a 28-day pack that contains inactive pills and you miss 1 of the last 7 pills (pills with no hormones), it will not matter. Throw away the rest of the non-hormone pills and start a new pill pack. No matter which day you start the OCP, you will always start a new pack on that same day of the week. Have an extra pack of OCPs and a backup contraceptive method available in case you miss some pills or lose your OCP pack. Follow these instructions at home:  Do not smoke.  Always use a condom to protect against STDs. OCPs do not protect against STDs.  Use a  calendar to mark your menstrual period days.  Read the information and directions that came with your OCP. Talk to your health care provider if you have questions. Contact a health care provider if:  You develop nausea and vomiting.  You have abnormal vaginal discharge or bleeding.  You develop a rash.  You miss your menstrual period.  You are losing your hair.  You need treatment for mood swings or depression.  You get dizzy when taking the OCP.  You develop acne from taking the OCP.  You become pregnant. Get help right away if:  You develop chest pain.  You develop shortness of breath.  You have an uncontrolled or severe headache.  You develop numbness or slurred speech.  You develop visual problems.  You develop pain, redness, and swelling in the legs. This information is not intended to replace advice given to you by your health care provider. Make sure you discuss any questions you have with your health care provider. Document Released: 12/04/2011 Document Revised: 05/22/2016 Document Reviewed: 06/05/2013 Elsevier Interactive Patient Education  2017 [employer].

## 2017-04-20 NOTE — Progress Notes (Signed)
Subjective:    Katelyn Avila is a 31 y.o. G31P1102 Caucasian female who presents for a postpartum visit. She is 5 weeks postpartum following a repeat cesarean section, low transverse incision at 38.3 gestational weeks d/t CHTN w/ superimposed pre-e. Also had A2/BDM. Anesthesia: spinal. I have fully reviewed the prenatal and intrapartum course. Was d/c'd home on Norvasc- taking as directed. Was on lisinopril/hctz prior to pregnancy-wants to resume this.  Postpartum course has been complicated by depression/anxiety, h/o ADHD- Dr. Emelda Fear started her on effexor  04/08/17. States she feels this has helped some, but feels she needs to be back on Adderall. Previously rx'd by Dr. Garner Nash PCP.  Baby's course has been complicated by reflux- on zantac. Baby is feeding by breast now bottle. Bleeding brown discharge. Bowel function is some constipation. Bladder function is normal. Patient is sexually active. Last sexual activity: 04/13/17. Contraception method is condoms and wants coc's. Does not smoke, no h/o DVT, PE, CVA, MI, or migraines w/ aura.  CHTN is well controlled. Postpartum depression screening: positive. Score 21.  Denies SI/HI/II. On Effexor  daily, feels she needs to get back on Adderall. Declines counseling/therapy at this time. Last pap 08/12/16 and was neg w/ +HRHPV.  The following portions of the patient's history were reviewed and updated as appropriate: allergies, current medications, past medical history, past surgical history and problem list.  Review of Systems Pertinent items are noted in HPI.   Vitals:   04/20/17 1118  BP: 122/80  Pulse: 76  Weight: 232 lb (105.2 kg)  Height:  (1.626 m)   No LMP recorded (lmp unknown).  Objective:   General:  alert, cooperative and no distress   Breasts:  deferred, no complaints  Lungs: clear to auscultation bilaterally  Heart:  regular rate and rhythm  Abdomen: soft, nontender, c/s incision well-healed   Vulva: normal  Vagina:  normal vagina  Cervix:  closed  Corpus: Well-involuted  Adnexa:  Non-palpable  Rectal Exam: No hemorrhoids        Assessment:   Postpartum exam 5 wks s/p RLTCS d/t CHTN w/ superimposed pre-e, A2/BDM CHTN Depression/anxiety/ADHD Bottlefeeding Depression screening Contraception counseling  Normal pap w/ +HRHPV  Plan:  Contraception: rx LoLoestrin 3pk w/ 3RF  Continue norvasc and effexor for right now Call Dr. Daniel/PCP to get appt asap to discuss getting back on Adderall and changing norvasc back to pre-pregnancy lisinopril/hctz Follow up in: 4 weeks for postpartum 2hr gtt (d/t A2/BDM) and f/u, then after 8/15 for pap  Call if changes mind and wants counseling/therapy  Marge Duncans CNM, [facility] 04/20/2017 11:28 AM

## 2017-05-15 ENCOUNTER — Telehealth: Payer: Self-pay | Admitting: Obstetrics and Gynecology

## 2017-05-15 NOTE — Telephone Encounter (Signed)
LMOVM to return call.

## 2017-05-18 ENCOUNTER — Other Ambulatory Visit: Payer: Medicaid Other

## 2017-05-18 ENCOUNTER — Ambulatory Visit: Payer: PRIVATE HEALTH INSURANCE | Admitting: Women's Health

## 2017-05-18 ENCOUNTER — Ambulatory Visit (INDEPENDENT_AMBULATORY_CARE_PROVIDER_SITE_OTHER): Payer: PRIVATE HEALTH INSURANCE | Admitting: Obstetrics and Gynecology

## 2017-05-18 ENCOUNTER — Encounter: Payer: Self-pay | Admitting: Obstetrics and Gynecology

## 2017-05-18 VITALS — BP 130/80 | HR 78 | Wt 238.0 lb

## 2017-05-18 DIAGNOSIS — O99345 Other mental disorders complicating the puerperium: Principal | ICD-10-CM

## 2017-05-18 DIAGNOSIS — F53 Postpartum depression: Secondary | ICD-10-CM

## 2017-05-18 DIAGNOSIS — Z8632 Personal history of gestational diabetes: Secondary | ICD-10-CM

## 2017-05-18 NOTE — Progress Notes (Signed)
[facility] Visit  05/18/17          Patient name: Katelyn Avila MRN [id number]  Date of birth: 1986/08/11  CC & HPI:  Katelyn Avila is a 31 y.o. female presenting today for postpartum depression. Pt reports associated lack of sleep. She notes that following delivery in March 2018, her depression is worsening. Pt states that she was started on effexor last month with no relief of her symptoms. Pt notes that she breastfed her baby for one month and then stopped when she started the effexor. She states that she had "mama blues" with her first child x 2 weeks that went away on its own. She reports that she has become quick-tempered and is now starting arguments with her spouse so that she can be alone. Mother states that she has no issues with the baby, and cares for him. Pt denies having people in her circle that she can discuss these with. Denies any other symptoms.such as SI or HI.  ROS:  ROS +postpartum depression +lack of sleep  Pertinent History Reviewed:   Reviewed: Significant for A1/B DM, HTN, Medical         Past Medical History:  Diagnosis Date  . 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 Carpal tunnel syndrome, bilateral   . Cervical high risk HPV (human papillomavirus) test positive 07/25/2015  . Complication of anesthesia    states woke up during T & A  . Gestational diabetes   . Hidradenitis 07/23/2015  . Hypertension    under control "most of the time"; has been on med. since 09/2014  . Neuromuscular disorder (HCC)   . Pain with urination 07/23/2015  . PCOS (polycystic ovarian syndrome)   . Seasonal allergies                               Surgical Hx:    Past Surgical History:  Procedure Laterality Date  . CARPAL TUNNEL RELEASE Right 02/22/2015   Procedure: RIGHT CARPAL TUNNEL RELEASE;  Surgeon: Betha Loa, MD;  Location: [facility];  Service: Orthopedics;  Laterality: Right;  . CESAREAN SECTION    . CESAREAN SECTION N/A 03/12/2017   Procedure: CESAREAN  SECTION;  Surgeon: Catalina Antigua, MD;  [facility];  Service: Obstetrics;  Laterality: N/A;  . TONSILLECTOMY AND ADENOIDECTOMY    . WISDOM TOOTH EXTRACTION     Medications: Reviewed & Updated - see associated section                       Current Outpatient Prescriptions:  .  lisinopril (PRINIVIL,ZESTRIL) 20 MG tablet, Take 20 mg by mouth daily., Disp: , Rfl:  .  Norethindrone-Ethinyl Estradiol-Fe Biphas (LO LOESTRIN FE) 1 MG-10 MCG / 10 MCG tablet, Take 1 tablet by mouth daily., Disp: 3 Package, Rfl: 3 .  venlafaxine XR (EFFEXOR-XR) 75 MG 24 hr capsule, Take 1 capsule (75 mg total) by mouth daily., Disp: 30 capsule, Rfl: 3   Social History: Reviewed -  reports that she quit smoking about 10 months ago. Her smoking use included Cigarettes. She has a 6.50 pack-year smoking history. She has never used smokeless tobacco.  Objective Findings:  Vitals: Blood pressure 130/80, pulse 78, weight 238 lb (108 kg), last menstrual period 05/16/2017, not currently breastfeeding.  Physical Examination: discussion only Discussion: 1. Discussed with pt risks and benefits of postpartum depression  At end of discussion,  pt had opportunity to ask questions and has no further questions at this time.   Specific discussion of postpartum depression as noted above. Greater than 50% was spent in counseling and coordination of care with the patient.   Total time greater than: 25 minutes.    Assessment & Plan:   A:  1. Postpartum depression stable , recurrent  P:  1. Continue effexor Rx 2. Follow up in 3 months for further evaluation of medications.  By signing my name below, I, Soijett Blue, attest that this documentation has been prepared under the direction and in the presence of Tilda BurrowFerguson, Deleon Passe V, MD. Electronically Signed: Soijett Blue, ED Scribe. 05/18/17. 11:20 AM.  I personally performed the services described in this documentation, which was SCRIBED in my presence. The recorded  information has been reviewed and considered accurate. It has been edited as necessary during review. Tilda BurrowFERGUSON,Harly Pipkins V, MD

## 2017-05-19 LAB — GLUCOSE TOLERANCE, 2 HOURS W/ 1HR
GLUCOSE, 2 HOUR: 72 mg/dL (ref 65–152)
GLUCOSE, FASTING: 90 mg/dL (ref 65–91)
Glucose, 1 hour: 156 mg/dL (ref 65–179)

## 2017-08-13 ENCOUNTER — Other Ambulatory Visit: Payer: Medicaid Other | Admitting: Adult Health

## 2017-08-20 ENCOUNTER — Other Ambulatory Visit: Payer: Medicaid Other | Admitting: Adult Health

## 2017-09-03 ENCOUNTER — Other Ambulatory Visit: Payer: Medicaid Other | Admitting: Adult Health

## 2017-09-15 ENCOUNTER — Ambulatory Visit (HOSPITAL_COMMUNITY): Payer: PRIVATE HEALTH INSURANCE | Admitting: Psychiatry

## 2017-09-15 NOTE — Progress Notes (Deleted)
Psychiatric Initial Adult Assessment   Patient Identification: Katelyn Avila MRN:  [id number] Date of Evaluation:  09/15/2017 Referral Source: *** Chief Complaint:   Visit Diagnosis: No diagnosis found.  History of Present Illness:   Katelyn Avila is a 31 year old female with postpartum depression (delivery in March 2018), hypertension, hydradenitis, who is referred for   Associated Signs/Symptoms: Depression Symptoms:  {DEPRESSION SYMPTOMS:20000} (Hypo) Manic Symptoms:  {BHH MANIC SYMPTOMS:22872} Anxiety Symptoms:  {BHH ANXIETY SYMPTOMS:22873} Psychotic Symptoms:  {BHH PSYCHOTIC SYMPTOMS:22874} PTSD Symptoms: {BHH PTSD SYMPTOMS:22875}  Past Psychiatric History:  Outpatient:  Psychiatry admission:  Previous suicide attempt:  Past trials of medication:  History of violence:   Previous Psychotropic Medications: {YES/NO:21197}  Substance Abuse History in the last 12 months:  {yes no:314532}  Consequences of Substance Abuse: {BHH CONSEQUENCES OF SUBSTANCE ABUSE:22880}  Past Medical History:  Past Medical History:  Diagnosis Date  . 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 Carpal tunnel syndrome, bilateral   . Cervical high risk HPV (human papillomavirus) test positive 07/25/2015  . Complication of anesthesia    states woke up during T & A  . Gestational diabetes   . Hidradenitis 07/23/2015  . Hypertension    under control "most of the time"; has been on med. since 09/2014  . Neuromuscular disorder (HCC)   . Pain with urination 07/23/2015  . PCOS (polycystic ovarian syndrome)   . Seasonal allergies     Past Surgical History:  Procedure Laterality Date  . CARPAL TUNNEL RELEASE Right 02/22/2015   Procedure: RIGHT CARPAL TUNNEL RELEASE;  Surgeon: Betha Loa, MD;  Location: [facility];  Service: Orthopedics;  Laterality: Right;  . CESAREAN SECTION    . CESAREAN SECTION N/A 03/12/2017   Procedure: CESAREAN SECTION;  Surgeon: Catalina Antigua, MD;  Location: [facility];   Service: Obstetrics;  Laterality: N/A;  . TONSILLECTOMY AND ADENOIDECTOMY    . WISDOM TOOTH EXTRACTION      Family Psychiatric History: ***  Family History:  Family History  Problem Relation Age of Onset  . Fibroids Mother   . Hypertension Mother   . Endometriosis Mother   . Hypertension Father   . Hypertension Brother   . Hypertension Maternal Grandmother   . Diabetes Maternal Grandmother   . COPD Maternal Grandmother   . Hypertension Maternal Grandfather   . Hypertension Paternal Grandmother   . Cancer Paternal Grandmother        breast  . Hypertension Paternal Grandfather     Social History:   Social History   Social History  . Marital status: Married    Spouse name: N/A  . Number of children: N/A  . Years of education: N/A   Social History Main Topics  . Smoking status: Former Smoker    Packs/day: 0.50    Years: 13.00    Types: Cigarettes    Quit date: 07/11/2016  . Smokeless tobacco: Never Used  . Alcohol use No     Comment: 1-2 x/week and weekends-before pregnancy  . Drug use: No  . Sexual activity: Yes    Birth control/ protection: None, Condom   Other Topics Concern  . Not on file   Social History Narrative  . No narrative on file    Additional Social History: ***  Allergies:  No Known Allergies  Metabolic Disorder Labs: Lab Results  Component Value Date   HGBA1C 6.2 (H) 08/12/2016   No results found for: PROLACTIN No results found for: CHOL, TRIG, HDL, CHOLHDL, VLDL, LDLCALC  Current Medications: Current Outpatient Prescriptions  Medication Sig Dispense Refill  . lisinopril (PRINIVIL,ZESTRIL) 20 MG tablet Take 20 mg by mouth daily.    . Norethindrone-Ethinyl Estradiol-Fe Biphas (LO LOESTRIN FE) 1 MG-10 MCG / 10 MCG tablet Take 1 tablet by mouth daily. 3 Package 3  . venlafaxine XR (EFFEXOR-XR) 75 MG 24 hr capsule Take 1 capsule (75 mg total) by mouth daily. 30 capsule 3   No current facility-administered medications for this visit.      Neurologic: Headache: No Seizure: No Paresthesias:No  Musculoskeletal: Strength & Muscle Tone: within normal limits Gait & Station: normal Patient leans: N/A  Psychiatric Specialty Exam: ROS  not currently breastfeeding.There is no height or weight on file to calculate BMI.  General Appearance: Fairly Groomed  Eye Contact:  Good  Speech:  Clear and Coherent  Volume:  Normal  Mood:  {BHH MOOD:22306}  Affect:  {Affect (PAA):22687}  Thought Process:  Coherent and Goal Directed  Orientation:  Full (Time, Place, and Person)  Thought Content:  Logical  Suicidal Thoughts:  {ST/HT (PAA):22692}  Homicidal Thoughts:  {ST/HT (PAA):22692}  Memory:  Immediate;   Good Recent;   Good Remote;   Good  Judgement:  {Judgement (PAA):22694}  Insight:  {Insight (PAA):22695}  Psychomotor Activity:  Normal  Concentration:  Concentration: Good and Attention Span: Good  Recall:  Good  Fund of Knowledge:Good  Language: Good  Akathisia:  No  Handed:  Right  AIMS (if indicated):  N/A  Assets:  Communication Skills Desire for Improvement  ADL's:  Intact  Cognition: WNL  Sleep:  ***   Assessment  Plan  The patient demonstrates the following risk factors for suicide: Chronic risk factors for suicide include: {Chronic Risk Factors for MWNUUVO:53664403}. Acute risk factors for suicide include: {Acute Risk Factors for KVQQVZD:63875643}. Protective factors for this patient include: {Protective Factors for Suicide PIRJ:18841660}. Considering these factors, the overall suicide risk at this point appears to be {Desc; low/moderate/high:110033}. Patient {ACTION; IS/IS YTK:16010932} appropriate for outpatient follow up.   Treatment Plan Summary: Plan as above   Neysa Hotter, MD 9/18/20188:06 AM

## 2017-09-29 ENCOUNTER — Other Ambulatory Visit: Payer: PRIVATE HEALTH INSURANCE | Admitting: Adult Health

## 2017-10-12 ENCOUNTER — Other Ambulatory Visit: Payer: PRIVATE HEALTH INSURANCE | Admitting: Adult Health

## 2017-10-21 ENCOUNTER — Other Ambulatory Visit: Payer: PRIVATE HEALTH INSURANCE | Admitting: Adult Health

## 2017-10-29 ENCOUNTER — Other Ambulatory Visit: Payer: PRIVATE HEALTH INSURANCE | Admitting: Adult Health

## 2017-11-26 ENCOUNTER — Other Ambulatory Visit (HOSPITAL_COMMUNITY)
Admission: RE | Admit: 2017-11-26 | Discharge: 2017-11-26 | Disposition: A | Discharge status: Home / Self Care | Payer: PRIVATE HEALTH INSURANCE | Source: Ambulatory Visit | Attending: Adult Health | Admitting: Adult Health

## 2017-11-26 ENCOUNTER — Ambulatory Visit (INDEPENDENT_AMBULATORY_CARE_PROVIDER_SITE_OTHER): Payer: PRIVATE HEALTH INSURANCE | Admitting: Adult Health

## 2017-11-26 ENCOUNTER — Encounter: Payer: Self-pay | Admitting: Adult Health

## 2017-11-26 VITALS — BP 148/90 | HR 94 | Ht 65.0 in | Wt 244.0 lb

## 2017-11-26 DIAGNOSIS — Z8619 Personal history of other infectious and parasitic diseases: Secondary | ICD-10-CM

## 2017-11-26 DIAGNOSIS — N941 Unspecified dyspareunia: Secondary | ICD-10-CM

## 2017-11-26 DIAGNOSIS — Z01419 Encounter for gynecological examination (general) (routine) without abnormal findings: Secondary | ICD-10-CM

## 2017-11-26 DIAGNOSIS — Z3041 Encounter for surveillance of contraceptive pills: Secondary | ICD-10-CM

## 2017-11-26 DIAGNOSIS — R109 Unspecified abdominal pain: Secondary | ICD-10-CM

## 2017-11-26 DIAGNOSIS — Z124 Encounter for screening for malignant neoplasm of cervix: Secondary | ICD-10-CM

## 2017-11-26 DIAGNOSIS — I10 Essential (primary) hypertension: Secondary | ICD-10-CM

## 2017-11-26 HISTORY — DX: Personal history of other infectious and parasitic diseases: Z86.19

## 2017-11-26 NOTE — Progress Notes (Signed)
Patient ID: Katelyn Avila, female   DOB: [birth year], 31 y.o.   MRN: [id number] History of Present Illness: Katelyn Avila is a 31 year old white female, married, in for a well woman gyn exam and pap.She complains of cramping, and some back pain, and some pain with sex at times and has brown mucous with period.She says family members and her mom had to have hysterectomy about this age. PCP is Dr Reuel Boomaniel.  Current Medications, Allergies, Past Medical History, Past Surgical History, Family History and Social History were reviewed in [facility] electronic medical record.     Review of Systems:  Patient denies any headaches, hearing loss, fatigue, blurred vision, shortness of breath, chest pain, abdominal pain, problems with bowel movements, urination.  No joint pain or mood swings. See HPI for positives.  Physical Exam:BP (!) 148/90 (BP Location: Left Arm, Patient Position: Sitting, Cuff Size: Large)   Pulse 94   Ht 5\' 5"  (1.651 m)   Wt 244 lb (110.7 kg)   Breastfeeding? No   BMI 40.60 kg/m  General:  Well developed, well nourished, no acute distress Skin:  Warm and dry Neck:  Midline trachea, normal thyroid, good ROM, no lymphadenopathy Lungs; Clear to auscultation bilaterally Breast:  No dominant palpable mass, retraction, or nipple discharge Cardiovascular: Regular rate and rhythm Abdomen:  Soft, non tender, no hepatosplenomegaly Pelvic:  External genitalia is normal in appearance, no lesions.  The vagina is normal in appearance. Urethra has no lesions or masses. The cervix is bulbous. Pap with HPV and GC/CHL performed. Uterus is felt to be normal size, shape, and contour,mildly tender.  No adnexal masses or tenderness noted.Bladder is non tender, no masses felt. Extremities/musculoskeletal:  No swelling or varicosities noted, no clubbing or cyanosis Psych:  No mood changes, alert and cooperative,seems happy PHQ 2 score 1.Is on meds and is doing well.  Will get US to assess uterus.    Impression: 1. Encounter for gynecological examination with Papanicolaou smear of cervix   2. Routine cervical smear   3. Chronic hypertension   4. Abdominal cramping   5. Dyspareunia, female   6. Encounter for surveillance of contraceptive pills   7. History of HPV infection       Plan:  Continue lo loestrin for now Return in 1 week for GYN US Physical in 1 year,pap in 3 if normal Will talk after US, amy need to change OCs.

## 2017-11-27 LAB — CYTOLOGY - PAP
CHLAMYDIA, DNA PROBE: NEGATIVE
DIAGNOSIS: NEGATIVE
HPV (WINDOPATH): NOT DETECTED
Neisseria Gonorrhea: NEGATIVE

## 2017-12-02 ENCOUNTER — Ambulatory Visit (INDEPENDENT_AMBULATORY_CARE_PROVIDER_SITE_OTHER): Payer: PRIVATE HEALTH INSURANCE

## 2017-12-02 DIAGNOSIS — R109 Unspecified abdominal pain: Secondary | ICD-10-CM | POA: Diagnosis not present

## 2017-12-02 DIAGNOSIS — N941 Unspecified dyspareunia: Secondary | ICD-10-CM | POA: Diagnosis not present

## 2017-12-02 NOTE — Progress Notes (Signed)
PELVIC US TA/TV: homogeneous anteverted uterus,wnl,EEC 4.9 mm,normal ovaries bilat, ovaries appear mobile,no free fluid,no pain during ultrasound

## 2017-12-03 ENCOUNTER — Telehealth: Payer: Self-pay | Admitting: Adult Health

## 2017-12-03 NOTE — Telephone Encounter (Signed)
Pt aware US normal  

## 2018-02-22 ENCOUNTER — Other Ambulatory Visit: Payer: Self-pay | Admitting: *Deleted

## 2018-02-22 MED ORDER — NORETHIN-ETH ESTRAD-FE BIPHAS 1 MG-10 MCG / 10 MCG PO TABS: 1.0000 | ORAL_TABLET | Freq: Every day | ORAL | 3 refills | Status: DC

## 2018-09-09 ENCOUNTER — Ambulatory Visit (HOSPITAL_COMMUNITY): Payer: Self-pay | Admitting: Psychiatry

## 2018-10-14 NOTE — Progress Notes (Deleted)
Psychiatric Initial Adult Assessment   Patient Identification: Katelyn Avila MRN:  [id number] Date of Evaluation:  10/14/2018 Referral Source: *** Chief Complaint:   Visit Diagnosis: No diagnosis found.  History of Present Illness:   Katelyn Avila is a 32 y.o. year old female with a history of hypertension, who is referred for     Associated Signs/Symptoms: Depression Symptoms:  {DEPRESSION SYMPTOMS:20000} (Hypo) Manic Symptoms:  {BHH MANIC SYMPTOMS:22872} Anxiety Symptoms:  {BHH ANXIETY SYMPTOMS:22873} Psychotic Symptoms:  {BHH PSYCHOTIC SYMPTOMS:22874} PTSD Symptoms: {BHH PTSD SYMPTOMS:22875}  Past Psychiatric History:  Outpatient:  Psychiatry admission:  Previous suicide attempt:  Past trials of medication:  History of violence:   Previous Psychotropic Medications: {YES/NO:21197}  Substance Abuse History in the last 12 months:  {yes no:314532}  Consequences of Substance Abuse: {BHH CONSEQUENCES OF SUBSTANCE ABUSE:22880}  Past Medical History:  Past Medical History:  Diagnosis Date  . 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 ADD (attention deficit disorder)   . Anemia   . Carpal tunnel syndrome, bilateral   . Cervical high risk HPV (human papillomavirus) test positive 07/25/2015  . Complication of anesthesia    states woke up during T & A  . Gestational diabetes   . Hidradenitis 07/23/2015  . Hypertension    under control "most of the time"; has been on med. since 09/2014  . Neuromuscular disorder (HCC)   . Pain with urination 07/23/2015  . PCOS (polycystic ovarian syndrome)   . Seasonal allergies     Past Surgical History:  Procedure Laterality Date  . CARPAL TUNNEL RELEASE Right 02/22/2015   Procedure: RIGHT CARPAL TUNNEL RELEASE;  Surgeon: Betha Loa, MD;  [facility];  Service: Orthopedics;  Laterality: Right;  . CESAREAN SECTION    . CESAREAN SECTION N/A 03/12/2017   Procedure: CESAREAN SECTION;  Surgeon: Catalina Antigua, MD;  [facility];  Service: Obstetrics;  Laterality: N/A;  . TONSILLECTOMY AND ADENOIDECTOMY    . WISDOM TOOTH EXTRACTION      Family Psychiatric History: ***  Family History:  Family History  Problem Relation Age of Onset  . Fibroids Mother   . Hypertension Mother   . Endometriosis Mother   . Hypertension Father   . Hypertension Brother   . Hypertension Maternal Grandmother   . Diabetes Maternal Grandmother   . COPD Maternal Grandmother   . Hypertension Maternal Grandfather   . Hypertension Paternal Grandmother   . Cancer Paternal Grandmother        breast  . Hypertension Paternal Grandfather     Social History:   Social History   Socioeconomic History  . Marital status: Married    Spouse name: Not on file  . Number of children: Not on file  . Years of education: Not on file  . Highest education level: Not on file  Occupational History  . Not on file  Social Needs  . Financial resource strain: Not on file  . Food insecurity:    Worry: Not on file    Inability: Not on file  . Transportation needs:    Medical: Not on file    Non-medical: Not on file  Tobacco Use  . Smoking status: Former Smoker    Packs/day: 0.50    Years: 13.00    Pack years: 6.50    Types: Cigarettes    Last attempt to quit: 07/11/2016    Years since quitting: 2.2  . Smokeless tobacco: Never Used  Substance and Sexual Activity  . Alcohol use:  No    Comment: 1-2 x/week and weekends-before pregnancy  . Drug use: No  . Sexual activity: Yes    Birth control/protection: Pill  Lifestyle  . Physical activity:    Days per week: Not on file    Minutes per session: Not on file  . Stress: Not on file  Relationships  . Social connections:    Talks on phone: Not on file    Gets together: Not on file    Attends religious service: Not on file    Active member of club or organization: Not on file    Attends meetings of clubs or organizations: Not on file    Relationship status: Not on file  Other Topics  Concern  . Not on file  Social History Narrative  . Not on file    Additional Social History: ***  Allergies:  No Known Allergies  Metabolic Disorder Labs: Lab Results  Component Value Date   HGBA1C 6.2 (H) 08/12/2016   No results found for: PROLACTIN No results found for: CHOL, TRIG, HDL, CHOLHDL, VLDL, LDLCALC   Current Medications: Current Outpatient Medications  Medication Sig Dispense Refill  . amphetamine-dextroamphetamine (ADDERALL) 10 MG tablet Take 10 mg by mouth daily with breakfast.    . buPROPion (WELLBUTRIN SR) 150 MG 12 hr tablet Take 150 mg by mouth 2 (two) times daily.    . busPIRone (BUSPAR) 10 MG tablet Take 10 mg by mouth 3 (three) times daily.    Marland Kitchen lisinopril (PRINIVIL,ZESTRIL) 20 MG tablet Take 20 mg by mouth daily.    . Norethindrone-Ethinyl Estradiol-Fe Biphas (LO LOESTRIN FE) 1 MG-10 MCG / 10 MCG tablet Take 1 tablet by mouth daily. 3 Package 3   No current facility-administered medications for this visit.     Neurologic: Headache: No Seizure: No Paresthesias:No  Musculoskeletal: Strength & Muscle Tone: within normal limits Gait & Station: normal Patient leans: N/A  Psychiatric Specialty Exam: ROS  not currently breastfeeding.There is no height or weight on file to calculate BMI.  General Appearance: Fairly Groomed  Eye Contact:  Good  Speech:  Clear and Coherent  Volume:  Normal  Mood:  {BHH MOOD:22306}  Affect:  {Affect (PAA):22687}  Thought Process:  Coherent  Orientation:  Full (Time, Place, and Person)  Thought Content:  Logical  Suicidal Thoughts:  {ST/HT (PAA):22692}  Homicidal Thoughts:  {ST/HT (PAA):22692}  Memory:  Immediate;   Good  Judgement:  {Judgement (PAA):22694}  Insight:  {Insight (PAA):22695}  Psychomotor Activity:  Normal  Concentration:  Concentration: Good and Attention Span: Good  Recall:  Good  Fund of Knowledge:Good  Language: Good  Akathisia:  No  Handed:  Right  AIMS (if indicated):  N/A  Assets:   Communication Skills Desire for Improvement  ADL's:  Intact  Cognition: WNL  Sleep:  ***   Assessment  Plan  The patient demonstrates the following risk factors for suicide: Chronic risk factors for suicide include: {Chronic Risk Factors for WUJWJXB:14782956}. Acute risk factors for suicide include: {Acute Risk Factors for OZHYQMV:78469629}. Protective factors for this patient include: {Protective Factors for Suicide BMWU:13244010}. Considering these factors, the overall suicide risk at this point appears to be {Desc; low/moderate/high:110033}. Patient {ACTION; IS/IS UVO:53664403} appropriate for outpatient follow up.   Treatment Plan Summary: Plan as above   Neysa Hotter, MD 10/17/20191:53 PM

## 2018-10-18 ENCOUNTER — Ambulatory Visit (HOSPITAL_COMMUNITY): Payer: Self-pay | Admitting: Psychiatry

## 2018-10-20 NOTE — Progress Notes (Addendum)
Psychiatric Initial Adult Assessment   Patient Identification: Katelyn Avila MRN:  [id number] Date of Evaluation:  10/21/2018 Referral Source: Richardean Chimera, MD Chief Complaint:   Chief Complaint    Depression; Psychiatric Evaluation    I feel I am in prison" Visit Diagnosis:    ICD-10-CM   1. Moderate episode of recurrent major depressive disorder (HCC) F33.1     History of Present Illness:   Katelyn Avila is a 32 y.o. year old female with a history of hypertension, diabetes, who was referred for anxiety.   She states that she has been having worsening depression since the birth of her son, who is 25 months old.  Although she has been started on lithium, she does not think it helps and she would like to discontinue it.  She states that she tends to stay in the house most of the time due to significant fatigue. She becomes tearful, stating that she is isolated and was very distant from coworkers when she used to work. She feels that she is in a prison.  Although she has had depression on and off, she believe it is worse. She reports good relationship with her husband at home. She does not think her depression is due to her son, but "my fault."    She sleeps 5-6 hours, non refreshing sleep. She has had significant anhedonia , and difficulty in concentration. She feels anxious, tense, irritable and has panic attacks. She denies HI. She denies any symptoms of mania, including euphoria or decreased need for sleep. She denies any SI. She had more crying spells when she tapered down bupropion. She denies alcohol use, drug use.   Associated Signs/Symptoms: Depression Symptoms:  depressed mood, anhedonia, insomnia, fatigue, anxiety, panic attacks, (Hypo) Manic Symptoms:  Denies decreased need for sleep, euphoria Anxiety Symptoms:  Excessive Worry, Panic Symptoms, Psychotic Symptoms:  denies AH, VH, paranoia PTSD Symptoms: Negative  Past Psychiatric History:  Outpatient: denies (was  on Ritalin when she was at elementary school) Psychiatry admission: denies  Previous suicide attempt: denies Past trials of medication: Wellbutrin, lithium, buspar, Ritalin History of violence: denies  Previous Psychotropic Medications: Yes   Substance Abuse History in the last 12 months:  No.  Consequences of Substance Abuse: NA  Past Medical History:  Past Medical History:  Diagnosis Date  . 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 ADD (attention deficit disorder)   . Anemia   . Carpal tunnel syndrome, bilateral   . Cervical high risk HPV (human papillomavirus) test positive 07/25/2015  . Complication of anesthesia    states woke up during T & A  . Gestational diabetes   . Hidradenitis 07/23/2015  . Hypertension    under control "most of the time"; has been on med. since 09/2014  . Neuromuscular disorder (HCC)   . Pain with urination 07/23/2015  . PCOS (polycystic ovarian syndrome)   . Seasonal allergies     Past Surgical History:  Procedure Laterality Date  . CARPAL TUNNEL RELEASE Right 02/22/2015   Procedure: RIGHT CARPAL TUNNEL RELEASE;  Surgeon: Betha Loa, MD;  Location: [facility];  Service: Orthopedics;  Laterality: Right;  . CESAREAN SECTION    . CESAREAN SECTION N/A 03/12/2017   Procedure: CESAREAN SECTION;  Surgeon: Catalina Antigua, MD;  [facility]: [facility];  Service: Obstetrics;  Laterality: N/A;  . TONSILLECTOMY AND ADENOIDECTOMY    . WISDOM TOOTH EXTRACTION      Family Psychiatric History:  Mother- alcohol use, drug use, father-  alcohol use, brother- drug use  Family History:  Family History  Problem Relation Age of Onset  . Fibroids Mother   . Hypertension Mother   . Endometriosis Mother   . Alcohol abuse Mother   . Drug abuse Mother   . Hypertension Father   . Alcohol abuse Father   . Hypertension Brother   . Drug abuse Brother   . Hypertension Maternal Grandmother   . Diabetes Maternal Grandmother   . COPD Maternal Grandmother   .  Hypertension Maternal Grandfather   . Hypertension Paternal Grandmother   . Cancer Paternal Grandmother        breast  . Hypertension Paternal Grandfather     Social History:   Social History   Socioeconomic History  . Marital status: Married    Spouse name: Not on file  . Number of children: Not on file  . Years of education: Not on file  . Highest education level: Not on file  Occupational History  . Not on file  Social Needs  . Financial resource strain: Not on file  . Food insecurity:    Worry: Not on file    Inability: Not on file  . Transportation needs:    Medical: Not on file    Non-medical: Not on file  Tobacco Use  . Smoking status: Former Smoker    Packs/day: 0.50    Years: 13.00    Pack years: 6.50    Types: Cigarettes    Last attempt to quit: 07/11/2016    Years since quitting: 2.2  . Smokeless tobacco: Never Used  Substance and Sexual Activity  . Alcohol use: No    Comment: 1-2 x/week and weekends-before pregnancy  . Drug use: No  . Sexual activity: Yes    Birth control/protection: Pill  Lifestyle  . Physical activity:    Days per week: Not on file    Minutes per session: Not on file  . Stress: Not on file  Relationships  . Social connections:    Talks on phone: Not on file    Gets together: Not on file    Attends religious service: Not on file    Active member of club or organization: Not on file    Attends meetings of clubs or organizations: Not on file    Relationship status: Not on file  Other Topics Concern  . Not on file  Social History Narrative  . Not on file    Additional Social History:  She lives with her husband, 69 months old son.  She grew up in [place]. Her parents divorced when she was 78 year old. She was raised by her mother, who abused alcohol and opioids after MVA. She quit high school to take care of her younger siblings. Her older brother, younger sister abused alcohol, and currently live with her mother, who stays at  her grandmother's. She reports good support from her grandmother.  Education: quit high school to take care of her younger siblings, GED in 2016 Work: [employer] for about an year, quit around her pregnancy  Allergies:  No Known Allergies  Metabolic Disorder Labs: Lab Results  Component Value Date   HGBA1C 6.2 (H) 08/12/2016   No results found for: PROLACTIN No results found for: CHOL, TRIG, HDL, CHOLHDL, VLDL, LDLCALC   Current Medications: Current Outpatient Medications  Medication Sig Dispense Refill  . buPROPion (WELLBUTRIN SR) 150 MG 12 hr tablet Take 150 mg by mouth 2 (two) times daily.    . busPIRone (  BUSPAR) 10 MG tablet Take 10 mg by mouth 3 (three) times daily.    Marland Kitchen lisinopril (PRINIVIL,ZESTRIL) 20 MG tablet Take 20 mg by mouth daily.    . Norethindrone-Ethinyl Estradiol-Fe Biphas (LO LOESTRIN FE) 1 MG-10 MCG / 10 MCG tablet Take 1 tablet by mouth daily. 3 Package 3  . sertraline (ZOLOFT) 50 MG tablet 25 mg daily for one week, then 50 mg daily 30 tablet 0   No current facility-administered medications for this visit.     Neurologic: Headache: No Seizure: No Paresthesias:No  Musculoskeletal: Strength & Muscle Tone: within normal limits Gait & Station: normal Patient leans: N/A  Psychiatric Specialty Exam: Review of Systems  Psychiatric/Behavioral: Positive for depression. Negative for hallucinations, memory loss, substance abuse and suicidal ideas. The patient is nervous/anxious and has insomnia.   All other systems reviewed and are negative.   Blood pressure 125/84, pulse 83, height 5\' 5"  (1.651 m), weight 257 lb (116.6 kg), SpO2 100 %, not currently breastfeeding.Body mass index is 42.77 kg/m.  General Appearance: Fairly Groomed  Eye Contact:  Good  Speech:  Clear and Coherent  Volume:  Normal  Mood:  Depressed  Affect:  Appropriate, Congruent, Restricted, Tearful and down  Thought Process:  Coherent  Orientation:  Full (Time, Place, and Person)  Thought  Content:  Logical  Suicidal Thoughts:  No  Homicidal Thoughts:  No  Memory:  Immediate;   Good  Judgement:  Good  Insight:  Good  Psychomotor Activity:  Normal  Concentration:  Concentration: Good and Attention Span: Good  Recall:  Good  Fund of Knowledge:Good  Language: Good  Akathisia:  No  Handed:  Right  AIMS (if indicated):  N/A  Assets:  Communication Skills Desire for Improvement  ADL's:  Intact  Cognition: WNL  Sleep:  poor   Assessment Katelyn Avila is a 32 y.o. year old female with a history of hypertension, diabetes, who was referred for anxiety.   # MDD, moderate, recurrent without psychotic features Patient reports worsening depressive symptoms after birth of her son.  Will start sertraline to target depression and anxiety.  Discussed risk of GI side effect and drowsiness.  Will continue Wellbutrin at this time as adjunctive treatment for depression.  Will continue BuSpar for anxiety.  Discussed behavioral activation.  Discussed option of IOP if she is interested.   Plan 1. Discontinue lithium 2. Start sertraline 25 mg daily for one week, then 50 mg daily  3. Continue Wellbutrin 150 mg twice a day  4. Continue buspar 10 mg three times a day  5. Return to clinic in one month for 30 mins  -TSH wnl in 06/2018  The patient demonstrates the following risk factors for suicide: Chronic risk factors for suicide include: psychiatric disorder of depression. Acute risk factors for suicide include: unemployment. Protective factors for this patient include: positive social support, responsibility to others (children, family), coping skills and hope for the future. Considering these factors, the overall suicide risk at this point appears to be low. Patient is appropriate for outpatient follow up.   Treatment Plan Summary: Plan as above   Neysa Hotter, MD 10/24/201911:12 AM

## 2018-10-21 ENCOUNTER — Encounter (HOSPITAL_COMMUNITY): Payer: Self-pay | Admitting: Psychiatry

## 2018-10-21 ENCOUNTER — Encounter

## 2018-10-21 ENCOUNTER — Ambulatory Visit (INDEPENDENT_AMBULATORY_CARE_PROVIDER_SITE_OTHER): Payer: PRIVATE HEALTH INSURANCE | Admitting: Psychiatry

## 2018-10-21 VITALS — BP 125/84 | HR 83 | Ht 65.0 in | Wt 257.0 lb

## 2018-10-21 DIAGNOSIS — F331 Major depressive disorder, recurrent, moderate: Secondary | ICD-10-CM

## 2018-10-21 DIAGNOSIS — G4700 Insomnia, unspecified: Secondary | ICD-10-CM | POA: Diagnosis not present

## 2018-10-21 DIAGNOSIS — F419 Anxiety disorder, unspecified: Secondary | ICD-10-CM | POA: Diagnosis not present

## 2018-10-21 MED ORDER — SERTRALINE HCL 50 MG PO TABS: ORAL_TABLET | ORAL | 0 refills | Status: DC

## 2018-10-21 NOTE — Patient Instructions (Signed)
1. Discontinue lithium 2. Start sertraline 25 mg daily for one week, then 50 mg daily  3. Continue wellbutrin 150 mg twice a day  4. Continue buspar 10 mg three times a day  5. Return to clinic in one month for 30 mins

## 2018-11-18 NOTE — Progress Notes (Deleted)
BH MD/PA/NP OP Progress Note  11/18/2018 12:42 PM Katelyn Avila  MRN:  [id number]  Chief Complaint:  HPI: *** Visit Diagnosis: No diagnosis found.  Past Psychiatric History: Please see initial evaluation for full details. I have reviewed the history. No updates at this time.     Past Medical History:  Past Medical History:  Diagnosis Date  . 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 ADD (attention deficit disorder)   . Anemia   . Carpal tunnel syndrome, bilateral   . Cervical high risk HPV (human papillomavirus) test positive 07/25/2015  . Complication of anesthesia    states woke up during T & A  . Gestational diabetes   . Hidradenitis 07/23/2015  . Hypertension    under control "most of the time"; has been on med. since 09/2014  . Neuromuscular disorder (HCC)   . Pain with urination 07/23/2015  . PCOS (polycystic ovarian syndrome)   . Seasonal allergies     Past Surgical History:  Procedure Laterality Date  . CARPAL TUNNEL RELEASE Right 02/22/2015   Procedure: RIGHT CARPAL TUNNEL RELEASE;  Surgeon: Betha Loa, MD;  Location: [facility];  Service: Orthopedics;  Laterality: Right;  . CESAREAN SECTION    . CESAREAN SECTION N/A 03/12/2017   Procedure: CESAREAN SECTION;  Surgeon: Catalina Antigua, MD;  Location: [facility];  Service: Obstetrics;  Laterality: N/A;  . TONSILLECTOMY AND ADENOIDECTOMY    . WISDOM TOOTH EXTRACTION      Family Psychiatric History: Please see initial evaluation for full details. I have reviewed the history. No updates at this time.     Family History:  Family History  Problem Relation Age of Onset  . Fibroids Mother   . Hypertension Mother   . Endometriosis Mother   . Alcohol abuse Mother   . Drug abuse Mother   . Hypertension Father   . Alcohol abuse Father   . Hypertension Brother   . Drug abuse Brother   . Hypertension Maternal Grandmother   . Diabetes Maternal Grandmother   . COPD Maternal Grandmother   . Hypertension Maternal  Grandfather   . Hypertension Paternal Grandmother   . Cancer Paternal Grandmother        breast  . Hypertension Paternal Grandfather     Social History:  Social History   Socioeconomic History  . Marital status: Married    Spouse name: Not on file  . Number of children: Not on file  . Years of education: Not on file  . Highest education level: Not on file  Occupational History  . Not on file  Social Needs  . Financial resource strain: Not on file  . Food insecurity:    Worry: Not on file    Inability: Not on file  . Transportation needs:    Medical: Not on file    Non-medical: Not on file  Tobacco Use  . Smoking status: Former Smoker    Packs/day: 0.50    Years: 13.00    Pack years: 6.50    Types: Cigarettes    Last attempt to quit: 07/11/2016    Years since quitting: 2.3  . Smokeless tobacco: Never Used  Substance and Sexual Activity  . Alcohol use: No    Comment: 1-2 x/week and weekends-before pregnancy  . Drug use: No  . Sexual activity: Yes    Birth control/protection: Pill  Lifestyle  . Physical activity:    Days per week: Not on file    Minutes per session: Not on  file  . Stress: Not on file  Relationships  . Social connections:    Talks on phone: Not on file    Gets together: Not on file    Attends religious service: Not on file    Active member of club or organization: Not on file    Attends meetings of clubs or organizations: Not on file    Relationship status: Not on file  Other Topics Concern  . Not on file  Social History Narrative  . Not on file    Allergies: No Known Allergies  Metabolic Disorder Labs: Lab Results  Component Value Date   HGBA1C 6.2 (H) 08/12/2016   No results found for: PROLACTIN No results found for: CHOL, TRIG, HDL, CHOLHDL, VLDL, LDLCALC No results found for: TSH  Therapeutic Level Labs: No results found for: LITHIUM No results found for: VALPROATE No components found for:  CBMZ  Current Medications: Current  Outpatient Medications  Medication Sig Dispense Refill  . buPROPion (WELLBUTRIN SR) 150 MG 12 hr tablet Take 150 mg by mouth 2 (two) times daily.    . busPIRone (BUSPAR) 10 MG tablet Take 10 mg by mouth 3 (three) times daily.    Katelyn Avila. lisinopril (PRINIVIL,ZESTRIL) 20 MG tablet Take 20 mg by mouth daily.    . Norethindrone-Ethinyl Estradiol-Fe Biphas (LO LOESTRIN FE) 1 MG-10 MCG / 10 MCG tablet Take 1 tablet by mouth daily. 3 Package 3  . sertraline (ZOLOFT) 50 MG tablet 25 mg daily for one week, then 50 mg daily 30 tablet 0   No current facility-administered medications for this visit.      Musculoskeletal: Strength & Muscle Tone: within normal limits Gait & Station: normal Patient leans: N/A  Psychiatric Specialty Exam: ROS  not currently breastfeeding.There is no height or weight on file to calculate BMI.  General Appearance: Fairly Groomed  Eye Contact:  Good  Speech:  Clear and Coherent  Volume:  Normal  Mood:  {BHH MOOD:22306}  Affect:  {Affect (PAA):22687}  Thought Process:  Coherent  Orientation:  Full (Time, Place, and Person)  Thought Content: Logical   Suicidal Thoughts:  {ST/HT (PAA):22692}  Homicidal Thoughts:  {ST/HT (PAA):22692}  Memory:  Immediate;   Good  Judgement:  {Judgement (PAA):22694}  Insight:  {Insight (PAA):22695}  Psychomotor Activity:  Normal  Concentration:  Concentration: Good and Attention Span: Good  Recall:  Good  Fund of Knowledge: Good  Language: Good  Akathisia:  No  Handed:  Right  AIMS (if indicated): not done  Assets:  Communication Skills Desire for Improvement  ADL's:  Intact  Cognition: WNL  Sleep:  {BHH GOOD/FAIR/POOR:22877}   Screenings: PHQ2-9     [facility]  PHQ-2 Total Score  1  6  PHQ-9 Total Score  -  22       Assessment and Plan:  Katelyn Avila is a 32 y.o. year old female with a history of depression, hypertension, diabetes,  who presents for follow up appointment for No diagnosis found.  # MDD, moderate, recurrent without psychotic features  Patient reports worsening depressive symptoms after birth of her son.  Will start sertraline to target depression and anxiety.  Discussed risk of GI side effect and drowsiness.  Will continue Wellbutrin at this time as adjunctive treatment for depression.  Will continue BuSpar for anxiety.  Discussed behavioral activation.  Discussed option of IOP if she is interested.   Plan 1. Discontinue  lithium 2. Start sertraline 25 mg daily for one week, then 50 mg daily  3. Continue Wellbutrin 150 mg twice a day  4. Continue buspar 10 mg three times a day  5. Return to clinic in one month for 30 mins  -TSH wnl in 06/2018  The patient demonstrates the following risk factors for suicide: Chronic risk factors for suicide include: psychiatric disorder of depression. Acute risk factors for suicide include: unemployment. Protective factors for this patient include: positive social support, responsibility to others (children, family), coping skills and hope for the future. Considering these factors, the overall suicide risk at this point appears to be low. Patient is appropriate for outpatient follow up.   Neysa Hotter, MD 11/18/2018, 12:42 PM

## 2018-11-23 ENCOUNTER — Other Ambulatory Visit (HOSPITAL_COMMUNITY): Payer: Self-pay | Admitting: Psychiatry

## 2018-11-23 ENCOUNTER — Telehealth (HOSPITAL_COMMUNITY): Payer: Self-pay | Admitting: *Deleted

## 2018-11-23 ENCOUNTER — Ambulatory Visit (HOSPITAL_COMMUNITY): Payer: PRIVATE HEALTH INSURANCE | Admitting: Psychiatry

## 2018-11-23 MED ORDER — BUSPIRONE HCL 10 MG PO TABS: 10.0000 mg | ORAL_TABLET | Freq: Three times a day (TID) | ORAL | 0 refills | Status: DC

## 2018-11-23 MED ORDER — SERTRALINE HCL 50 MG PO TABS: 50.0000 mg | ORAL_TABLET | Freq: Every day | ORAL | 0 refills | Status: DC

## 2018-11-23 MED ORDER — BUPROPION HCL ER (SR) 150 MG PO TB12: 150.0000 mg | ORAL_TABLET | Freq: Two times a day (BID) | ORAL | 0 refills | Status: DC

## 2018-11-23 NOTE — Telephone Encounter (Signed)
LVM : ordered sertraline. If she needs buspar and wellbutrin refill, please let me know (she declined those the last time as she had enough from her provider)

## 2018-11-23 NOTE — Telephone Encounter (Signed)
ordered

## 2018-11-23 NOTE — Telephone Encounter (Signed)
I ordered sertraline. If she needs buspar and wellbutrin refill, please let me know (she declined those the last time as she had enough from her provider)

## 2018-11-23 NOTE — Telephone Encounter (Signed)
Dr Vanetta ShawlHisada Patient called in to cancel today's appointment due to her entire family his home with the flu & everyone is on Tamiflu. Patient will call back to reschedule when feeling better. Requested refill on her med's.

## 2018-11-23 NOTE — Telephone Encounter (Signed)
Dr Vanetta ShawlHisada Patient has decided to contine buspar and wellbutrin & requested refills

## 2018-12-27 ENCOUNTER — Other Ambulatory Visit (HOSPITAL_COMMUNITY): Payer: Self-pay | Admitting: Psychiatry

## 2018-12-27 ENCOUNTER — Telehealth (HOSPITAL_COMMUNITY): Payer: Self-pay | Admitting: *Deleted

## 2018-12-27 ENCOUNTER — Telehealth (HOSPITAL_COMMUNITY): Payer: Self-pay | Admitting: Psychiatry

## 2018-12-27 MED ORDER — BUSPIRONE HCL 10 MG PO TABS: 10.0000 mg | ORAL_TABLET | Freq: Three times a day (TID) | ORAL | 1 refills | Status: DC

## 2018-12-27 MED ORDER — BUPROPION HCL ER (SR) 150 MG PO TB12: 150.0000 mg | ORAL_TABLET | Freq: Two times a day (BID) | ORAL | 1 refills | Status: DC

## 2018-12-27 MED ORDER — SERTRALINE HCL 50 MG PO TABS
50.0000 mg | ORAL_TABLET | Freq: Every day | ORAL | 1 refills | Status: DC
Start: 2018-12-27 — End: 2019-02-24

## 2018-12-27 NOTE — Telephone Encounter (Signed)
ordered

## 2018-12-27 NOTE — Telephone Encounter (Signed)
Dr Vanetta ShawlHisada  Patient left message with front office for refill on her med's next appointment is  01/31/2019

## 2018-12-27 NOTE — Telephone Encounter (Signed)
LVM PER PROVIDER : Ordered refill for sertraline per request. Contacted the patient to make follow up appointment in January

## 2018-12-27 NOTE — Telephone Encounter (Signed)
Ordered refill for sertraline per request. Please contact the patient to make follow up appointment in January.

## 2019-01-25 NOTE — Progress Notes (Deleted)
BH MD/PA/NP OP Progress Note  01/25/2019 3:22 PM Katelyn Avila  MRN:  [id number]  Chief Complaint:  HPI: *** Visit Diagnosis: No diagnosis found.  Past Psychiatric History: Please see initial evaluation for full details. I have reviewed the history. No updates at this time.     Past Medical History:  Past Medical History:  Diagnosis Date  . 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 ADD (attention deficit disorder)   . Anemia   . Carpal tunnel syndrome, bilateral   . Cervical high risk HPV (human papillomavirus) test positive 07/25/2015  . Complication of anesthesia    states woke up during T & A  . Gestational diabetes   . Hidradenitis 07/23/2015  . Hypertension    under control "most of the time"; has been on med. since 09/2014  . Neuromuscular disorder (HCC)   . Pain with urination 07/23/2015  . PCOS (polycystic ovarian syndrome)   . Seasonal allergies     Past Surgical History:  Procedure Laterality Date  . CARPAL TUNNEL RELEASE Right 02/22/2015   Procedure: RIGHT CARPAL TUNNEL RELEASE;  Surgeon: Betha LoaKevin Kuzma, MD;  Location: [facility];  Service: Orthopedics;  Laterality: Right;  . CESAREAN SECTION    . CESAREAN SECTION N/A 03/12/2017   Procedure: CESAREAN SECTION;  Surgeon: Catalina AntiguaPeggy Constant, MD;  [facility];  Service: Obstetrics;  Laterality: N/A;  . TONSILLECTOMY AND ADENOIDECTOMY    . WISDOM TOOTH EXTRACTION      Family Psychiatric History: Please see initial evaluation for full details. I have reviewed the history. No updates at this time.     Family History:  Family History  Problem Relation Age of Onset  . Fibroids Mother   . Hypertension Mother   . Endometriosis Mother   . Alcohol abuse Mother   . Drug abuse Mother   . Hypertension Father   . Alcohol abuse Father   . Hypertension Brother   . Drug abuse Brother   . Hypertension Maternal Grandmother   . Diabetes Maternal Grandmother   . COPD Maternal Grandmother   . Hypertension Maternal  Grandfather   . Hypertension Paternal Grandmother   . Cancer Paternal Grandmother        breast  . Hypertension Paternal Grandfather     Social History:  Social History   Socioeconomic History  . Marital status: Married    Spouse name: Not on file  . Number of children: Not on file  . Years of education: Not on file  . Highest education level: Not on file  Occupational History  . Not on file  Social Needs  . Financial resource strain: Not on file  . Food insecurity:    Worry: Not on file    Inability: Not on file  . Transportation needs:    Medical: Not on file    Non-medical: Not on file  Tobacco Use  . Smoking status: Former Smoker    Packs/day: 0.50    Years: 13.00    Pack years: 6.50    Types: Cigarettes    Last attempt to quit: 07/11/2016    Years since quitting: 2.5  . Smokeless tobacco: Never Used  Substance and Sexual Activity  . Alcohol use: No    Comment: 1-2 x/week and weekends-before pregnancy  . Drug use: No  . Sexual activity: Yes    Birth control/protection: Pill  Lifestyle  . Physical activity:    Days per week: Not on file    Minutes per session: Not on  file  . Stress: Not on file  Relationships  . Social connections:    Talks on phone: Not on file    Gets together: Not on file    Attends religious service: Not on file    Active member of club or organization: Not on file    Attends meetings of clubs or organizations: Not on file    Relationship status: Not on file  Other Topics Concern  . Not on file  Social History Narrative  . Not on file    Allergies: No Known Allergies  Metabolic Disorder Labs: Lab Results  Component Value Date   HGBA1C 6.2 (H) 08/12/2016   No results found for: PROLACTIN No results found for: CHOL, TRIG, HDL, CHOLHDL, VLDL, LDLCALC No results found for: TSH  Therapeutic Level Labs: No results found for: LITHIUM No results found for: VALPROATE No components found for:  CBMZ  Current Medications: Current  Outpatient Medications  Medication Sig Dispense Refill  . buPROPion (WELLBUTRIN SR) 150 MG 12 hr tablet Take 1 tablet (150 mg total) by mouth 2 (two) times daily. 60 tablet 1  . busPIRone (BUSPAR) 10 MG tablet Take 1 tablet (10 mg total) by mouth 3 (three) times daily. 90 tablet 1  . lisinopril (PRINIVIL,ZESTRIL) 20 MG tablet Take 20 mg by mouth daily.    . Norethindrone-Ethinyl Estradiol-Fe Biphas (LO LOESTRIN FE) 1 MG-10 MCG / 10 MCG tablet Take 1 tablet by mouth daily. 3 Package 3  . sertraline (ZOLOFT) 50 MG tablet Take 1 tablet (50 mg total) by mouth daily. 30 tablet 1   No current facility-administered medications for this visit.      Musculoskeletal: Strength & Muscle Tone: within normal limits Gait & Station: normal Patient leans: N/A  Psychiatric Specialty Exam: ROS  not currently breastfeeding.There is no height or weight on file to calculate BMI.  General Appearance: Fairly Groomed  Eye Contact:  Good  Speech:  Clear and Coherent  Volume:  Normal  Mood:  {BHH MOOD:22306}  Affect:  {Affect (PAA):22687}  Thought Process:  Coherent  Orientation:  Full (Time, Place, and Person)  Thought Content: Logical   Suicidal Thoughts:  {ST/HT (PAA):22692}  Homicidal Thoughts:  {ST/HT (PAA):22692}  Memory:  Immediate;   Good  Judgement:  {Judgement (PAA):22694}  Insight:  {Insight (PAA):22695}  Psychomotor Activity:  Normal  Concentration:  Concentration: Good and Attention Span: Good  Recall:  Good  Fund of Knowledge: Good  Language: Good  Akathisia:  No  Handed:  Right  AIMS (if indicated): not done  Assets:  Communication Skills Desire for Improvement  ADL's:  Intact  Cognition: WNL  Sleep:  {BHH GOOD/FAIR/POOR:22877}   Screenings: PHQ2-9     [facility]  PHQ-2 Total Score  1  6  PHQ-9 Total Score  -  22       Assessment and Plan:  Katelyn Avila is a 33 y.o. year old  female with a history of depression, hypertension, diabetes, who presents for follow up appointment for No diagnosis found.  # MDD, moderate, recurrent without psychotic features  Patient reports worsening depressive symptoms after birth of her son.  Will start sertraline to target depression and anxiety.  Discussed risk of GI side effect and drowsiness.  Will continue Wellbutrin at this time as adjunctive treatment for depression.  Will continue BuSpar for anxiety.  Discussed behavioral activation.  Discussed option of IOP if she  is interested.   Plan 1. Discontinue lithium 2. Start sertraline 25 mg daily for one week, then 50 mg daily  3. Continue Wellbutrin 150 mg twice a day  4. Continue buspar 10 mg three times a day  5. Return to clinic in one month for 30 mins  -TSH wnl in 06/2018  The patient demonstrates the following risk factors for suicide: Chronic risk factors for suicide include: psychiatric disorder of depression. Acute risk factors for suicide include: unemployment. Protective factors for this patient include: positive social support, responsibility to others (children, family), coping skills and hope for the future. Considering these factors, the overall suicide risk at this point appears to be low. Patient is appropriate for outpatient follow up.   Neysa Hotter, MD 01/25/2019, 3:22 PM

## 2019-01-31 ENCOUNTER — Ambulatory Visit (HOSPITAL_COMMUNITY): Payer: PRIVATE HEALTH INSURANCE | Admitting: Psychiatry

## 2019-01-31 ENCOUNTER — Telehealth (HOSPITAL_COMMUNITY): Payer: Self-pay | Admitting: *Deleted

## 2019-01-31 NOTE — Telephone Encounter (Signed)
Dr Vanetta Shawl Patient called requesting refill on Zoloft . Had to reschedule today's appointment due to sick child. Next visit 02/07/2019

## 2019-01-31 NOTE — Telephone Encounter (Signed)
She should have one more refill at the pharmacy.

## 2019-01-31 NOTE — Telephone Encounter (Signed)
1 Refill was on hold Rx will fill order

## 2019-02-02 NOTE — Progress Notes (Deleted)
BH MD/PA/NP OP Progress Note  02/02/2019 12:19 PM Katelyn Avila  MRN:  [id number]  Chief Complaint:  HPI: *** Visit Diagnosis: No diagnosis found.  Past Psychiatric History: Please see initial evaluation for full details. I have reviewed the history. No updates at this time.     Past Medical History:  Past Medical History:  Diagnosis Date  . 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 ADD (attention deficit disorder)   . Anemia   . Carpal tunnel syndrome, bilateral   . Cervical high risk HPV (human papillomavirus) test positive 07/25/2015  . Complication of anesthesia    states woke up during T & A  . Gestational diabetes   . Hidradenitis 07/23/2015  . Hypertension    under control "most of the time"; has been on med. since 09/2014  . Neuromuscular disorder (HCC)   . Pain with urination 07/23/2015  . PCOS (polycystic ovarian syndrome)   . Seasonal allergies     Past Surgical History:  Procedure Laterality Date  . CARPAL TUNNEL RELEASE Right 02/22/2015   Procedure: RIGHT CARPAL TUNNEL RELEASE;  Surgeon: Betha LoaKevin Kuzma, MD;  Location: [facility];  Service: Orthopedics;  Laterality: Right;  . CESAREAN SECTION    . CESAREAN SECTION N/A 03/12/2017   Procedure: CESAREAN SECTION;  Surgeon: Catalina AntiguaPeggy Constant, MD;  [facility];  Service: Obstetrics;  Laterality: N/A;  . TONSILLECTOMY AND ADENOIDECTOMY    . WISDOM TOOTH EXTRACTION      Family Psychiatric History: Please see initial evaluation for full details. I have reviewed the history. No updates at this time.     Family History:  Family History  Problem Relation Age of Onset  . Fibroids Mother   . Hypertension Mother   . Endometriosis Mother   . Alcohol abuse Mother   . Drug abuse Mother   . Hypertension Father   . Alcohol abuse Father   . Hypertension Brother   . Drug abuse Brother   . Hypertension Maternal Grandmother   . Diabetes Maternal Grandmother   . COPD Maternal Grandmother   . Hypertension Maternal  Grandfather   . Hypertension Paternal Grandmother   . Cancer Paternal Grandmother        breast  . Hypertension Paternal Grandfather     Social History:  Social History   Socioeconomic History  . Marital status: Married    Spouse name: Not on file  . Number of children: Not on file  . Years of education: Not on file  . Highest education level: Not on file  Occupational History  . Not on file  Social Needs  . Financial resource strain: Not on file  . Food insecurity:    Worry: Not on file    Inability: Not on file  . Transportation needs:    Medical: Not on file    Non-medical: Not on file  Tobacco Use  . Smoking status: Former Smoker    Packs/day: 0.50    Years: 13.00    Pack years: 6.50    Types: Cigarettes    Last attempt to quit: 07/11/2016    Years since quitting: 2.5  . Smokeless tobacco: Never Used  Substance and Sexual Activity  . Alcohol use: No    Comment: 1-2 x/week and weekends-before pregnancy  . Drug use: No  . Sexual activity: Yes    Birth control/protection: Pill  Lifestyle  . Physical activity:    Days per week: Not on file    Minutes per session: Not on  file  . Stress: Not on file  Relationships  . Social connections:    Talks on phone: Not on file    Gets together: Not on file    Attends religious service: Not on file    Active member of club or organization: Not on file    Attends meetings of clubs or organizations: Not on file    Relationship status: Not on file  Other Topics Concern  . Not on file  Social History Narrative  . Not on file    Allergies: No Known Allergies  Metabolic Disorder Labs: Lab Results  Component Value Date   HGBA1C 6.2 (H) 08/12/2016   No results found for: PROLACTIN No results found for: CHOL, TRIG, HDL, CHOLHDL, VLDL, LDLCALC No results found for: TSH  Therapeutic Level Labs: No results found for: LITHIUM No results found for: VALPROATE No components found for:  CBMZ  Current Medications: Current  Outpatient Medications  Medication Sig Dispense Refill  . buPROPion (WELLBUTRIN SR) 150 MG 12 hr tablet Take 1 tablet (150 mg total) by mouth 2 (two) times daily. 60 tablet 1  . busPIRone (BUSPAR) 10 MG tablet Take 1 tablet (10 mg total) by mouth 3 (three) times daily. 90 tablet 1  . lisinopril (PRINIVIL,ZESTRIL) 20 MG tablet Take 20 mg by mouth daily.    . Norethindrone-Ethinyl Estradiol-Fe Biphas (LO LOESTRIN FE) 1 MG-10 MCG / 10 MCG tablet Take 1 tablet by mouth daily. 3 Package 3  . sertraline (ZOLOFT) 50 MG tablet Take 1 tablet (50 mg total) by mouth daily. 30 tablet 1   No current facility-administered medications for this visit.      Musculoskeletal: Strength & Muscle Tone: within normal limits Gait & Station: normal Patient leans: N/A  Psychiatric Specialty Exam: ROS  not currently breastfeeding.There is no height or weight on file to calculate BMI.  General Appearance: Fairly Groomed  Eye Contact:  Good  Speech:  Clear and Coherent  Volume:  Normal  Mood:  {BHH MOOD:22306}  Affect:  {Affect (PAA):22687}  Thought Process:  Coherent  Orientation:  Full (Time, Place, and Person)  Thought Content: Logical   Suicidal Thoughts:  {ST/HT (PAA):22692}  Homicidal Thoughts:  {ST/HT (PAA):22692}  Memory:  Immediate;   Good  Judgement:  {Judgement (PAA):22694}  Insight:  {Insight (PAA):22695}  Psychomotor Activity:  Normal  Concentration:  Concentration: Good and Attention Span: Good  Recall:  Good  Fund of Knowledge: Good  Language: Good  Akathisia:  No  Handed:  Right  AIMS (if indicated): not done  Assets:  Communication Skills Desire for Improvement  ADL's:  Intact  Cognition: WNL  Sleep:  {BHH GOOD/FAIR/POOR:22877}   Screenings: PHQ2-9     [facility]  PHQ-2 Total Score  1  6  PHQ-9 Total Score  -  22       Assessment and Plan:  Katelyn Pattersonshley W Defoor is a 33 y.o. year old  female with a history of hypertension, diabetes, , who presents for follow up appointment for No diagnosis found.  # MDD, moderate, recurrent without psychotic features  Patient reports worsening depressive symptoms after birth of her son.  Will start sertraline to target depression and anxiety.  Discussed risk of GI side effect and drowsiness.  Will continue Wellbutrin at this time as adjunctive treatment for depression.  Will continue BuSpar for anxiety.  Discussed behavioral activation.  Discussed option of IOP if she  is interested.   Plan 1. Discontinue lithium 2. Start sertraline 25 mg daily for one week, then 50 mg daily  3. Continue Wellbutrin 150 mg twice a day  4. Continue buspar 10 mg three times a day  5. Return to clinic in one month for 30 mins  -TSH wnl in 06/2018  The patient demonstrates the following risk factors for suicide: Chronic risk factors for suicide include: psychiatric disorder of depression. Acute risk factors for suicide include: unemployment. Protective factors for this patient include: positive social support, responsibility to others (children, family), coping skills and hope for the future. Considering these factors, the overall suicide risk at this point appears to be low. Patient is appropriate for outpatient follow up.  Neysa Hotter, MD 02/02/2019, 12:19 PM

## 2019-02-07 ENCOUNTER — Ambulatory Visit (HOSPITAL_COMMUNITY): Payer: PRIVATE HEALTH INSURANCE | Admitting: Psychiatry

## 2019-02-17 NOTE — Progress Notes (Signed)
BH MD/PA/NP OP Progress Note  02/24/2019 10:01 AM Katelyn Avila  MRN:  [id number]  Chief Complaint:  Chief Complaint    Follow-up; Depression     HPI:  Patient presents late for follow-up appointment for depression.  She states that there has been no change in her depression.  She continues to feel "just depressed," "Blah."  Although she has been able to take care of her children, she has significant fatigue.  Although she went to a job interview the other day, she could not talk due to anxiety.  She still enjoys doing crafts while drawing, although she does not do those as much as she used to.  She talks about her older son (she has 47 and one year old son), who tends to be argumentative.  She has insomnia.  She has fair appetite.  She has difficulty in concentration and memory loss.  She denies SI.  She has had at least a few panic attacks. [place] with her [place]; she has not had much sunlight as she "should".   Wt Readings from Last 3 Encounters:  02/24/19 259 lb (117.5 kg)  10/21/18 257 lb (116.6 kg)  11/26/17 244 lb (110.7 kg)    Visit Diagnosis:    ICD-10-CM   1. Moderate episode of recurrent major depressive disorder (HCC) F33.1     Past Psychiatric History: Please see initial evaluation for full details. I have reviewed the history. No updates at this time.     Past Medical History:  Past Medical History:  Diagnosis Date  . 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 ADD (attention deficit disorder)   . Anemia   . Carpal tunnel syndrome, bilateral   . Cervical high risk HPV (human papillomavirus) test positive 07/25/2015  . Complication of anesthesia    states woke up during T & A  . Gestational diabetes   . Hidradenitis 07/23/2015  . Hypertension    under control "most of the time"; has been on med. since 09/2014  . Neuromuscular disorder (HCC)   . Pain with urination 07/23/2015  . PCOS (polycystic ovarian syndrome)   . Seasonal allergies     Past Surgical  History:  Procedure Laterality Date  . CARPAL TUNNEL RELEASE Right 02/22/2015   Procedure: RIGHT CARPAL TUNNEL RELEASE;  Surgeon: Betha Loa, MD;  Location: [facility];  Service: Orthopedics;  Laterality: Right;  . CESAREAN SECTION    . CESAREAN SECTION N/A 03/12/2017   Procedure: CESAREAN SECTION;  Surgeon: Catalina Antigua, MD;  [facility];  Service: Obstetrics;  Laterality: N/A;  . TONSILLECTOMY AND ADENOIDECTOMY    . WISDOM TOOTH EXTRACTION      Family Psychiatric History: Please see initial evaluation for full details. I have reviewed the history. No updates at this time.     Family History:  Family History  Problem Relation Age of Onset  . Fibroids Mother   . Hypertension Mother   . Endometriosis Mother   . Alcohol abuse Mother   . Drug abuse Mother   . Hypertension Father   . Alcohol abuse Father   . Hypertension Brother   . Drug abuse Brother   . Hypertension Maternal Grandmother   . Diabetes Maternal Grandmother   . COPD Maternal Grandmother   . Hypertension Maternal Grandfather   . Hypertension Paternal Grandmother   . Cancer Paternal Grandmother        breast  . Hypertension Paternal Grandfather  Social History:  Social History   Socioeconomic History  . Marital status: Married    Spouse name: Not on file  . Number of children: Not on file  . Years of education: Not on file  . Highest education level: Not on file  Occupational History  . Not on file  Social Needs  . Financial resource strain: Not on file  . Food insecurity:    Worry: Not on file    Inability: Not on file  . Transportation needs:    Medical: Not on file    Non-medical: Not on file  Tobacco Use  . Smoking status: Former Smoker    Packs/day: 0.50    Years: 13.00    Pack years: 6.50    Types: Cigarettes    Last attempt to quit: 07/11/2016    Years since quitting: 2.6  . Smokeless tobacco: Never Used  Substance and Sexual Activity  . Alcohol use:  No    Comment: 1-2 x/week and weekends-before pregnancy  . Drug use: No  . Sexual activity: Yes    Birth control/protection: Pill  Lifestyle  . Physical activity:    Days per week: Not on file    Minutes per session: Not on file  . Stress: Not on file  Relationships  . Social connections:    Talks on phone: Not on file    Gets together: Not on file    Attends religious service: Not on file    Active member of club or organization: Not on file    Attends meetings of clubs or organizations: Not on file    Relationship status: Not on file  Other Topics Concern  . Not on file  Social History Narrative  . Not on file    Allergies: No Known Allergies  Metabolic Disorder Labs: Lab Results  Component Value Date   HGBA1C 6.2 (H) 08/12/2016   No results found for: PROLACTIN No results found for: CHOL, TRIG, HDL, CHOLHDL, VLDL, LDLCALC No results found for: TSH  Therapeutic Level Labs: No results found for: LITHIUM No results found for: VALPROATE No components found for:  CBMZ  Current Medications: Current Outpatient Medications  Medication Sig Dispense Refill  . busPIRone (BUSPAR) 10 MG tablet Take 1 tablet (10 mg total) by mouth 3 (three) times daily. 90 tablet 1  . lisinopril (PRINIVIL,ZESTRIL) 20 MG tablet Take 20 mg by mouth daily.    . sertraline (ZOLOFT) 100 MG tablet Take 1 tablet (100 mg total) by mouth daily. 30 tablet 1  . buPROPion (WELLBUTRIN XL) 150 MG 24 hr tablet Take 1 tablet (150 mg total) by mouth daily. 30 tablet 1  . Norethindrone-Ethinyl Estradiol-Fe Biphas (LO LOESTRIN FE) 1 MG-10 MCG / 10 MCG tablet Take 1 tablet by mouth daily. (Patient not taking: Reported on 02/24/2019) 3 Package 3   No current facility-administered medications for this visit.      Musculoskeletal: Strength & Muscle Tone: within normal limits Gait & Station: normal Patient leans: N/A  Psychiatric Specialty Exam: Review of Systems  Psychiatric/Behavioral: Positive for  depression and memory loss. Negative for hallucinations, substance abuse and suicidal ideas. The patient is nervous/anxious and has insomnia.   All other systems reviewed and are negative.   Blood pressure 117/78, pulse 98, weight 259 lb (117.5 kg), SpO2 96 %, not currently breastfeeding.Body mass index is 43.1 kg/m.  General Appearance: Fairly Groomed  Eye Contact:  Good  Speech:  Clear and Coherent  Volume:  Normal  Mood:  Depressed  Affect:  Appropriate, Congruent and Restricted  Thought Process:  Coherent  Orientation:  Full (Time, Place, and Person)  Thought Content: Logical   Suicidal Thoughts:  No  Homicidal Thoughts:  No  Memory:  Immediate;   Good  Judgement:  Good  Insight:  Fair  Psychomotor Activity:  Normal  Concentration:  Concentration: Good and Attention Span: Good  Recall:  Good  Fund of Knowledge: Good  Language: Good  Akathisia:  No  Handed:  Right  AIMS (if indicated): not done  Assets:  Communication Skills Desire for Improvement  ADL's:  Intact  Cognition: WNL  Sleep:  Poor   Screenings: PHQ2-9     Office Visit from 11/26/2017 in [facility] Office Visit from 05/18/2017 in [facility]  PHQ-2 Total Score  1  6  PHQ-9 Total Score  -  22       Assessment and Plan:  DARIANNA AGNE is a 33 y.o. year old female with a history of depression, hypertension, diabetes , who presents for follow up appointment for Moderate episode of recurrent major depressive disorder (HCC)  # MDD, moderate, recurrent without psychotic features Patient continues to report depressive symptoms with prominent fatigue since the last visit.  Will uptitrate sertraline to target depression and anxiety.  Will continue to monitor side effect of headache and GI side effect.  Will switch to extended release of bupropion as adjunctive treatment for depression.  Discussed risk of headache and worsening anxiety.  She has no known history of seizure.  Will continue BuSpar for  anxiety.  Discussed behavioral activation.  She will greatly benefit from therapy; will make referral.   Plan 1. Increase sertraline 100 mg daily  2. Switch to bupropion 150 mg daily  3. Continue Buspar 10 mg thee times a day  4. Return to clinic in two months for 15 mins 5. .Referral to therapy   -TSH wnl in 06/2018  Past trials of medication: sertraline. bupropion, lithium, Buspar, Ritalin  The patient demonstrates the following risk factors for suicide: Chronic risk factors for suicide include: psychiatric disorder of depression. Acute risk factors for suicide include: unemployment. Protective factors for this patient include: positive social support, responsibility to others (children, family), coping skills and hope for the future. Considering these factors, the overall suicide risk at this point appears to be low. Patient is appropriate for outpatient follow up.  The duration of this appointment visit was 25 minutes of face-to-face time with the patient.  Greater than 50% of this time was spent in counseling, explanation of  diagnosis, planning of further management, and coordination of care.  Neysa Hotter, MD 02/24/2019, 10:01 AM

## 2019-02-24 ENCOUNTER — Ambulatory Visit (INDEPENDENT_AMBULATORY_CARE_PROVIDER_SITE_OTHER): Payer: PRIVATE HEALTH INSURANCE | Admitting: Psychiatry

## 2019-02-24 ENCOUNTER — Encounter

## 2019-02-24 ENCOUNTER — Encounter (HOSPITAL_COMMUNITY): Payer: Self-pay | Admitting: Psychiatry

## 2019-02-24 VITALS — BP 117/78 | HR 98 | Ht 65.0 in | Wt 259.0 lb

## 2019-02-24 DIAGNOSIS — F331 Major depressive disorder, recurrent, moderate: Secondary | ICD-10-CM | POA: Diagnosis not present

## 2019-02-24 MED ORDER — BUPROPION HCL ER (XL) 150 MG PO TB24: 150.0000 mg | ORAL_TABLET | Freq: Every day | ORAL | 1 refills | Status: DC

## 2019-02-24 MED ORDER — SERTRALINE HCL 100 MG PO TABS: 100.0000 mg | ORAL_TABLET | Freq: Every day | ORAL | 1 refills | Status: DC

## 2019-02-24 NOTE — Patient Instructions (Signed)
1. Increase sertraline 100 mg daily  2. Switch to bupropion 150 mg daily  3. Continue buspar 10 mg thee times a day  4. Return to clinic in two months for 15 mins 5. .Referral to therapy

## 2019-04-19 ENCOUNTER — Telehealth: Payer: Self-pay | Admitting: Adult Health

## 2019-04-19 NOTE — Telephone Encounter (Signed)
Patient called, scheduled a p&p for 06/28/19 w/Jennifer.  She is out of bc, stated that she hasn't been taking any, but would like to start back.  [facility] [place]  [phone]

## 2019-04-20 MED ORDER — NORETHIN-ETH ESTRAD-FE BIPHAS 1 MG-10 MCG / 10 MCG PO TABS: 1.0000 | ORAL_TABLET | Freq: Every day | ORAL | 4 refills | Status: DC

## 2019-04-20 NOTE — Progress Notes (Deleted)
BH MD/PA/NP OP Progress Note  04/20/2019 12:38 PM Katelyn Avila  MRN:  [id number]  Chief Complaint:  HPI: *** Visit Diagnosis: No diagnosis found.  Past Psychiatric History: Please see initial evaluation for full details. I have reviewed the history. No updates at this time.     Past Medical History:  Past Medical History:  Diagnosis Date  . 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 ADD (attention deficit disorder)   . Anemia   . Carpal tunnel syndrome, bilateral   . Cervical high risk HPV (human papillomavirus) test positive 07/25/2015  . Complication of anesthesia    states woke up during T & A  . Gestational diabetes   . Hidradenitis 07/23/2015  . Hypertension    under control "most of the time"; has been on med. since 09/2014  . Neuromuscular disorder (HCC)   . Pain with urination 07/23/2015  . PCOS (polycystic ovarian syndrome)   . Seasonal allergies     Past Surgical History:  Procedure Laterality Date  . CARPAL TUNNEL RELEASE Right 02/22/2015   Procedure: RIGHT CARPAL TUNNEL RELEASE;  Surgeon: Betha Loa, MD;  Location: [facility];  Service: Orthopedics;  Laterality: Right;  . CESAREAN SECTION    . CESAREAN SECTION N/A 03/12/2017   Procedure: CESAREAN SECTION;  Surgeon: Catalina Antigua, MD;  [facility];  Service: Obstetrics;  Laterality: N/A;  . TONSILLECTOMY AND ADENOIDECTOMY    . WISDOM TOOTH EXTRACTION      Family Psychiatric History: Please see initial evaluation for full details. I have reviewed the history. No updates at this time.     Family History:  Family History  Problem Relation Age of Onset  . Fibroids Mother   . Hypertension Mother   . Endometriosis Mother   . Alcohol abuse Mother   . Drug abuse Mother   . Hypertension Father   . Alcohol abuse Father   . Hypertension Brother   . Drug abuse Brother   . Hypertension Maternal Grandmother   . Diabetes Maternal Grandmother   . COPD Maternal Grandmother   . Hypertension Maternal  Grandfather   . Hypertension Paternal Grandmother   . Cancer Paternal Grandmother        breast  . Hypertension Paternal Grandfather     Social History:  Social History   Socioeconomic History  . Marital status: Married    Spouse name: Not on file  . Number of children: Not on file  . Years of education: Not on file  . Highest education level: Not on file  Occupational History  . Not on file  Social Needs  . Financial resource strain: Not on file  . Food insecurity:    Worry: Not on file    Inability: Not on file  . Transportation needs:    Medical: Not on file    Non-medical: Not on file  Tobacco Use  . Smoking status: Former Smoker    Packs/day: 0.50    Years: 13.00    Pack years: 6.50    Types: Cigarettes    Last attempt to quit: 07/11/2016    Years since quitting: 2.7  . Smokeless tobacco: Never Used  Substance and Sexual Activity  . Alcohol use: No    Comment: 1-2 x/week and weekends-before pregnancy  . Drug use: No  . Sexual activity: Yes    Birth control/protection: Pill  Lifestyle  . Physical activity:    Days per week: Not on file    Minutes per session: Not on  file  . Stress: Not on file  Relationships  . Social connections:    Talks on phone: Not on file    Gets together: Not on file    Attends religious service: Not on file    Active member of club or organization: Not on file    Attends meetings of clubs or organizations: Not on file    Relationship status: Not on file  Other Topics Concern  . Not on file  Social History Narrative  . Not on file    Allergies: No Known Allergies  Metabolic Disorder Labs: Lab Results  Component Value Date   HGBA1C 6.2 (H) 08/12/2016   No results found for: PROLACTIN No results found for: CHOL, TRIG, HDL, CHOLHDL, VLDL, LDLCALC No results found for: TSH  Therapeutic Level Labs: No results found for: LITHIUM No results found for: VALPROATE No components found for:  CBMZ  Current Medications: Current  Outpatient Medications  Medication Sig Dispense Refill  . buPROPion (WELLBUTRIN XL) 150 MG 24 hr tablet Take 1 tablet (150 mg total) by mouth daily. 30 tablet 1  . busPIRone (BUSPAR) 10 MG tablet Take 1 tablet (10 mg total) by mouth 3 (three) times daily. 90 tablet 1  . lisinopril (PRINIVIL,ZESTRIL) 20 MG tablet Take 20 mg by mouth daily.    . Norethindrone-Ethinyl Estradiol-Fe Biphas (LO LOESTRIN FE) 1 MG-10 MCG / 10 MCG tablet Take 1 tablet by mouth daily. 3 Package 4  . sertraline (ZOLOFT) 100 MG tablet Take 1 tablet (100 mg total) by mouth daily. 30 tablet 1   No current facility-administered medications for this visit.      Musculoskeletal: Strength & Muscle Tone: N/A Gait & Station: N/A Patient leans: N/A  Psychiatric Specialty Exam: ROS  There were no vitals taken for this visit.There is no height or weight on file to calculate BMI.  General Appearance: Fairly Groomed  Eye Contact:  {BHH EYE CONTACT:22684}  Speech:  Clear and Coherent  Volume:  Normal  Mood:  {BHH MOOD:22306}  Affect:  {Affect (PAA):22687}  Thought Process:  Coherent  Orientation:  Full (Time, Place, and Person)  Thought Content: Logical   Suicidal Thoughts:  {ST/HT (PAA):22692}  Homicidal Thoughts:  {ST/HT (PAA):22692}  Memory:  Immediate;   Good  Judgement:  {Judgement (PAA):22694}  Insight:  {Insight (PAA):22695}  Psychomotor Activity:  Normal  Concentration:  Concentration: Good and Attention Span: Good  Recall:  Good  Fund of Knowledge: Good  Language: Good  Akathisia:  No  Handed:  Right  AIMS (if indicated): not done  Assets:  Communication Skills Desire for Improvement  ADL's:  Intact  Cognition: WNL  Sleep:  {BHH GOOD/FAIR/POOR:22877}   Screenings: PHQ2-9     Office Visit from 11/26/2017 in [facility] Office Visit from 05/18/2017 in [facility]  PHQ-2 Total Score  1  6  PHQ-9 Total Score  -  22       Assessment and Plan:  Katelyn Avila is a 33 y.o. year old  female with a history of depression, hypertension, diabetes , who presents for follow up appointment for No diagnosis found.  # MDD, moderate, recurrent without psychotic features  Patient continues to report depressive symptoms with prominent fatigue since the last visit.  Will uptitrate sertraline to target depression and anxiety.  Will continue to monitor side effect of headache and GI side effect.  Will switch to extended release of bupropion as adjunctive treatment for depression.  Discussed risk of headache and  worsening anxiety.  She has no known history of seizure.  Will continue BuSpar for anxiety.  Discussed behavioral activation.  She will greatly benefit from therapy; will make referral.   Plan 1. Increase sertraline 100 mg daily  2. Switch to bupropion 150 mg daily  3. Continue Buspar 10 mg thee times a day  4. Return to clinic in two months for 15 mins 5. .Referral to therapy  -TSH wnl in 06/2018  Past trials of medication: sertraline. bupropion, lithium, Buspar, Ritalin  The patient demonstrates the following risk factors for suicide: Chronic risk factors for suicide include:psychiatric disorder ofdepression. Acute risk factorsfor suicide include: unemployment. Protective factorsfor this patient include: positive social support, responsibility to others (children, family), coping skills and hope for the future. Considering these factors, the overall suicide risk at this point appears to below. Patientisappropriate for outpatient follow up.  Neysa Hottereina Vanna Sailer, MD 04/20/2019, 12:38 PM

## 2019-04-20 NOTE — Telephone Encounter (Signed)
Left message Rx sent for lo Loestrin to start with next period, and use back for 1 pack

## 2019-04-20 NOTE — Addendum Note (Signed)
Addended by: Cyril Mourning A on: 04/20/2019 10:23 AM   Modules accepted: Orders

## 2019-04-21 ENCOUNTER — Telehealth: Payer: Self-pay | Admitting: Adult Health

## 2019-04-21 NOTE — Telephone Encounter (Signed)
Pt states that the Norethindrone-Ethinyl Estradiol-Fe Biphas (LO LOESTRIN FE) 1 MG-10 MCG / 10 MCG tablet  Is going to be over $400 and she is wanting to see if something else can be sent in.

## 2019-04-21 NOTE — Telephone Encounter (Signed)
Spoke with pt letting her know can come by and pick up Lo Loestrin sample. Lot # [id number] A exp 12/20. Pt will pick up tomorrow. JSY

## 2019-04-25 ENCOUNTER — Other Ambulatory Visit: Payer: Self-pay

## 2019-04-25 ENCOUNTER — Ambulatory Visit (HOSPITAL_COMMUNITY): Payer: PRIVATE HEALTH INSURANCE | Admitting: Psychiatry

## 2019-04-26 NOTE — Progress Notes (Deleted)
BH MD/PA/NP OP Progress Note  04/26/2019 1:49 PM Katelyn Avila  MRN:  [id number]  Chief Complaint:  HPI: *** Visit Diagnosis: No diagnosis found.  Past Psychiatric History: Please see initial evaluation for full details. I have reviewed the history. No updates at this time.     Past Medical History:  Past Medical History:  Diagnosis Date  . 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 ADD (attention deficit disorder)   . Anemia   . Carpal tunnel syndrome, bilateral   . Cervical high risk HPV (human papillomavirus) test positive 07/25/2015  . Complication of anesthesia    states woke up during T & A  . Gestational diabetes   . Hidradenitis 07/23/2015  . Hypertension    under control "most of the time"; has been on med. since 09/2014  . Neuromuscular disorder (HCC)   . Pain with urination 07/23/2015  . PCOS (polycystic ovarian syndrome)   . Seasonal allergies     Past Surgical History:  Procedure Laterality Date  . CARPAL TUNNEL RELEASE Right 02/22/2015   Procedure: RIGHT CARPAL TUNNEL RELEASE;  Surgeon: Betha LoaKevin Kuzma, MD;  [facility];  Service: Orthopedics;  Laterality: Right;  . CESAREAN SECTION    . CESAREAN SECTION N/A 03/12/2017   Procedure: CESAREAN SECTION;  Surgeon: Catalina AntiguaPeggy Constant, MD;  [facility]: [facility];  Service: Obstetrics;  Laterality: N/A;  . TONSILLECTOMY AND ADENOIDECTOMY    . WISDOM TOOTH EXTRACTION      Family Psychiatric History: Please see initial evaluation for full details. I have reviewed the history. No updates at this time.     Family History:  Family History  Problem Relation Age of Onset  . Fibroids Mother   . Hypertension Mother   . Endometriosis Mother   . Alcohol abuse Mother   . Drug abuse Mother   . Hypertension Father   . Alcohol abuse Father   . Hypertension Brother   . Drug abuse Brother   . Hypertension Maternal Grandmother   . Diabetes Maternal Grandmother   . COPD Maternal Grandmother   . Hypertension Maternal  Grandfather   . Hypertension Paternal Grandmother   . Cancer Paternal Grandmother        breast  . Hypertension Paternal Grandfather     Social History:  Social History   Socioeconomic History  . Marital status: Married    Spouse name: Not on file  . Number of children: Not on file  . Years of education: Not on file  . Highest education level: Not on file  Occupational History  . Not on file  Social Needs  . Financial resource strain: Not on file  . Food insecurity:    Worry: Not on file    Inability: Not on file  . Transportation needs:    Medical: Not on file    Non-medical: Not on file  Tobacco Use  . Smoking status: Former Smoker    Packs/day: 0.50    Years: 13.00    Pack years: 6.50    Types: Cigarettes    Last attempt to quit: 07/11/2016    Years since quitting: 2.7  . Smokeless tobacco: Never Used  Substance and Sexual Activity  . Alcohol use: No    Comment: 1-2 x/week and weekends-before pregnancy  . Drug use: No  . Sexual activity: Yes    Birth control/protection: Pill  Lifestyle  . Physical activity:    Days per week: Not on file    Minutes per session: Not on  file  . Stress: Not on file  Relationships  . Social connections:    Talks on phone: Not on file    Gets together: Not on file    Attends religious service: Not on file    Active member of club or organization: Not on file    Attends meetings of clubs or organizations: Not on file    Relationship status: Not on file  Other Topics Concern  . Not on file  Social History Narrative  . Not on file    Allergies: No Known Allergies  Metabolic Disorder Labs: Lab Results  Component Value Date   HGBA1C 6.2 (H) 08/12/2016   No results found for: PROLACTIN No results found for: CHOL, TRIG, HDL, CHOLHDL, VLDL, LDLCALC No results found for: TSH  Therapeutic Level Labs: No results found for: LITHIUM No results found for: VALPROATE No components found for:  CBMZ  Current Medications: Current  Outpatient Medications  Medication Sig Dispense Refill  . buPROPion (WELLBUTRIN XL) 150 MG 24 hr tablet Take 1 tablet (150 mg total) by mouth daily. 30 tablet 1  . busPIRone (BUSPAR) 10 MG tablet Take 1 tablet (10 mg total) by mouth 3 (three) times daily. 90 tablet 1  . lisinopril (PRINIVIL,ZESTRIL) 20 MG tablet Take 20 mg by mouth daily.    . Norethindrone-Ethinyl Estradiol-Fe Biphas (LO LOESTRIN FE) 1 MG-10 MCG / 10 MCG tablet Take 1 tablet by mouth daily. 3 Package 4  . sertraline (ZOLOFT) 100 MG tablet Take 1 tablet (100 mg total) by mouth daily. 30 tablet 1   No current facility-administered medications for this visit.      Musculoskeletal: Strength & Muscle Tone: N/A Gait & Station: N/A Patient leans: N/A  Psychiatric Specialty Exam: ROS  There were no vitals taken for this visit.There is no height or weight on file to calculate BMI.  General Appearance: {Appearance:22683}  Eye Contact:  {BHH EYE CONTACT:22684}  Speech:  Clear and Coherent  Volume:  Normal  Mood:  {BHH MOOD:22306}  Affect:  {Affect (PAA):22687}  Thought Process:  Coherent  Orientation:  Full (Time, Place, and Person)  Thought Content: Logical   Suicidal Thoughts:  {ST/HT (PAA):22692}  Homicidal Thoughts:  {ST/HT (PAA):22692}  Memory:  Immediate;   Good  Judgement:  {Judgement (PAA):22694}  Insight:  {Insight (PAA):22695}  Psychomotor Activity:  Normal  Concentration:  Concentration: Good and Attention Span: Good  Recall:  Good  Fund of Knowledge: Good  Language: Good  Akathisia:  No  Handed:  Right  AIMS (if indicated): not done  Assets:  Communication Skills Desire for Improvement  ADL's:  Intact  Cognition: WNL  Sleep:  {BHH GOOD/FAIR/POOR:22877}   Screenings: PHQ2-9     Office Visit from 11/26/2017 in [facility] Office Visit from 05/18/2017 in [facility]  PHQ-2 Total Score  1  6  PHQ-9 Total Score  -  22       Assessment and Plan:  Katelyn Avila is a 33 y.o. year old  female with a history of depression, hypertension, diabetes , who presents for follow up appointment for No diagnosis found.  # MDD, moderate, recurrent without psychotic features  Patient continues to report depressive symptoms with prominent fatigue since the last visit.  Will uptitrate sertraline to target depression and anxiety.  Will continue to monitor side effect of headache and GI side effect.  Will switch to extended release of bupropion as adjunctive treatment for depression.  Discussed risk of headache and worsening  anxiety.  She has no known history of seizure.  Will continue BuSpar for anxiety.  Discussed behavioral activation.  She will greatly benefit from therapy; will make referral.   Plan 1. Increase sertraline 100 mg daily  2. Switch to bupropion 150 mg daily  3. Continue Buspar 10 mg thee times a day  4. Return to clinic in two months for 15 mins 5. .Referral to therapy  -TSH wnl in 06/2018  Past trials of medication: sertraline. bupropion, lithium, Buspar, Ritalin  The patient demonstrates the following risk factors for suicide: Chronic risk factors for suicide include:psychiatric disorder ofdepression. Acute risk factorsfor suicide include: unemployment. Protective factorsfor this patient include: positive social support, responsibility to others (children, family), coping skills and hope for the future. Considering these factors, the overall suicide risk at this point appears to below. Patientisappropriate for outpatient follow up.  Neysa Hotter, MD 04/26/2019, 1:49 PM

## 2019-04-27 ENCOUNTER — Ambulatory Visit (HOSPITAL_COMMUNITY): Payer: PRIVATE HEALTH INSURANCE | Admitting: Psychiatry

## 2019-04-27 ENCOUNTER — Other Ambulatory Visit: Payer: Self-pay

## 2019-04-27 ENCOUNTER — Telehealth (HOSPITAL_COMMUNITY): Payer: Self-pay | Admitting: Psychiatry

## 2019-04-27 NOTE — Telephone Encounter (Signed)
Therapist attempted to contact patient via text for scheduled appointment. No response

## 2019-04-28 ENCOUNTER — Ambulatory Visit (HOSPITAL_COMMUNITY): Payer: PRIVATE HEALTH INSURANCE | Admitting: Psychiatry

## 2019-04-29 ENCOUNTER — Telehealth (HOSPITAL_COMMUNITY): Payer: Self-pay | Admitting: *Deleted

## 2019-04-29 ENCOUNTER — Other Ambulatory Visit (HOSPITAL_COMMUNITY): Payer: Self-pay | Admitting: Psychiatry

## 2019-04-29 MED ORDER — BUPROPION HCL ER (XL) 150 MG PO TB24
150.0000 mg | ORAL_TABLET | Freq: Every day | ORAL | 0 refills | Status: DC
Start: 2019-04-29 — End: 2019-05-10

## 2019-04-29 MED ORDER — SERTRALINE HCL 100 MG PO TABS: 100.0000 mg | ORAL_TABLET | Freq: Every day | ORAL | 0 refills | Status: DC

## 2019-04-29 NOTE — Telephone Encounter (Signed)
ordered

## 2019-04-29 NOTE — Telephone Encounter (Signed)
Dr Vanetta Shawl Patient reschedule appointment for  05/06/2019 with front office . She is requesting refill on medication for Zoloft

## 2019-04-29 NOTE — Telephone Encounter (Signed)
Dr Vanetta Shawl Due to static on the phones I missed patient requesting refills also on Wellbutrin

## 2019-05-03 NOTE — Progress Notes (Deleted)
BH MD/PA/NP OP Progress Note  05/03/2019 9:03 AM Katelyn Avila  MRN:  [id number]  Chief Complaint:  HPI: *** Visit Diagnosis: No diagnosis found.  Past Psychiatric History: Please see initial evaluation for full details. I have reviewed the history. No updates at this time.     Past Medical History:  Past Medical History:  Diagnosis Date  . 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 ADD (attention deficit disorder)   . Anemia   . Carpal tunnel syndrome, bilateral   . Cervical high risk HPV (human papillomavirus) test positive 07/25/2015  . Complication of anesthesia    states woke up during T & A  . Gestational diabetes   . Hidradenitis 07/23/2015  . Hypertension    under control "most of the time"; has been on med. since 09/2014  . Neuromuscular disorder (HCC)   . Pain with urination 07/23/2015  . PCOS (polycystic ovarian syndrome)   . Seasonal allergies     Past Surgical History:  Procedure Laterality Date  . CARPAL TUNNEL RELEASE Right 02/22/2015   Procedure: RIGHT CARPAL TUNNEL RELEASE;  Surgeon: Betha Loa, MD;  Location: [facility];  Service: Orthopedics;  Laterality: Right;  . CESAREAN SECTION    . CESAREAN SECTION N/A 03/12/2017   Procedure: CESAREAN SECTION;  Surgeon: Catalina Antigua, MD;  [facility];  Service: Obstetrics;  Laterality: N/A;  . TONSILLECTOMY AND ADENOIDECTOMY    . WISDOM TOOTH EXTRACTION      Family Psychiatric History: Please see initial evaluation for full details. I have reviewed the history. No updates at this time.     Family History:  Family History  Problem Relation Age of Onset  . Fibroids Mother   . Hypertension Mother   . Endometriosis Mother   . Alcohol abuse Mother   . Drug abuse Mother   . Hypertension Father   . Alcohol abuse Father   . Hypertension Brother   . Drug abuse Brother   . Hypertension Maternal Grandmother   . Diabetes Maternal Grandmother   . COPD Maternal Grandmother   . Hypertension Maternal  Grandfather   . Hypertension Paternal Grandmother   . Cancer Paternal Grandmother        breast  . Hypertension Paternal Grandfather     Social History:  Social History   Socioeconomic History  . Marital status: Married    Spouse name: Not on file  . Number of children: Not on file  . Years of education: Not on file  . Highest education level: Not on file  Occupational History  . Not on file  Social Needs  . Financial resource strain: Not on file  . Food insecurity:    Worry: Not on file    Inability: Not on file  . Transportation needs:    Medical: Not on file    Non-medical: Not on file  Tobacco Use  . Smoking status: Former Smoker    Packs/day: 0.50    Years: 13.00    Pack years: 6.50    Types: Cigarettes    Last attempt to quit: 07/11/2016    Years since quitting: 2.8  . Smokeless tobacco: Never Used  Substance and Sexual Activity  . Alcohol use: No    Comment: 1-2 x/week and weekends-before pregnancy  . Drug use: No  . Sexual activity: Yes    Birth control/protection: Pill  Lifestyle  . Physical activity:    Days per week: Not on file    Minutes per session: Not  on file  . Stress: Not on file  Relationships  . Social connections:    Talks on phone: Not on file    Gets together: Not on file    Attends religious service: Not on file    Active member of club or organization: Not on file    Attends meetings of clubs or organizations: Not on file    Relationship status: Not on file  Other Topics Concern  . Not on file  Social History Narrative  . Not on file    Allergies: No Known Allergies  Metabolic Disorder Labs: Lab Results  Component Value Date   HGBA1C 6.2 (H) 08/12/2016   No results found for: PROLACTIN No results found for: CHOL, TRIG, HDL, CHOLHDL, VLDL, LDLCALC No results found for: TSH  Therapeutic Level Labs: No results found for: LITHIUM No results found for: VALPROATE No components found for:  CBMZ  Current Medications: Current  Outpatient Medications  Medication Sig Dispense Refill  . buPROPion (WELLBUTRIN XL) 150 MG 24 hr tablet Take 1 tablet (150 mg total) by mouth daily. 30 tablet 0  . busPIRone (BUSPAR) 10 MG tablet Take 1 tablet (10 mg total) by mouth 3 (three) times daily. 90 tablet 1  . lisinopril (PRINIVIL,ZESTRIL) 20 MG tablet Take 20 mg by mouth daily.    . Norethindrone-Ethinyl Estradiol-Fe Biphas (LO LOESTRIN FE) 1 MG-10 MCG / 10 MCG tablet Take 1 tablet by mouth daily. 3 Package 4  . sertraline (ZOLOFT) 100 MG tablet Take 1 tablet (100 mg total) by mouth daily. 30 tablet 0   No current facility-administered medications for this visit.      Musculoskeletal: Strength & Muscle Tone: N/A Gait & Station: N/A Patient leans: N/A  Psychiatric Specialty Exam: ROS  There were no vitals taken for this visit.There is no height or weight on file to calculate BMI.  General Appearance: {Appearance:22683}  Eye Contact:  {BHH EYE CONTACT:22684}  Speech:  Clear and Coherent  Volume:  Normal  Mood:  {BHH MOOD:22306}  Affect:  {Affect (PAA):22687}  Thought Process:  Coherent  Orientation:  Full (Time, Place, and Person)  Thought Content: Logical   Suicidal Thoughts:  {ST/HT (PAA):22692}  Homicidal Thoughts:  {ST/HT (PAA):22692}  Memory:  Immediate;   Good  Judgement:  {Judgement (PAA):22694}  Insight:  {Insight (PAA):22695}  Psychomotor Activity:  Normal  Concentration:  Concentration: Good and Attention Span: Good  Recall:  Good  Fund of Knowledge: Good  Language: Good  Akathisia:  No  Handed:  Right  AIMS (if indicated): not done  Assets:  Communication Skills Desire for Improvement  ADL's:  Intact  Cognition: WNL  Sleep:  {BHH GOOD/FAIR/POOR:22877}   Screenings: PHQ2-9     Office Visit from 11/26/2017 in [facility] Office Visit from 05/18/2017 in [facility]  PHQ-2 Total Score  1  6  PHQ-9 Total Score  -  22       Assessment and Plan:  Katelyn Avila is a 33 y.o. year old  female with a history of depression, hypertension, diabetes  , who presents for follow up appointment for No diagnosis found.  # MDD, moderate, recurrent without psychotic features   Patient continues to report depressive symptoms with prominent fatigue since the last visit.  Will uptitrate sertraline to target depression and anxiety.  Will continue to monitor side effect of headache and GI side effect.  Will switch to extended release of bupropion as adjunctive treatment for depression.  Discussed risk of  headache and worsening anxiety.  She has no known history of seizure.  Will continue BuSpar for anxiety.  Discussed behavioral activation.  She will greatly benefit from therapy; will make referral.   Plan 1. Increase sertraline 100 mg daily  2. Switch to bupropion 150 mg daily  3. Continue Buspar 10 mg thee times a day  4. Return to clinic in two months for 15 mins 5. .Referral to therapy  -TSH wnl in 06/2018  Past trials of medication: sertraline. bupropion, lithium, Buspar, Ritalin  The patient demonstrates the following risk factors for suicide: Chronic risk factors for suicide include:psychiatric disorder ofdepression. Acute risk factorsfor suicide include: unemployment. Protective factorsfor this patient include: positive social support, responsibility to others (children, family), coping skills and hope for the future. Considering these factors, the overall suicide risk at this point appears to below. Patientisappropriate for outpatient follow up.  Neysa Hottereina Damieon Armendariz, MD 05/03/2019, 9:03 AM

## 2019-05-06 ENCOUNTER — Other Ambulatory Visit: Payer: Self-pay

## 2019-05-06 ENCOUNTER — Encounter (HOSPITAL_COMMUNITY): Payer: PRIVATE HEALTH INSURANCE | Admitting: Psychiatry

## 2019-05-06 NOTE — Progress Notes (Signed)
This encounter was created in error - please disregard.

## 2019-05-10 ENCOUNTER — Other Ambulatory Visit (HOSPITAL_COMMUNITY): Payer: Self-pay | Admitting: Psychiatry

## 2019-05-10 MED ORDER — BUSPIRONE HCL 10 MG PO TABS: 10.0000 mg | ORAL_TABLET | Freq: Three times a day (TID) | ORAL | 0 refills | Status: AC

## 2019-05-10 MED ORDER — BUPROPION HCL ER (XL) 150 MG PO TB24: 150.0000 mg | ORAL_TABLET | Freq: Every day | ORAL | 0 refills | Status: DC

## 2019-05-10 MED ORDER — SERTRALINE HCL 100 MG PO TABS: 100.0000 mg | ORAL_TABLET | Freq: Every day | ORAL | 0 refills | Status: AC

## 2019-06-28 ENCOUNTER — Other Ambulatory Visit: Payer: Self-pay | Admitting: Adult Health

## 2019-07-22 ENCOUNTER — Other Ambulatory Visit: Payer: PRIVATE HEALTH INSURANCE | Admitting: Adult Health

## 2019-08-04 ENCOUNTER — Other Ambulatory Visit: Payer: Self-pay

## 2019-08-04 DIAGNOSIS — Z20822 Contact with and (suspected) exposure to covid-19: Secondary | ICD-10-CM

## 2019-08-04 NOTE — Progress Notes (Signed)
lab

## 2019-08-05 LAB — NOVEL CORONAVIRUS, NAA: SARS-CoV-2, NAA: NOT DETECTED

## 2019-08-22 ENCOUNTER — Other Ambulatory Visit: Payer: PRIVATE HEALTH INSURANCE | Admitting: Women's Health

## 2019-10-17 ENCOUNTER — Other Ambulatory Visit: Payer: PRIVATE HEALTH INSURANCE | Admitting: Women's Health

## 2020-03-26 ENCOUNTER — Other Ambulatory Visit (HOSPITAL_BASED_OUTPATIENT_CLINIC_OR_DEPARTMENT_OTHER): Payer: Self-pay

## 2020-03-26 DIAGNOSIS — G4710 Hypersomnia, unspecified: Secondary | ICD-10-CM

## 2020-03-26 DIAGNOSIS — R5383 Other fatigue: Secondary | ICD-10-CM

## 2020-03-26 DIAGNOSIS — R0683 Snoring: Secondary | ICD-10-CM

## 2020-03-30 ENCOUNTER — Ambulatory Visit: Payer: PRIVATE HEALTH INSURANCE | Attending: Family Medicine | Admitting: Neurology

## 2020-03-30 ENCOUNTER — Other Ambulatory Visit: Payer: Self-pay

## 2020-03-30 DIAGNOSIS — G4710 Hypersomnia, unspecified: Secondary | ICD-10-CM | POA: Diagnosis not present

## 2020-03-30 DIAGNOSIS — G4733 Obstructive sleep apnea (adult) (pediatric): Secondary | ICD-10-CM

## 2020-03-30 DIAGNOSIS — R0683 Snoring: Secondary | ICD-10-CM | POA: Insufficient documentation

## 2020-03-30 DIAGNOSIS — R5383 Other fatigue: Secondary | ICD-10-CM

## 2020-04-15 NOTE — Procedures (Signed)
  [facility] Shardea Cwynar A. Gerilyn Pilgrim, MD     [email or web address]             HOME SLEEP TEST   LOCATION: [facility]   Patient Name: Katelyn Avila, Katelyn Avila Date: 03/30/2020 Gender: Female D.O.B: September 11, 1986 Age (years): 33 Referring Provider: Richardean Chimera Height (inches): 64 Interpreting Physician: Beryle Beams MD, ABSM Weight (lbs): 259 RPSGT: Peak, Robert BMI: 44 MRN: [id number] Neck Size: CLINICAL INFORMATION Sleep Study Type: HST     Indication for sleep study: N/A     Epworth Sleepiness Score: NA  SLEEP STUDY TECHNIQUE A multi-channel overnight portable sleep study was performed. The channels recorded were: nasal airflow, thoracic respiratory movement, and oxygen saturation with a pulse oximetry. Snoring was also monitored.  MEDICATIONS Patient self administered medications include: N/A.\ Current Outpatient Medications:  .  buPROPion (WELLBUTRIN XL) 150 MG 24 hr tablet, Take 1 tablet (150 mg total) by mouth daily., Disp: 30 tablet, Rfl: 0 .  busPIRone (BUSPAR) 10 MG tablet, Take 1 tablet (10 mg total) by mouth 3 (three) times daily., Disp: 90 tablet, Rfl: 0 .  lisinopril (PRINIVIL,ZESTRIL) 20 MG tablet, Take 20 mg by mouth daily., Disp: , Rfl:  .  Norethindrone-Ethinyl Estradiol-Fe Biphas (LO LOESTRIN FE) 1 MG-10 MCG / 10 MCG tablet, Take 1 tablet by mouth daily., Disp: 3 Package, Rfl: 4 .  sertraline (ZOLOFT) 100 MG tablet, Take 1 tablet (100 mg total) by mouth daily., Disp: 30 tablet, Rfl: 0   SLEEP ARCHITECTURE Patient was studied for 654.9 minutes. The sleep efficiency was 91.0 % and the patient was supine for 6.5%. The arousal index was 0.0 per hour.  RESPIRATORY PARAMETERS The overall AHI was 79.8 per hour, with a central apnea index of 1.7 per hour.  The oxygen nadir was 72% during sleep.     CARDIAC DATA Mean heart rate during sleep was 74.0 bpm.  IMPRESSIONS Severe obstructive sleep apnea occurred during this study (AHI = 79.8/h).  AUTOPAP   8-20 is recommended.   Argie Ramming, MD Diplomate, American Board of Sleep Medicine. ELECTRONICALLY SIGNED ON:  04/15/2020, 10:07 PM [facility] PH: [phone]   FX: [phone] ACCREDITED BY THE AMERICAN ACADEMY OF SLEEP MEDICINE

## 2021-04-03 ENCOUNTER — Ambulatory Visit: Payer: PRIVATE HEALTH INSURANCE | Admitting: Adult Health

## 2021-04-25 ENCOUNTER — Ambulatory Visit: Payer: PRIVATE HEALTH INSURANCE | Admitting: Adult Health

## 2021-10-02 ENCOUNTER — Other Ambulatory Visit: Payer: PRIVATE HEALTH INSURANCE | Admitting: Adult Health

## 2022-01-21 ENCOUNTER — Other Ambulatory Visit (HOSPITAL_COMMUNITY)
Admission: RE | Admit: 2022-01-21 | Discharge: 2022-01-21 | Disposition: A | Discharge status: Home / Self Care | Payer: Medicaid Other | Source: Ambulatory Visit | Attending: Adult Health | Admitting: Adult Health

## 2022-01-21 ENCOUNTER — Other Ambulatory Visit: Payer: Self-pay

## 2022-01-21 ENCOUNTER — Encounter: Payer: Self-pay | Admitting: Adult Health

## 2022-01-21 ENCOUNTER — Ambulatory Visit (INDEPENDENT_AMBULATORY_CARE_PROVIDER_SITE_OTHER): Payer: Medicaid Other | Admitting: Adult Health

## 2022-01-21 VITALS — BP 128/83 | HR 82 | Ht 64.0 in | Wt 268.0 lb

## 2022-01-21 DIAGNOSIS — N946 Dysmenorrhea, unspecified: Secondary | ICD-10-CM

## 2022-01-21 DIAGNOSIS — Z01419 Encounter for gynecological examination (general) (routine) without abnormal findings: Secondary | ICD-10-CM

## 2022-01-21 DIAGNOSIS — N926 Irregular menstruation, unspecified: Secondary | ICD-10-CM

## 2022-01-21 DIAGNOSIS — N921 Excessive and frequent menstruation with irregular cycle: Secondary | ICD-10-CM

## 2022-01-21 HISTORY — DX: Dysmenorrhea, unspecified: N94.6

## 2022-01-21 LAB — POCT URINE PREGNANCY: Preg Test, Ur: NEGATIVE

## 2022-01-21 NOTE — Progress Notes (Signed)
Patient ID: Katelyn Avila, female   DOB: [birth year], 36 y.o.   MRN: [id number] History of Present Illness: Katelyn Avila is a 36 year old white female, married,G2P1102, in for a well woman gyn exam and pap.  She says periods heavier and having bad cramps, and has nausea when starts. PCP is Dr Reuel Boom.   Current Medications, Allergies, Past Medical History, Past Surgical History, Family History and Social History were reviewed in [employer] record.     Review of Systems: Patient denies any headaches, hearing loss, fatigue, blurred vision, shortness of breath, chest pain, abdominal pain, problems with bowel movements, urination, or intercourse. No joint pain or mood swings.  See HPI for positives.    Physical Exam:BP 128/83 (BP Location: Left Arm, Patient Position: Sitting, Cuff Size: Large)    Pulse 82    Ht 5\' 4"  (1.626 m)    Wt 268 lb (121.6 kg)    LMP 12/01/2021 (Approximate)    BMI 46.00 kg/m  UPT is negative  General:  Well developed, well nourished, no acute distress Skin:  Warm and dry Neck:  Midline trachea, normal thyroid, good ROM, no lymphadenopathy Lungs; Clear to auscultation bilaterally Breast:  No dominant palpable mass, retraction, or nipple discharge Cardiovascular: Regular rate and rhythm Abdomen:  Soft, non tender, no hepatosplenomegaly Pelvic:  External genitalia is normal in appearance, no lesions.Has HS inner thighs.  The vagina is normal in appearance. Urethra has no lesions or masses. The cervix is bulbous. Pap with HR HPV genotyping performed. Uterus is felt to be normal size, shape, and contour.  No adnexal masses or tenderness noted.Bladder is non tender, no masses felt. Rectal:Deferred Extremities/musculoskeletal:  No swelling or varicosities noted, no clubbing or cyanosis Psych:  No mood changes, alert and cooperative,seems happy AA is 1 Fall risk is low Depression screen [facility] 2/9 01/21/2022 11/26/2017 05/18/2017  Decreased Interest 0 0 3  Down,  Depressed, Hopeless 0 1 3  PHQ - 2 Score 0 1 6  Altered sleeping 0 - 3  Tired, decreased energy 0 - 3  Change in appetite 0 - 3  Feeling bad or failure about yourself  0 - 3  Trouble concentrating 0 - 1  Moving slowly or fidgety/restless 0 - 3  Suicidal thoughts 0 - 0  PHQ-9 Score 0 - 22  Difficult doing work/chores - - Very difficult    GAD 7 : Generalized Anxiety Score 01/21/2022  Nervous, Anxious, on Edge 0  Control/stop worrying 0  Worry too much - different things 0  Trouble relaxing 0  Restless 0  Easily annoyed or irritable 0  Afraid - awful might happen 0  Total GAD 7 Score 0      Upstream - 01/21/22 01/23/22       Pregnancy Intention Screening   Does the patient want to become pregnant in the next year? No    Does the patient's partner want to become pregnant in the next year? No    Would the patient like to discuss contraceptive options today? Yes      Contraception Wrap Up   Current Method Withdrawal or Other Method    End Method Withdrawal or Other Method   thinking tubal   Contraception Counseling Provided Yes             Examination chaperoned by [id number] LPN  Impression and Plan: 1. Encounter for gynecological examination with Papanicolaou smear of cervix Pap sent Physical in 1 year Pap in 3 if normal  2. Missed period UPT is negative   3. Menorrhagia with irregular cycle Discussed ablation as option and she is very interested, handout given, also handout for tubal sterilization, as she does not want nay more children. She did not like IUD or birth control  Return to see Dr Despina Hidden in about 2 weeks for pre op  4. Dysmenorrhea

## 2022-01-22 LAB — CYTOLOGY - PAP
Adequacy: ABSENT
Comment: NEGATIVE
Diagnosis: NEGATIVE
High risk HPV: NEGATIVE

## 2022-02-11 ENCOUNTER — Encounter: Payer: BC Managed Care – PPO | Admitting: Obstetrics & Gynecology

## 2022-02-27 ENCOUNTER — Other Ambulatory Visit: Payer: Self-pay

## 2022-02-27 ENCOUNTER — Ambulatory Visit (INDEPENDENT_AMBULATORY_CARE_PROVIDER_SITE_OTHER): Payer: BC Managed Care – PPO | Admitting: Obstetrics & Gynecology

## 2022-02-27 ENCOUNTER — Encounter: Payer: Self-pay | Admitting: Obstetrics & Gynecology

## 2022-02-27 DIAGNOSIS — N946 Dysmenorrhea, unspecified: Secondary | ICD-10-CM | POA: Diagnosis not present

## 2022-02-27 DIAGNOSIS — N921 Excessive and frequent menstruation with irregular cycle: Secondary | ICD-10-CM

## 2022-02-27 NOTE — Progress Notes (Signed)
Chief Complaint  Patient presents with   Pre-op Exam      36 y.o. [id number] No LMP recorded. (Menstrual status: Irregular Periods). The current method of family planning is abstinence.  Outpatient Encounter Medications as of 02/27/2022  Medication Sig   amphetamine-dextroamphetamine (ADDERALL) 30 MG tablet Take 1 tablet by mouth 2 (two) times daily.   buPROPion (WELLBUTRIN XL) 150 MG 24 hr tablet Take 1 tablet (150 mg total) by mouth daily.   busPIRone (BUSPAR) 10 MG tablet Take 1 tablet (10 mg total) by mouth 3 (three) times daily.   lisinopril-hydrochlorothiazide (ZESTORETIC) 20-25 MG tablet Take 1 tablet by mouth daily.   sertraline (ZOLOFT) 100 MG tablet Take 1 tablet (100 mg total) by mouth daily.   No facility-administered encounter medications on file as of 02/27/2022.    Subjective Menorrhagia dysmenorrhea with normal sonogram, no dyspareunia, declines IUD and other [facility]   Desires sterilization as well  All options discussed Plans for later this month are made  Past Medical History:  Diagnosis Date   A1/B DM 08/18/2016   ADD (attention deficit disorder)    Anemia    Carpal tunnel syndrome, bilateral    Cervical high risk HPV (human papillomavirus) test positive 123456   Complication of anesthesia    states woke up during T & A   Gestational diabetes    Hidradenitis 07/23/2015   Hypertension    under control "most of the time"; has been on med. since 09/2014   Neuromuscular disorder ([place])    Pain with urination 07/23/2015   PCOS (polycystic ovarian syndrome)    Seasonal allergies     Past Surgical History:  Procedure Laterality Date   CARPAL TUNNEL RELEASE Right 02/22/2015   Procedure: RIGHT CARPAL TUNNEL RELEASE;  Surgeon: Leanora Cover, MD;  Location: [place];  Service: Orthopedics;  Laterality: Right;   CESAREAN SECTION     CESAREAN SECTION N/A 03/12/2017   Procedure: CESAREAN SECTION;  Surgeon: Mora Bellman, MD;  Location: [place];  Service: Obstetrics;  Laterality: N/A;   TONSILLECTOMY AND ADENOIDECTOMY     WISDOM TOOTH EXTRACTION      OB History     Gravida  2   Para  2   Term  1   Preterm  1   AB      Living  2      SAB      IAB      Ectopic      Multiple      Live Births  2           No Known Allergies  Social History   Socioeconomic History   Marital status: Married    Spouse name: Not on file   Number of children: Not on file   Years of education: Not on file   Highest education level: Not on file  Occupational History   Not on file  Tobacco Use   Smoking status: Former    Packs/day: 0.50    Years: 13.00    Pack years: 6.50    Types: Cigarettes    Quit date: 07/11/2016    Years since quitting: 5.6   Smokeless tobacco: Never  Vaping Use   Vaping Use: Some days  Substance and Sexual Activity   Alcohol use: No    Comment: 1-2 x/week and weekends-before pregnancy   Drug use: No   Sexual activity: Yes    Birth control/protection: Coitus interruptus  Other  Topics Concern   Not on file  Social History Narrative   Not on file   Social Determinants of Health   Financial Resource Strain: Low Risk    Difficulty of Paying Living Expenses: Not hard at all  Food Insecurity: No Food Insecurity   Worried About Charity fundraiser in the Last Year: Never true   [place] in the Last Year: Never true  Transportation Needs: No Transportation Needs   Lack of Transportation (Medical): No   Lack of Transportation (Non-Medical): No  Physical Activity: Insufficiently Active   Days of Exercise per Week: 3 days   Minutes of Exercise per Session: 40 min  Stress: No Stress Concern Present   Feeling of Stress : Only a little  Social Connections: Moderately Integrated   Frequency of Communication with Friends and Family: Three times a week   Frequency of Social Gatherings with Friends and Family: Once a week   Attends Religious Services: 1 to 4 times per year    Active Member of [employer] or Organizations: No   Attends Music therapist: Patient refused   Marital Status: Married    Family History  Problem Relation Age of Onset   Fibroids Mother    Hypertension Mother    Endometriosis Mother    Alcohol abuse Mother    Drug abuse Mother    Hypertension Father    Alcohol abuse Father    Hypertension Brother    Drug abuse Brother    Hypertension Maternal Grandmother    Diabetes Maternal Grandmother    COPD Maternal Grandmother    Hypertension Maternal Grandfather    Hypertension Paternal Grandmother    Cancer Paternal Grandmother        breast   Hypertension Paternal Grandfather     Medications:       Current Outpatient Medications:    amphetamine-dextroamphetamine (ADDERALL) 30 MG tablet, Take 1 tablet by mouth 2 (two) times daily., Disp: , Rfl:    buPROPion (WELLBUTRIN XL) 150 MG 24 hr tablet, Take 1 tablet (150 mg total) by mouth daily., Disp: 30 tablet, Rfl: 0   busPIRone (BUSPAR) 10 MG tablet, Take 1 tablet (10 mg total) by mouth 3 (three) times daily., Disp: 90 tablet, Rfl: 0   lisinopril-hydrochlorothiazide (ZESTORETIC) 20-25 MG tablet, Take 1 tablet by mouth daily., Disp: , Rfl:    sertraline (ZOLOFT) 100 MG tablet, Take 1 tablet (100 mg total) by mouth daily., Disp: 30 tablet, Rfl: 0  Objective There were no vitals taken for this visit.  Gen WDWN NAD  Pertinent ROS No burning with urination, frequency or urgency No nausea, vomiting or diarrhea Nor fever chills or other constitutional symptoms   Labs or studies     Impression Diagnoses this Encounter::   ICD-10-CM   1. Menorrhagia with irregular cycle  N92.1 US PELVIS (TRANSABDOMINAL ONLY)    US PELVIS TRANSVAGINAL NON-OB (TV ONLY)    2. Dysmenorrhea  N94.6 US PELVIS (TRANSABDOMINAL ONLY)    US PELVIS TRANSVAGINAL NON-OB (TV ONLY)      Established relevant diagnosis(es):   Plan/Recommendations: No orders of the defined types were placed in this  encounter.   Labs or Scans Ordered: Orders Placed This Encounter  Procedures   US PELVIS (TRANSABDOMINAL ONLY)   US PELVIS TRANSVAGINAL NON-OB (TV ONLY)    Management:: Plan Lap salpingectomy + endometrial ablation  Follow up Return in about 5 weeks (around 04/04/2022) for [place] visit, with Dr Elonda Husky.  All questions were answered.

## 2022-03-13 ENCOUNTER — Other Ambulatory Visit: Payer: Self-pay | Admitting: *Deleted

## 2022-03-17 ENCOUNTER — Other Ambulatory Visit: Payer: BC Managed Care – PPO

## 2022-03-17 ENCOUNTER — Ambulatory Visit (HOSPITAL_COMMUNITY)
Admission: RE | Admit: 2022-03-17 | Discharge: 2022-03-17 | Disposition: A | Discharge status: Home / Self Care | Payer: BC Managed Care – PPO | Source: Ambulatory Visit | Attending: Obstetrics & Gynecology | Admitting: Obstetrics & Gynecology

## 2022-03-17 ENCOUNTER — Other Ambulatory Visit: Payer: Self-pay

## 2022-03-17 DIAGNOSIS — N946 Dysmenorrhea, unspecified: Secondary | ICD-10-CM | POA: Insufficient documentation

## 2022-03-17 DIAGNOSIS — N921 Excessive and frequent menstruation with irregular cycle: Secondary | ICD-10-CM | POA: Diagnosis present

## 2022-03-19 NOTE — Patient Instructions (Addendum)
? Your procedure is scheduled on: 03/26/2022 ? Report to [facility] Entr[facility] at     6:00 AM. ? Call this number if you have problems the morning of surgery: [phone] ? ? Remember: ? ? Do not Eat after midnight. ? ?  You may drink clear liquids until 0330 AM on 03/26/2022. ? ?  At 0330 AM on 03/26/2022 drink your carb drink. You can have nothing else to drink after this. ? ?      No Smoking the morning of surgery. ? ? ? : ? Take these medicines the morning of surgery with A SIP OF WATER: Wellbutrin, Buspar, and Zoloft ? ? ? ? Do not wear jewelry, make-up or nail polish. ? Do not wear lotions, powders, or perfumes. You may wear deodorant. ? Do not shave 48 hours prior to surgery. Men may shave face and neck. ? Do not bring valuables to the hospital. ? Contacts, dentures or bridgework may not be worn into surgery. ? Leave suitcase in the car. After surgery it may be brought to your room. ? For patients admitted to the hospital, checkout time is 11:00 AM the day of discharge. ? ? Patients discharged the day of surgery will not be allowed to drive home. ?  ? Special Instructions: Shower using CHG night before surgery and shower the day of surgery use CHG.  Use special wash - you have one bottle of CHG for all showers.  You should use approximately 1/2 of the bottle for each shower.  ?How to Use Chlorhexidine for Bathing ?Chlorhexidine gluconate (CHG) is a germ-killing (antiseptic) solution that is used to clean the skin. It can get rid of the bacteria that normally live on the skin and can keep them away for about 24 hours. To clean your skin with CHG, you may be given: ?A CHG solution to use in the shower or as part of a sponge bath. ?A prepackaged cloth that contains CHG. ?Cleaning your skin with CHG may help lower the risk for infection: ?While you are staying in the intensive care unit of the hospital. ?If you have a vascular access, such as a central line, to provide short-term or long-term access to your  veins. ?If you have a catheter to drain urine from your bladder. ?If you are on a ventilator. A ventilator is a machine that helps you breathe by moving air in and out of your lungs. ?After surgery. ?What are the risks? ?Risks of using CHG include: ?A skin reaction. ?Hearing loss, if CHG gets in your ears and you have a perforated eardrum. ?Eye injury, if CHG gets in your eyes and is not rinsed out. ?The CHG product catching fire. ?Make sure that you avoid smoking and flames after applying CHG to your skin. ?Do not use CHG: ?If you have a chlorhexidine allergy or have previously reacted to chlorhexidine. ?On babies younger than 56 months of age. ?How to use CHG solution ?Use CHG only as told by your health care provider, and follow the instructions on the label. ?Use the full amount of CHG as directed. Usually, this is one bottle. ?During a shower ?Follow these steps when using CHG solution during a shower (unless your health care provider gives you different instructions): ?Start the shower. ?Use your normal soap and shampoo to wash your face and hair. ?Turn off the shower or move out of the shower stream. ?Pour the CHG onto a clean washcloth. Do not use any type of brush or rough-edged sponge. ?  Starting at your neck, lather your body down to your toes. Make sure you follow these instructions: ?If you will be having surgery, pay special attention to the part of your body where you will be having surgery. Scrub this area for at least 1 minute. ?Do not use CHG on your head or face. If the solution gets into your ears or eyes, rinse them well with water. ?Avoid your genital area. ?Avoid any areas of skin that have broken skin, cuts, or scrapes. ?Scrub your back and under your arms. Make sure to wash skin folds. ?Let the lather sit on your skin for 1-2 minutes or as long as told by your health care provider. ?Thoroughly rinse your entire body in the shower. Make sure that all body creases and crevices are rinsed  well. ?Dry off with a clean towel. Do not put any substances on your body afterward--such as powder, lotion, or perfume--unless you are told to do so by your health care provider. Only use lotions that are recommended by the manufacturer. ?Put on clean clothes or pajamas. ?If it is the night before your surgery, sleep in clean sheets. ? ?During a sponge bath ?Follow these steps when using CHG solution during a sponge bath (unless your health care provider gives you different instructions): ?Use your normal soap and shampoo to wash your face and hair. ?Pour the CHG onto a clean washcloth. ?Starting at your neck, lather your body down to your toes. Make sure you follow these instructions: ?If you will be having surgery, pay special attention to the part of your body where you will be having surgery. Scrub this area for at least 1 minute. ?Do not use CHG on your head or face. If the solution gets into your ears or eyes, rinse them well with water. ?Avoid your genital area. ?Avoid any areas of skin that have broken skin, cuts, or scrapes. ?Scrub your back and under your arms. Make sure to wash skin folds. ?Let the lather sit on your skin for 1-2 minutes or as long as told by your health care provider. ?Using a different clean, wet washcloth, thoroughly rinse your entire body. Make sure that all body creases and crevices are rinsed well. ?Dry off with a clean towel. Do not put any substances on your body afterward--such as powder, lotion, or perfume--unless you are told to do so by your health care provider. Only use lotions that are recommended by the manufacturer. ?Put on clean clothes or pajamas. ?If it is the night before your surgery, sleep in clean sheets. ?How to use CHG prepackaged cloths ?Only use CHG cloths as told by your health care provider, and follow the instructions on the label. ?Use the CHG cloth on clean, dry skin. ?Do not use the CHG cloth on your head or face unless your health care provider tells  you to. ?When washing with the CHG cloth: ?Avoid your genital area. ?Avoid any areas of skin that have broken skin, cuts, or scrapes. ?Before surgery ?Follow these steps when using a CHG cloth to clean before surgery (unless your health care provider gives you different instructions): ?Using the CHG cloth, vigorously scrub the part of your body where you will be having surgery. Scrub using a back-and-forth motion for 3 minutes. The area on your body should be completely wet with CHG when you are done scrubbing. ?Do not rinse. Discard the cloth and let the area air-dry. Do not put any substances on the area afterward, such as powder, lotion,  or perfume. ?Put on clean clothes or pajamas. ?If it is the night before your surgery, sleep in clean sheets. ? ?For general bathing ?Follow these steps when using CHG cloths for general bathing (unless your health care provider gives you different instructions). ?Use a separate CHG cloth for each area of your body. Make sure you wash between any folds of skin and between your fingers and toes. Wash your body in the following order, switching to a new cloth after each step: ?The front of your neck, shoulders, and chest. ?Both of your arms, under your arms, and your hands. ?Your stomach and groin area, avoiding the genitals. ?Your right leg and foot. ?Your left leg and foot. ?The back of your neck, your back, and your buttocks. ?Do not rinse. Discard the cloth and let the area air-dry. Do not put any substances on your body afterward--such as powder, lotion, or perfume--unless you are told to do so by your health care provider. Only use lotions that are recommended by the manufacturer. ?Put on clean clothes or pajamas. ?Contact a health care provider if: ?Your skin gets irritated after scrubbing. ?You have questions about using your solution or cloth. ?You swallow any chlorhexidine. Call your local poison control center (1-8171332631 in the [place] ?Get help right away if: ?Your  eyes itch badly, or they become very red or swollen. ?Your skin itches badly and is red or swollen. ?Your hearing changes. ?You have trouble seeing. ?You have swelling or tingling in your mouth or throat. ?You ha

## 2022-03-24 ENCOUNTER — Encounter (HOSPITAL_COMMUNITY)
Admission: RE | Admit: 2022-03-24 | Discharge: 2022-03-24 | Disposition: A | Discharge status: Home / Self Care | Payer: BC Managed Care – PPO | Source: Ambulatory Visit | Attending: Obstetrics & Gynecology | Admitting: Obstetrics & Gynecology

## 2022-03-24 ENCOUNTER — Other Ambulatory Visit: Payer: Self-pay | Admitting: Obstetrics & Gynecology

## 2022-03-24 DIAGNOSIS — Z87891 Personal history of nicotine dependence: Secondary | ICD-10-CM | POA: Diagnosis not present

## 2022-03-24 DIAGNOSIS — Z6841 Body Mass Index (BMI) 40.0 and over, adult: Secondary | ICD-10-CM | POA: Diagnosis not present

## 2022-03-24 DIAGNOSIS — I10 Essential (primary) hypertension: Secondary | ICD-10-CM | POA: Diagnosis not present

## 2022-03-24 DIAGNOSIS — Z01818 Encounter for other preprocedural examination: Secondary | ICD-10-CM | POA: Insufficient documentation

## 2022-03-24 DIAGNOSIS — N946 Dysmenorrhea, unspecified: Secondary | ICD-10-CM | POA: Diagnosis not present

## 2022-03-24 DIAGNOSIS — N920 Excessive and frequent menstruation with regular cycle: Secondary | ICD-10-CM | POA: Diagnosis not present

## 2022-03-24 DIAGNOSIS — Z114 Encounter for screening for human immunodeficiency virus [HIV]: Secondary | ICD-10-CM | POA: Insufficient documentation

## 2022-03-24 DIAGNOSIS — Z302 Encounter for sterilization: Secondary | ICD-10-CM | POA: Diagnosis not present

## 2022-03-24 LAB — CBC
HCT: 40.1 % (ref 36.0–46.0)
Hemoglobin: 13.2 g/dL (ref 12.0–15.0)
MCH: 31.5 pg (ref 26.0–34.0)
MCHC: 32.9 g/dL (ref 30.0–36.0)
MCV: 95.7 fL (ref 80.0–100.0)
Platelets: 264 10*3/uL (ref 150–400)
RBC: 4.19 MIL/uL (ref 3.87–5.11)
RDW: 12.5 % (ref 11.5–15.5)
WBC: 8.9 10*3/uL (ref 4.0–10.5)
nRBC: 0 % (ref 0.0–0.2)

## 2022-03-24 LAB — URINALYSIS, ROUTINE W REFLEX MICROSCOPIC
Bilirubin Urine: NEGATIVE
Glucose, UA: NEGATIVE mg/dL
Hgb urine dipstick: NEGATIVE
Ketones, ur: NEGATIVE mg/dL
Leukocytes,Ua: NEGATIVE
Nitrite: NEGATIVE
Protein, ur: NEGATIVE mg/dL
Specific Gravity, Urine: 1.019 (ref 1.005–1.030)
pH: 6 (ref 5.0–8.0)

## 2022-03-24 LAB — COMPREHENSIVE METABOLIC PANEL
ALT: 100 U/L — ABNORMAL HIGH (ref 0–44)
AST: 62 U/L — ABNORMAL HIGH (ref 15–41)
Albumin: 4.1 g/dL (ref 3.5–5.0)
Alkaline Phosphatase: 62 U/L (ref 38–126)
Anion gap: 6 (ref 5–15)
BUN: 10 mg/dL (ref 6–20)
CO2: 24 mmol/L (ref 22–32)
Calcium: 9.2 mg/dL (ref 8.9–10.3)
Chloride: 106 mmol/L (ref 98–111)
Creatinine, Ser: 0.69 mg/dL (ref 0.44–1.00)
GFR, Estimated: >60 mL/min (ref >60)
Glucose, Bld: 129 mg/dL — ABNORMAL HIGH (ref 70–99)
Potassium: 4.1 mmol/L (ref 3.5–5.1)
Sodium: 136 mmol/L (ref 135–145)
Total Bilirubin: 0.4 mg/dL (ref 0.3–1.2)
Total Protein: 7.7 g/dL (ref 6.5–8.1)

## 2022-03-24 LAB — RAPID HIV SCREEN (HIV 1/2 AB+AG)
HIV 1/2 Antibodies: NONREACTIVE
HIV-1 P24 Antigen - HIV24: NONREACTIVE

## 2022-03-24 LAB — HCG, QUANTITATIVE, PREGNANCY: hCG, Beta Chain, Quant, S: <1 m[IU]/mL (ref <5)

## 2022-03-26 ENCOUNTER — Ambulatory Visit (HOSPITAL_COMMUNITY)
Admission: RE | Admit: 2022-03-26 | Discharge: 2022-03-26 | Disposition: A | Discharge status: Home / Self Care | Payer: BC Managed Care – PPO | Attending: Obstetrics & Gynecology | Admitting: Obstetrics & Gynecology

## 2022-03-26 ENCOUNTER — Encounter (HOSPITAL_COMMUNITY)
Admission: RE | Disposition: A | Discharge status: Home / Self Care | Payer: Self-pay | Source: Home / Self Care | Attending: Obstetrics & Gynecology

## 2022-03-26 ENCOUNTER — Encounter (HOSPITAL_COMMUNITY): Payer: Self-pay | Admitting: Obstetrics & Gynecology

## 2022-03-26 ENCOUNTER — Ambulatory Visit (HOSPITAL_COMMUNITY): Payer: BC Managed Care – PPO | Admitting: Certified Registered Nurse Anesthetist

## 2022-03-26 ENCOUNTER — Other Ambulatory Visit: Payer: Self-pay

## 2022-03-26 DIAGNOSIS — I10 Essential (primary) hypertension: Secondary | ICD-10-CM | POA: Insufficient documentation

## 2022-03-26 DIAGNOSIS — N920 Excessive and frequent menstruation with regular cycle: Secondary | ICD-10-CM | POA: Diagnosis not present

## 2022-03-26 DIAGNOSIS — Z302 Encounter for sterilization: Secondary | ICD-10-CM | POA: Diagnosis not present

## 2022-03-26 DIAGNOSIS — N946 Dysmenorrhea, unspecified: Secondary | ICD-10-CM | POA: Insufficient documentation

## 2022-03-26 DIAGNOSIS — Z6841 Body Mass Index (BMI) 40.0 and over, adult: Secondary | ICD-10-CM | POA: Insufficient documentation

## 2022-03-26 DIAGNOSIS — Z87891 Personal history of nicotine dependence: Secondary | ICD-10-CM | POA: Insufficient documentation

## 2022-03-26 DIAGNOSIS — Z114 Encounter for screening for human immunodeficiency virus [HIV]: Secondary | ICD-10-CM | POA: Insufficient documentation

## 2022-03-26 HISTORY — PX: HYSTEROSCOPY: SHX211

## 2022-03-26 HISTORY — PX: LAPAROSCOPIC BILATERAL SALPINGECTOMY: SHX5889

## 2022-03-26 SURGERY — ABLATION, ENDOMETRIUM, HYSTEROSCOPIC
Anesthesia: General | Site: Vagina

## 2022-03-26 MED ORDER — ONDANSETRON HCL 4 MG/2ML IJ SOLN: 4.0000 mg | Freq: Once | INTRAMUSCULAR | Status: DC | PRN

## 2022-03-26 MED ORDER — FENTANYL CITRATE (PF) 100 MCG/2ML IJ SOLN
INTRAMUSCULAR | Status: DC | PRN
  Administered 2022-03-26 (×3): 50 ug via INTRAVENOUS
  Administered 2022-03-26: 100 ug via INTRAVENOUS
  Administered 2022-03-26: 50 ug via INTRAVENOUS

## 2022-03-26 MED ORDER — ROCURONIUM BROMIDE 10 MG/ML (PF) SYRINGE
PREFILLED_SYRINGE | INTRAVENOUS | Status: DC | PRN
  Administered 2022-03-26: 40 mg via INTRAVENOUS
  Administered 2022-03-26: 10 mg via INTRAVENOUS

## 2022-03-26 MED ORDER — FENTANYL CITRATE (PF) 100 MCG/2ML IJ SOLN
INTRAMUSCULAR | Status: AC
  Filled 2022-03-26: qty 2

## 2022-03-26 MED ORDER — BUPIVACAINE LIPOSOME 1.3 % IJ SUSP
INTRAMUSCULAR | Status: AC
  Filled 2022-03-26: qty 20

## 2022-03-26 MED ORDER — DEXAMETHASONE SODIUM PHOSPHATE 10 MG/ML IJ SOLN
INTRAMUSCULAR | Status: DC | PRN
  Administered 2022-03-26: 10 mg via INTRAVENOUS

## 2022-03-26 MED ORDER — HYDROCODONE-ACETAMINOPHEN 5-325 MG PO TABS
1.0000 | ORAL_TABLET | Freq: Four times a day (QID) | ORAL | 0 refills | Status: DC | PRN
Start: 2022-03-26 — End: 2024-07-27

## 2022-03-26 MED ORDER — CEFAZOLIN SODIUM-DEXTROSE 2-4 GM/100ML-% IV SOLN
2.0000 g | INTRAVENOUS | Status: AC
  Administered 2022-03-26: 2 g via INTRAVENOUS
  Filled 2022-03-26: qty 100

## 2022-03-26 MED ORDER — ONDANSETRON HCL 4 MG/2ML IJ SOLN
INTRAMUSCULAR | Status: DC | PRN
  Administered 2022-03-26: 4 mg via INTRAVENOUS

## 2022-03-26 MED ORDER — SUCCINYLCHOLINE CHLORIDE 200 MG/10ML IV SOSY
PREFILLED_SYRINGE | INTRAVENOUS | Status: DC | PRN
  Administered 2022-03-26: 160 mg via INTRAVENOUS

## 2022-03-26 MED ORDER — LACTATED RINGERS IV SOLN: INTRAVENOUS | Status: DC | PRN

## 2022-03-26 MED ORDER — KETOROLAC TROMETHAMINE 30 MG/ML IJ SOLN
30.0000 mg | Freq: Once | INTRAMUSCULAR | Status: AC
  Administered 2022-03-26: 30 mg via INTRAVENOUS
  Filled 2022-03-26: qty 1

## 2022-03-26 MED ORDER — PROPOFOL 10 MG/ML IV BOLUS
INTRAVENOUS | Status: AC
  Filled 2022-03-26: qty 20

## 2022-03-26 MED ORDER — LIDOCAINE HCL (PF) 2 % IJ SOLN
INTRAMUSCULAR | Status: AC
  Filled 2022-03-26: qty 5

## 2022-03-26 MED ORDER — MIDAZOLAM HCL 2 MG/2ML IJ SOLN
INTRAMUSCULAR | Status: DC | PRN
  Administered 2022-03-26: 2 mg via INTRAVENOUS

## 2022-03-26 MED ORDER — FENTANYL CITRATE PF 50 MCG/ML IJ SOSY
25.0000 ug | PREFILLED_SYRINGE | INTRAMUSCULAR | Status: DC | PRN
  Administered 2022-03-26: 50 ug via INTRAVENOUS
  Filled 2022-03-26: qty 1

## 2022-03-26 MED ORDER — SCOPOLAMINE 1 MG/3DAYS TD PT72
MEDICATED_PATCH | TRANSDERMAL | Status: AC
  Filled 2022-03-26: qty 1

## 2022-03-26 MED ORDER — KETOROLAC TROMETHAMINE 10 MG PO TABS: 10.0000 mg | ORAL_TABLET | Freq: Three times a day (TID) | ORAL | 0 refills | Status: DC | PRN

## 2022-03-26 MED ORDER — BUPIVACAINE LIPOSOME 1.3 % IJ SUSP
20.0000 mL | Freq: Once | INTRAMUSCULAR | Status: DC
  Filled 2022-03-26: qty 20

## 2022-03-26 MED ORDER — DEXAMETHASONE SODIUM PHOSPHATE 10 MG/ML IJ SOLN
INTRAMUSCULAR | Status: AC
  Filled 2022-03-26: qty 1

## 2022-03-26 MED ORDER — SUGAMMADEX SODIUM 500 MG/5ML IV SOLN
INTRAVENOUS | Status: DC | PRN
  Administered 2022-03-26: 300 mg via INTRAVENOUS

## 2022-03-26 MED ORDER — MIDAZOLAM HCL 2 MG/2ML IJ SOLN
INTRAMUSCULAR | Status: AC
  Filled 2022-03-26: qty 2

## 2022-03-26 MED ORDER — PROPOFOL 10 MG/ML IV BOLUS
INTRAVENOUS | Status: DC | PRN
  Administered 2022-03-26: 10 mg via INTRAVENOUS
  Administered 2022-03-26: 200 mg via INTRAVENOUS

## 2022-03-26 MED ORDER — SODIUM CHLORIDE 0.9 % IR SOLN
Status: DC | PRN
Start: 2022-03-26 — End: 2022-03-26
  Administered 2022-03-26: 3000 mL

## 2022-03-26 MED ORDER — LIDOCAINE HCL (CARDIAC) PF 100 MG/5ML IV SOSY
PREFILLED_SYRINGE | INTRAVENOUS | Status: DC | PRN
  Administered 2022-03-26: 60 mg via INTRATRACHEAL

## 2022-03-26 MED ORDER — SCOPOLAMINE 1 MG/3DAYS TD PT72
1.0000 | MEDICATED_PATCH | TRANSDERMAL | Status: DC
  Administered 2022-03-26: 1.5 mg via TRANSDERMAL

## 2022-03-26 MED ORDER — ONDANSETRON HCL 4 MG/2ML IJ SOLN
INTRAMUSCULAR | Status: AC
  Filled 2022-03-26: qty 2

## 2022-03-26 MED ORDER — ROCURONIUM BROMIDE 10 MG/ML (PF) SYRINGE
PREFILLED_SYRINGE | INTRAVENOUS | Status: AC
  Filled 2022-03-26: qty 10

## 2022-03-26 MED ORDER — POVIDONE-IODINE 10 % EX SWAB: 2.0000 "application " | Freq: Once | CUTANEOUS | Status: DC

## 2022-03-26 MED ORDER — ONDANSETRON 8 MG PO TBDP: 8.0000 mg | ORAL_TABLET | Freq: Three times a day (TID) | ORAL | 0 refills | Status: DC | PRN

## 2022-03-26 MED ORDER — SUCCINYLCHOLINE CHLORIDE 200 MG/10ML IV SOSY
PREFILLED_SYRINGE | INTRAVENOUS | Status: AC
  Filled 2022-03-26: qty 10

## 2022-03-26 MED ORDER — 0.9 % SODIUM CHLORIDE (POUR BTL) OPTIME
TOPICAL | Status: DC | PRN
  Administered 2022-03-26: 1000 mL

## 2022-03-26 MED ORDER — FENTANYL CITRATE (PF) 250 MCG/5ML IJ SOLN
INTRAMUSCULAR | Status: AC
  Filled 2022-03-26: qty 5

## 2022-03-26 MED ORDER — BUPIVACAINE LIPOSOME 1.3 % IJ SUSP
INTRAMUSCULAR | Status: DC | PRN
  Administered 2022-03-26: 20 mL

## 2022-03-26 SURGICAL SUPPLY — 55 items
APPLICATOR COTTON TIP 6 STRL (MISCELLANEOUS) ×1 IMPLANT
APPLICATOR COTTON TIP 6IN STRL (MISCELLANEOUS) ×4
BAG HAMPER (MISCELLANEOUS) ×4 IMPLANT
BLADE SURG SZ11 CARB STEEL (BLADE) ×4 IMPLANT
CLOTH BEACON ORANGE TIMEOUT ST (SAFETY) ×4 IMPLANT
COVER LIGHT HANDLE STERIS (MISCELLANEOUS) ×8 IMPLANT
COVER MAYO STAND XLG (MISCELLANEOUS) ×2 IMPLANT
DERMABOND ADVANCED (GAUZE/BANDAGES/DRESSINGS) ×1
DERMABOND ADVANCED .7 DNX12 (GAUZE/BANDAGES/DRESSINGS) ×3 IMPLANT
DRAPE HALF SHEET 40X57 (DRAPES) ×4 IMPLANT
ELECT REM PT RETURN 9FT ADLT (ELECTROSURGICAL) ×4
ELECTRODE REM PT RTRN 9FT ADLT (ELECTROSURGICAL) ×3 IMPLANT
GAUZE 4X4 16PLY ~~LOC~~+RFID DBL (SPONGE) ×8 IMPLANT
GLOVE ECLIPSE 8.0 STRL XLNG CF (GLOVE) ×4 IMPLANT
GLOVE SRG 8 PF TXTR STRL LF DI (GLOVE) ×3 IMPLANT
GLOVE SURG LTX SZ6.5 (GLOVE) ×2 IMPLANT
GLOVE SURG POLYISO LF SZ8 (GLOVE) ×2 IMPLANT
GLOVE SURG UNDER POLY LF SZ7 (GLOVE) ×12 IMPLANT
GLOVE SURG UNDER POLY LF SZ8 (GLOVE) ×1
GOWN STRL REUS W/TWL LRG LVL3 (GOWN DISPOSABLE) ×4 IMPLANT
GOWN STRL REUS W/TWL XL LVL3 (GOWN DISPOSABLE) ×4 IMPLANT
HANDPIECE ABLA MINERVA ENDO (MISCELLANEOUS) ×4 IMPLANT
INST SET HYSTEROSCOPY (KITS) ×4 IMPLANT
INST SET LAPROSCOPIC GYN AP (KITS) ×4 IMPLANT
IV NS 1000ML (IV SOLUTION) ×1
IV NS 1000ML BAXH (IV SOLUTION) ×3 IMPLANT
KIT TURNOVER CYSTO (KITS) ×4 IMPLANT
MANIFOLD NEPTUNE II (INSTRUMENTS) ×4 IMPLANT
MARKER SKIN DUAL TIP RULER LAB (MISCELLANEOUS) ×4 IMPLANT
NDL HYPO 21X1.5 SAFETY (NEEDLE) ×2 IMPLANT
NDL INSUFFLATION 14GA 120MM (NEEDLE) ×2 IMPLANT
NEEDLE HYPO 21X1.5 SAFETY (NEEDLE) ×4 IMPLANT
NEEDLE INSUFFLATION 14GA 120MM (NEEDLE) ×4 IMPLANT
NS IRRIG 1000ML POUR BTL (IV SOLUTION) ×4 IMPLANT
PACK BASIC III (CUSTOM PROCEDURE TRAY) ×1
PACK PERI GYN (CUSTOM PROCEDURE TRAY) ×4 IMPLANT
PACK SRG BSC III STRL LF ECLPS (CUSTOM PROCEDURE TRAY) ×3 IMPLANT
PAD ARMBOARD 7.5X6 YLW CONV (MISCELLANEOUS) ×4 IMPLANT
PAD TELFA 3X4 1S STER (GAUZE/BANDAGES/DRESSINGS) ×4 IMPLANT
SET BASIN LINEN APH (SET/KITS/TRAYS/PACK) ×4 IMPLANT
SET CYSTO W/LG BORE CLAMP LF (SET/KITS/TRAYS/PACK) ×4 IMPLANT
SET TUBE SMOKE EVAC HIGH FLOW (TUBING) ×4 IMPLANT
SHEARS HARMONIC ACE PLUS 36CM (ENDOMECHANICALS) ×4 IMPLANT
SHEET LAVH (DRAPES) ×4 IMPLANT
SLEEVE Z-THREAD 5X100MM (TROCAR) ×2 IMPLANT
SOL ANTI FOG 6CC (MISCELLANEOUS) ×3 IMPLANT
SOLUTION ANTI FOG 6CC (MISCELLANEOUS) ×1
SUT VICRYL 0 UR6 27IN ABS (SUTURE) ×4 IMPLANT
SUT VICRYL AB 3-0 FS1 BRD 27IN (SUTURE) ×6 IMPLANT
SYR 10ML LL (SYRINGE) ×4 IMPLANT
SYR 20ML LL LF (SYRINGE) ×6 IMPLANT
TROCAR Z-THRD FIOS HNDL 11X100 (TROCAR) ×2 IMPLANT
TROCAR Z-THREAD FIOS 5X100MM (TROCAR) ×2 IMPLANT
TUBE CONNECTING 12X1/4 (SUCTIONS) ×4 IMPLANT
WARMER LAPAROSCOPE (MISCELLANEOUS) ×4 IMPLANT

## 2022-03-26 NOTE — Anesthesia Procedure Notes (Signed)
Procedure Name: Intubation ?Date/Time: 03/26/2022 7:40 AM ?Performed by: Karna Dupes, CRNA ?Pre-anesthesia Checklist: Patient identified, Emergency Drugs available, Suction available and Patient being monitored ?Patient Re-evaluated:Patient Re-evaluated prior to induction ?Oxygen Delivery Method: Circle system utilized ?Preoxygenation: Pre-oxygenation with 100% oxygen ?Induction Type: IV induction ?Ventilation: Mask ventilation without difficulty ?Laryngoscope Size: Mac and 3 ?Grade View: Grade II ?Tube type: Oral ?Tube size: 7.0 mm ?Number of attempts: 1 ?Airway Equipment and Method: Stylet ?Placement Confirmation: ETT inserted through vocal cords under direct vision, positive ETCO2 and breath sounds checked- equal and bilateral ?Secured at: 21 cm ?Tube secured with: Tape ?Dental Injury: Teeth and Oropharynx as per pre-operative assessment  ? ? ? ? ?

## 2022-03-26 NOTE — Anesthesia Preprocedure Evaluation (Signed)
Anesthesia Evaluation  ?Patient identified by MRN, date of birth, ID band ?Patient awake ? ? ? ?Reviewed: ?Allergy & Precautions, H&P , NPO status , Patient's Chart, lab work & pertinent test results, reviewed documented beta blocker date and time  ? ?Airway ?Mallampati: II ? ?TM Distance: >3 FB ?Neck ROM: full ? ? ? Dental ?no notable dental hx. ? ?  ?Pulmonary ?neg pulmonary ROS, former smoker,  ?  ?Pulmonary exam normal ?breath sounds clear to auscultation ? ? ? ? ? ? Cardiovascular ?Exercise Tolerance: Good ?hypertension, negative cardio ROS ? ? ?Rhythm:regular Rate:Normal ? ? ?  ?Neuro/Psych ?PSYCHIATRIC DISORDERS Depression  Neuromuscular disease   ? GI/Hepatic ?negative GI ROS, Neg liver ROS,   ?Endo/Other  ?diabetes, Type obesity ? Renal/GU ?negative Renal ROS  ?negative genitourinary ?  ?Musculoskeletal ? ? Abdominal ?  ?Peds ? Hematology ? ?(+) Blood dyscrasia, anemia ,   ?Anesthesia Other Findings ? ? Reproductive/Obstetrics ?negative OB ROS ? ?  ? ? ? ? ? ? ? ? ? ? ? ? ? ?  ?  ? ? ? ? ? ? ? ? ?Anesthesia Physical ?Anesthesia Plan ? ?ASA: 3 ? ?Anesthesia Plan: General and General ETT  ? ?Post-op Pain Management:   ? ?Induction:  ? ?PONV Risk Score and Plan: Ondansetron ? ?Airway Management Planned:  ? ?Additional Equipment:  ? ?Intra-op Plan:  ? ?Post-operative Plan:  ? ?Informed Consent: I have reviewed the patients History and Physical, chart, labs and discussed the procedure including the risks, benefits and alternatives for the proposed anesthesia with the patient or authorized representative who has indicated his/her understanding and acceptance.  ? ? ? ?Dental Advisory Given ? ?Plan Discussed with: CRNA ? ?Anesthesia Plan Comments:   ? ? ? ? ? ? ?Anesthesia Quick Evaluation ? ?

## 2022-03-26 NOTE — Anesthesia Postprocedure Evaluation (Signed)
Anesthesia Post Note ? ?Patient: Katelyn Avila ? ?Procedure(s) Performed: HYSTEROSCOPY WITH MINERVA (Vagina ) ?LAPAROSCOPIC BILATERAL SALPINGECTOMY (Bilateral: Abdomen) ? ?Patient location during evaluation: Phase II ?Anesthesia Type: General ?Level of consciousness: awake ?Pain management: pain level controlled ?Vital Signs Assessment: post-procedure vital signs reviewed and stable ?Respiratory status: spontaneous breathing and respiratory function stable ?Cardiovascular status: blood pressure returned to baseline and stable ?Postop Assessment: no headache and no apparent nausea or vomiting ?Anesthetic complications: no ?Comments: Late entry ? ? ?No notable events documented. ? ? ?Last Vitals:  ?Vitals:  ? 03/26/22 0918 03/26/22 0930  ?BP: (!) 150/106 (!) 136/99  ?Pulse: (!) 114 (!) 104  ?Resp: 13 13  ?Temp: 36.8 ?C   ?SpO2: 93% 95%  ?  ?Last Pain:  ?Vitals:  ? 03/26/22 0930  ?TempSrc:   ?PainSc: Asleep  ? ? ?  ?  ?  ?  ?  ?  ? ?[place] ? ? ? ? ?

## 2022-03-26 NOTE — H&P (Signed)
?Preoperative History and Physical ? ?Katelyn Avila is a 36 y.o. [id number] with No LMP recorded. (Menstrual status: Irregular Periods). admitted for a hysteroscopy uterine curettage Minerva endometrial ablation and laparoscopic bilateral salpingectomy for sterilization.  ? ?Menorrhagia dysmenorrhea with normal sonogram, no dyspareunia, declines IUD and other BCM ? ?Desires sterilization as well ? ? ?PMH:    ?Past Medical History:  ?Diagnosis Date  ? 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 ADD (attention deficit disorder)   ? Anemia   ? Carpal tunnel syndrome, bilateral   ? Cervical high risk HPV (human papillomavirus) test positive 07/25/2015  ? Complication of anesthesia   ? states woke up during T & A  ? Gestational diabetes   ? Hidradenitis 07/23/2015  ? Hypertension   ? under control "most of the time"; has been on med. since 09/2014  ? Neuromuscular disorder (HCC)   ? Pain with urination 07/23/2015  ? PCOS (polycystic ovarian syndrome)   ? Seasonal allergies   ? ? ?PSH:     ?Past Surgical History:  ?Procedure Laterality Date  ? CARPAL TUNNEL RELEASE Right 02/22/2015  ? Procedure: RIGHT CARPAL TUNNEL RELEASE;  Surgeon: Betha LoaKevin Kuzma, MD;  Location: [facility];  Service: Orthopedics;  Laterality: Right;  ? CESAREAN SECTION    ? CESAREAN SECTION N/A 03/12/2017  ? Procedure: CESAREAN SECTION;  Surgeon: Catalina AntiguaPeggy Constant, MD;  Location: [facility];  Service: Obstetrics;  Laterality: N/A;  ? TONSILLECTOMY AND ADENOIDECTOMY    ? WISDOM TOOTH EXTRACTION    ? ? ?POb/GynH:      ?OB History   ? ? Gravida  ?2  ? Para  ?2  ? Term  ?1  ? Preterm  ?1  ? AB  ?   ? Living  ?2  ?  ? ? SAB  ?   ? IAB  ?   ? Ectopic  ?   ? Multiple  ?   ? Live Births  ?2  ?   ?  ?  ? ? ?SH:   ?Social History  ? ?Tobacco Use  ? Smoking status: Former  ?  Packs/day: 0.50  ?  Years: 13.00  ?  Pack years: 6.50  ?  Types: Cigarettes  ?  Quit date: 07/11/2016  ?  Years since quitting: 5.7  ? Smokeless tobacco: Never  ?Vaping Use  ? Vaping Use: Some days   ?Substance Use Topics  ? Alcohol use: No  ?  Comment: 1-2 x/week and weekends-before pregnancy  ? Drug use: No  ? ? ?FH:    ?Family History  ?Problem Relation Age of Onset  ? Fibroids Mother   ? Hypertension Mother   ? Endometriosis Mother   ? Alcohol abuse Mother   ? Drug abuse Mother   ? Hypertension Father   ? Alcohol abuse Father   ? Hypertension Brother   ? Drug abuse Brother   ? Hypertension Maternal Grandmother   ? Diabetes Maternal Grandmother   ? COPD Maternal Grandmother   ? Hypertension Maternal Grandfather   ? Hypertension Paternal Grandmother   ? Cancer Paternal Grandmother   ?     breast  ? Hypertension Paternal Grandfather   ? ? ? ?Allergies: No Known Allergies ? ?Medications:       ?Current Facility-Administered Medications:  ?  bupivacaine liposome (EXPAREL) 1.3 % injection 266 mg, 20 mL, Infiltration, Once, Ivonne Freeburg, Amaryllis DykeLuther H, MD ?  ceFAZolin (ANCEF) IVPB 2g/100 mL premix, 2 g, Intravenous, On Call to OR, Shanira Tine,  Amaryllis Dyke, MD ?  povidone-iodine 10 % swab 2 application., 2 application., Topical, Once, Lazaro Arms, MD ?  scopolamine (TRANSDERM-SCOP) 1 MG/3DAYS 1.5 mg, 1 patch, Transdermal, Q72H, Kiel, Mosetta Putt, MD, 1.5 mg at 03/26/22 0710 ?  scopolamine (TRANSDERM-SCOP) 1 MG/3DAYS, , , ,  ? ?Review of Systems:  ? ?Review of Systems  ?Constitutional: Negative for fever, chills, weight loss, malaise/fatigue and diaphoresis.  ?HENT: Negative for hearing loss, ear pain, nosebleeds, congestion, sore throat, neck pain, tinnitus and ear discharge.   ?Eyes: Negative for blurred vision, double vision, photophobia, pain, discharge and redness.  ?Respiratory: Negative for cough, hemoptysis, sputum production, shortness of breath, wheezing and stridor.   ?Cardiovascular: Negative for chest pain, palpitations, orthopnea, claudication, leg swelling and PND.  ?Gastrointestinal: Positive for abdominal pain. Negative for heartburn, nausea, vomiting, diarrhea, constipation, blood in stool and melena.  ?Genitourinary:  Negative for dysuria, urgency, frequency, hematuria and flank pain.  ?Musculoskeletal: Negative for myalgias, back pain, joint pain and falls.  ?Skin: Negative for itching and rash.  ?Neurological: Negative for dizziness, tingling, tremors, sensory change, speech change, focal weakness, seizures, loss of consciousness, weakness and headaches.  ?Endo/Heme/Allergies: Negative for environmental allergies and polydipsia. Does not bruise/bleed easily.  ?Psychiatric/Behavioral: Negative for depression, suicidal ideas, hallucinations, memory loss and substance abuse. The patient is not nervous/anxious and does not have insomnia.   ? ? ? ?PHYSICAL EXAM: ? ?Blood pressure (!) 143/97, pulse (!) 107, temperature 98.4 ?F (36.9 ?C), temperature source Oral, resp. rate 18, SpO2 98 %. ? ?  ?Vitals reviewed. ?Constitutional: She is oriented to person, place, and time. She appears well-developed and well-nourished.  ?HENT:  ?Head: Normocephalic and atraumatic.  ?Right Ear: External ear normal.  ?Left Ear: External ear normal.  ?Nose: Nose normal.  ?Mouth/Throat: Oropharynx is clear and moist.  ?Eyes: Conjunctivae and EOM are normal. Pupils are equal, round, and reactive to light. Right eye exhibits no discharge. Left eye exhibits no discharge. No scleral icterus.  ?Neck: Normal range of motion. Neck supple. No tracheal deviation present. No thyromegaly present.  ?Cardiovascular: Normal rate, regular rhythm, normal heart sounds and intact distal pulses.  Exam reveals no gallop and no friction rub.   ?No murmur heard. ?Respiratory: Effort normal and breath sounds normal. No respiratory distress. She has no wheezes. She has no rales. She exhibits no tenderness.  ?GI: Soft. Bowel sounds are normal. She exhibits no distension and no mass. There is tenderness. There is no rebound and no guarding.  ?Genitourinary:  ?     Vulva is normal without lesions ?Vagina is pink moist without discharge ?Cervix normal in appearance and pap is  normal ?Uterus is normal size, contour, position, consistency, mobility, non-tender ?Adnexa is negative with normal sized ovaries by sonogram  ?Musculoskeletal: Normal range of motion. She exhibits no edema and no tenderness.  ?Neurological: She is alert and oriented to person, place, and time. She has normal reflexes. She displays normal reflexes. No cranial nerve deficit. She exhibits normal muscle tone. Coordination normal.  ?Skin: Skin is warm and dry. No rash noted. No erythema. No pallor.  ?Psychiatric: She has a normal mood and affect. Her behavior is normal. Judgment and thought content normal.  ? ? ?Labs: ?Results for orders placed or performed during the hospital encounter of 03/24/22 (from the past 336 hour(s))  ?CBC  ? Collection Time: 03/24/22  2:37 PM  ?Result Value Ref Range  ? WBC 8.9 4.0 - 10.5 K/uL  ? RBC 4.19 3.87 - 5.11  MIL/uL  ? Hemoglobin 13.2 12.0 - 15.0 g/dL  ? HCT 40.1 36.0 - 46.0 %  ? MCV 95.7 80.0 - 100.0 fL  ? MCH 31.5 26.0 - 34.0 pg  ? MCHC 32.9 30.0 - 36.0 g/dL  ? RDW 12.5 11.5 - 15.5 %  ? Platelets 264 150 - 400 K/uL  ? nRBC 0.0 0.0 - 0.2 %  ?Comprehensive metabolic panel  ? Collection Time: 03/24/22  2:37 PM  ?Result Value Ref Range  ? Sodium 136 135 - 145 mmol/L  ? Potassium 4.1 3.5 - 5.1 mmol/L  ? Chloride 106 98 - 111 mmol/L  ? CO2 24 22 - 32 mmol/L  ? Glucose, Bld 129 (H) 70 - 99 mg/dL  ? BUN 10 6 - 20 mg/dL  ? Creatinine, Ser 0.69 0.44 - 1.00 mg/dL  ? Calcium 9.2 8.9 - 10.3 mg/dL  ? Total Protein 7.7 6.5 - 8.1 g/dL  ? Albumin 4.1 3.5 - 5.0 g/dL  ? AST 62 (H) 15 - 41 U/L  ? ALT 100 (H) 0 - 44 U/L  ? Alkaline Phosphatase 62 38 - 126 U/L  ? Total Bilirubin 0.4 0.3 - 1.2 mg/dL  ? GFR, Estimated >60 >60 mL/min  ? Anion gap 6 5 - 15  ?hCG, quantitative, pregnancy  ? Collection Time: 03/24/22  2:37 PM  ?Result Value Ref Range  ? hCG, Beta Chain, Quant, S <1 <5 mIU/mL  ?Rapid HIV screen (HIV 1/2 Ab+Ag)  ? Collection Time: 03/24/22  2:37 PM  ?Result Value Ref Range  ? HIV-1 P24 Antigen -  HIV24 NON REACTIVE NON REACTIVE  ? HIV 1/2 Antibodies NON REACTIVE NON REACTIVE  ? Interpretation (HIV Ag Ab)    ?  A non reactive test result means that HIV 1 or HIV 2 antibodies and HIV 1 p24 antigen were not dete

## 2022-03-26 NOTE — Op Note (Signed)
Preoperative diagnosis:  1.   menorrhagia ?                                        2.  dysmenorrhea  ? ?Postoperative diagnoses: Same as above  ? ?Procedure: Hysteroscopy, endometrial ablation using Minerva ? ?Surgeon: Lazaro Arms  ? ?Anesthesia: Laryngeal mask airway ? ?Findings: The endometrium was normal. There were no fibroid or other abnormalities. ? ?Description of operation: The patient was taken to the operating room and placed in the supine position. She underwent general anesthesia using the laryngeal mask airway. She was placed in the dorsal lithotomy position and prepped and draped in the usual sterile fashion. A Graves speculum was placed and the anterior cervical lip was grasped with a single-tooth tenaculum. The cervix was dilated serially to allow passage of the hysteroscope. Diagnostic hysteroscopy was performed and was found to be normal.  ? ?I then proceeded to perform the Minerva endometrial ablation.   ?The uterus sounded to 8.5 cm ?The handpiece was attached to the Minerva power source/machine and the handpiece passed the checklist. ?The array was squeezed down to remove all of the air present.  ?The array was then place into the endometrial cavity and deployed to a length of 5.5 cm. ?The handpiece confirmed appropriate width by being in the green portion of the visual dial. ?The cervical cuff was then inflated to the point the CO2 indicator was in the green. ?The endometrial integrity check was then performed and integrity sequence was confirmed x 2. ?The heating was then begun and carried out for a total of 2 minutes(which is standard therapy time). ?When the plasma cycle was finished,  the cervical cuff was deflated and the array was removed with tissue present on the silicon membrane. ?There was appropriate post Minerva bleeding and uterine discharge. ? ? ?  All of the equipment worked well throughout the procedure.  The patient was awakened from anesthesia and taken to the recovery room  in good stable condition all counts were correct. She received 2 g of Ancef and 30 mg of Toradol preoperatively. She will be discharged from the recovery room and followed up in the office in 1- 2 weeks.  ? ?She can expect 4 weeks of post procedure bloody watery discharge ? ?Lazaro Arms, MD  ?03/26/2022 ?9:10 AM ? ?Preoperative Diagnosis:  1.  Multiparous female desires permanent sterilization ?                                         2.  Elects to have bilateral salpingectomy for ovarian cancer prophylaxis ? ?Postoperative Diagnosis:  Same as above ? ?Procedure:  Laparoscopic Bilateral Salpingectomy for the purpose of permanent sterilization ? ?Surgeon:  Rockne Coons MD ? ?Anaesthesia: general ? ?Findings:  Patient had normal pelvic anatomy and no intraperitoneal abnormalities. ? ?Description of Operation:  Patient was taken to the OR and placed into supine position where she underwent general anaesthesia.   ?She was placed in the dorsal lithotomy position and prepped and draped in the usual sterile fashion.   ?An incision was made in the umbilicus and dissection taken down to the rectus fascia. ?A Veres needle was used to insufflate the periotneal cavity. ?An 11 mm non bladed video laparoscope trocar was then  placed under direct visualization without difficulty.   ?The above noted findings were observed.   ?Two additional 5 mm non bladed trocars were placed in the right and left lower quadrants under direct visualization without difficulty.   ?The Harmonic scalpel was employed and a salpingectomy of both the right and left tubes was performed.   ?The tubes were removed from the peritoneal cavity and sent to pathology.   ?There was good hemostasis bilaterally.   ?The fascia, peritoneum and subcutaneous tissue were closed using 0 vicryl.   ?All 3 skin incisions were closed using 3-0 vicryl in a subcuticular fashion. ? Exparel 266 mg 20 cc was injected in the 3 incisional/trocar sites.  ?The patient was  awakened from anaesthesia and taken to the PACU with all counts being correct x 3.   ?The patient received  2 gram of ancef andToradol 30 mg IV preoperatively. ? ?Lazaro Arms ?03/26/2022 ?9:11 AM ? ?

## 2022-03-26 NOTE — Transfer of Care (Signed)
Immediate Anesthesia Transfer of Care Note ? ?Patient: Katelyn Avila ? ?Procedure(s) Performed: HYSTEROSCOPY WITH MINERVA (Vagina ) ?LAPAROSCOPIC BILATERAL SALPINGECTOMY (Bilateral: Abdomen) ? ?Patient Location: PACU ? ?Anesthesia Type:General ? ?Level of Consciousness: awake ? ?Airway & Oxygen Therapy: Patient Spontanous Breathing and Patient connected to nasal cannula oxygen ? ?Post-op Assessment: Report given to RN and Post -op Vital signs reviewed and stable ? ?Post vital signs: Reviewed and stable ? ?Last Vitals:  ?Vitals Value Taken Time  ?BP 157/116 03/26/22 0909  ?Temp    ?Pulse 113 03/26/22 0910  ?Resp 21 03/26/22 0910  ?SpO2 91 % 03/26/22 0910  ?Vitals shown include unvalidated device data. ? ?Last Pain:  ?Vitals:  ? 03/26/22 0643  ?TempSrc: Oral  ?PainSc: 0-No pain  ?   ? ?  ? ?Complications: No notable events documented. ?

## 2022-03-26 NOTE — Discharge Instructions (Signed)
REMOVE THE PATCH BEHIND YOUR LEFT EAR ON 03/28/2022 ?NO ADDITIONAL NUMBING MEDS UNTIL AFTER 03/30/2022- YOU HAD A LONG ACTING NUMBING MEDICATION INJECTED INTO INCISION SITES  ?

## 2022-03-27 LAB — SURGICAL PATHOLOGY

## 2022-03-31 ENCOUNTER — Encounter (HOSPITAL_COMMUNITY): Payer: Self-pay | Admitting: Obstetrics & Gynecology

## 2022-04-03 ENCOUNTER — Telehealth (INDEPENDENT_AMBULATORY_CARE_PROVIDER_SITE_OTHER): Payer: BC Managed Care – PPO | Admitting: Obstetrics & Gynecology

## 2022-04-03 DIAGNOSIS — Z9889 Other specified postprocedural states: Secondary | ICD-10-CM

## 2022-04-03 NOTE — Progress Notes (Signed)
Post op Note: ?Telephone visit ? ?Pt is at home  ?I am in my office ? ?Total time 10 minutes ? ? ?HPI: ?Patient returns for routine postoperative follow-up having undergone hysteroscopy D&C Minerva ablation + lap bil salpingectomy 03/26/22.  ?The patient's immediate postoperative recovery has been unremarkable. ?Since hospital discharge the patient reports no problems. ? ? ?Current Outpatient Medications: ?amphetamine-dextroamphetamine (ADDERALL) 30 MG tablet, Take 30 mg by mouth 2 (two) times daily., Disp: , Rfl:  ?buPROPion (WELLBUTRIN XL) 150 MG 24 hr tablet, Take 1 tablet (150 mg total) by mouth daily., Disp: 30 tablet, Rfl: 0 ?busPIRone (BUSPAR) 10 MG tablet, Take 1 tablet (10 mg total) by mouth 3 (three) times daily., Disp: 90 tablet, Rfl: 0 ?lisinopril-hydrochlorothiazide (ZESTORETIC) 20-25 MG tablet, Take 1 tablet by mouth daily., Disp: , Rfl:  ?sertraline (ZOLOFT) 100 MG tablet, Take 1 tablet (100 mg total) by mouth daily., Disp: 30 tablet, Rfl: 0 ?HYDROcodone-acetaminophen (NORCO/VICODIN) 5-325 MG tablet, Take 1 tablet by mouth every 6 (six) hours as needed. (Patient not taking: Reported on 04/03/2022), Disp: 10 tablet, Rfl: 0 ?ketorolac (TORADOL) 10 MG tablet, Take 1 tablet (10 mg total) by mouth every 8 (eight) hours as needed. (Patient not taking: Reported on 04/03/2022), Disp: 15 tablet, Rfl: 0 ?ondansetron (ZOFRAN-ODT) 8 MG disintegrating tablet, Take 1 tablet (8 mg total) by mouth every 8 (eight) hours as needed for nausea or vomiting. (Patient not taking: Reported on 04/03/2022), Disp: 8 tablet, Rfl: 0 ? ?No current facility-administered medications for this visit. ? ? ? ?There were no vitals taken for this visit. ? ?Physical Exam: ?N/a ? ?Diagnostic Tests: ? ? ?Pathology: ?benign ? ?Impression + Management plan: ?  ICD-10-CM   ?1. Post-operative state: Ablation + bil salpingectomy 03/26/22  Z98.890   ? Routine post op coure  ?  ? ? ? ? ? ?Medications Prescribed this encounter: ?No orders of the defined  types were placed in this encounter. ? ? ? ? ?Follow up: ?prn ? ? ?Lazaro Arms, MD ?Attending Physician for the Center for [facility] and ?[facility] ?04/03/2022 ?2:36 PM ? ? ? ? ?

## 2022-04-10 ENCOUNTER — Encounter: Payer: Self-pay | Admitting: Obstetrics & Gynecology

## 2022-04-28 ENCOUNTER — Telehealth: Payer: Self-pay

## 2022-04-28 ENCOUNTER — Encounter: Payer: Self-pay | Admitting: Obstetrics & Gynecology

## 2022-04-28 NOTE — Telephone Encounter (Signed)
PT CALLED AND STATED THAT SHE HAD A PROCEDURE DONE AND THE SITE IS INFECTED AND HAS PUS COMING FROM IT.  WANTS TO KNOW IF SHE CAN HAVE SOMETHING CALLED IN. ?

## 2022-04-28 NOTE — Telephone Encounter (Signed)
LMOVM to see if patient can come tomorrow at 2:30 to see Dr Despina Hidden.  ?

## 2022-04-30 ENCOUNTER — Encounter: Payer: BC Managed Care – PPO | Admitting: Obstetrics & Gynecology

## 2022-09-03 ENCOUNTER — Ambulatory Visit
Admission: RE | Admit: 2022-09-03 | Discharge: 2022-09-03 | Disposition: A | Discharge status: Home / Self Care | Payer: BC Managed Care – PPO | Source: Ambulatory Visit | Attending: Physician Assistant | Admitting: Physician Assistant

## 2022-09-03 ENCOUNTER — Other Ambulatory Visit: Payer: Self-pay | Admitting: Physician Assistant

## 2022-09-03 DIAGNOSIS — R1031 Right lower quadrant pain: Secondary | ICD-10-CM

## 2022-09-03 DIAGNOSIS — R109 Unspecified abdominal pain: Secondary | ICD-10-CM

## 2022-10-19 IMAGING — US US PELVIS COMPLETE WITH TRANSVAGINAL
1 series · 13 of 25 positions shown · non-contrast
Comparison: 12/02/2020

CLINICAL DATA: Menorrhagia, irregular menstrual bleeding. LMP
03/01/2022

EXAM:
TRANSABDOMINAL AND TRANSVAGINAL ULTRASOUND OF PELVIS
TECHNIQUE: Both transabdominal and transvaginal ultrasound examinations of the
pelvis were performed. Transabdominal technique was performed for
global imaging of the pelvis including uterus, ovaries, adnexal
regions, and pelvic cul-de-sac. It was necessary to proceed with
endovaginal exam following the transabdominal exam to visualize the
endometrium and ovaries bilaterally.

[Series 1: us pelvic complete with transvaginal · 13 of 88 slices shown]
[im 1/88]
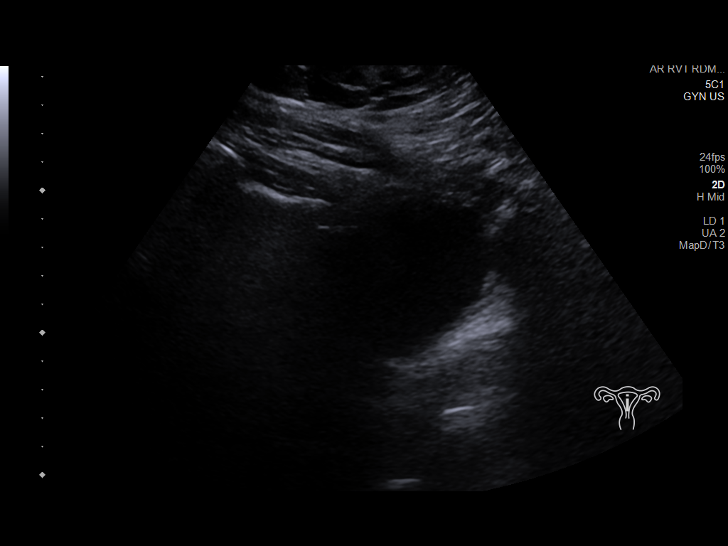
[im 8/88]
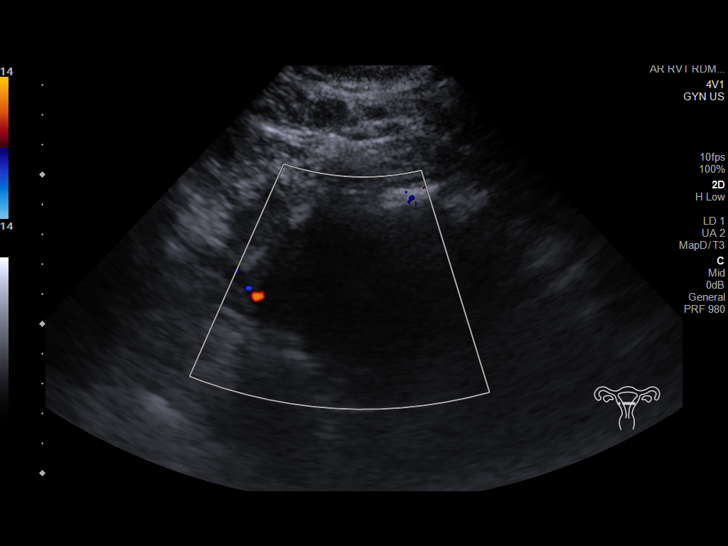
[im 15/88]
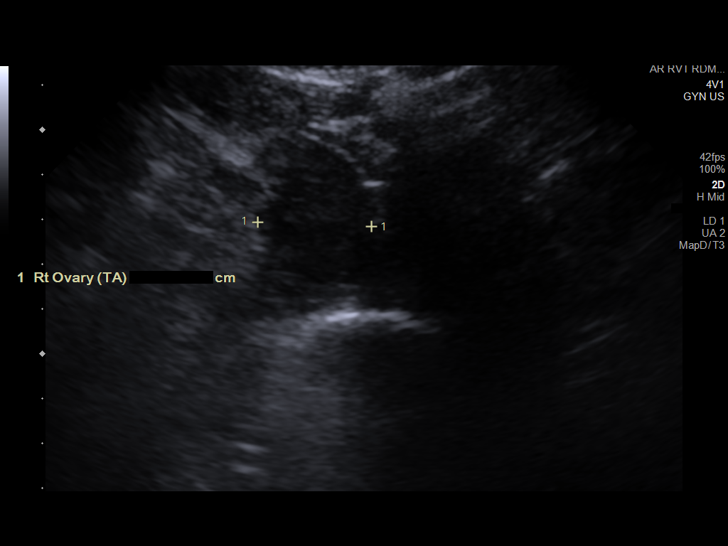
[im 22/88]
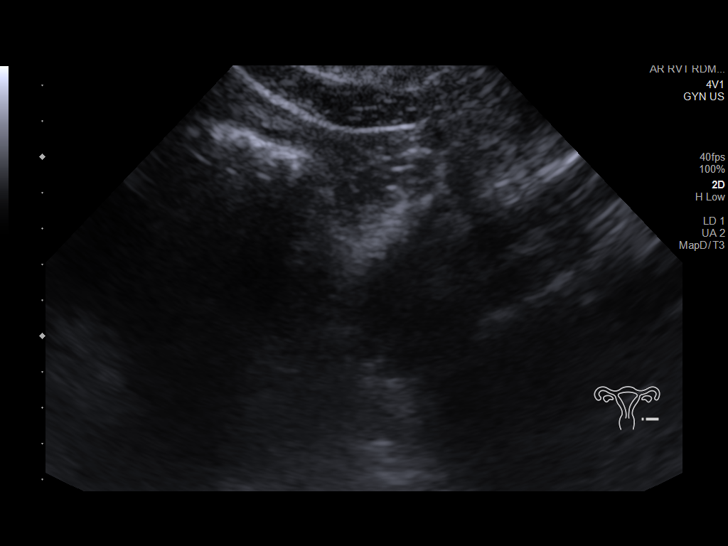
[im 30/88]
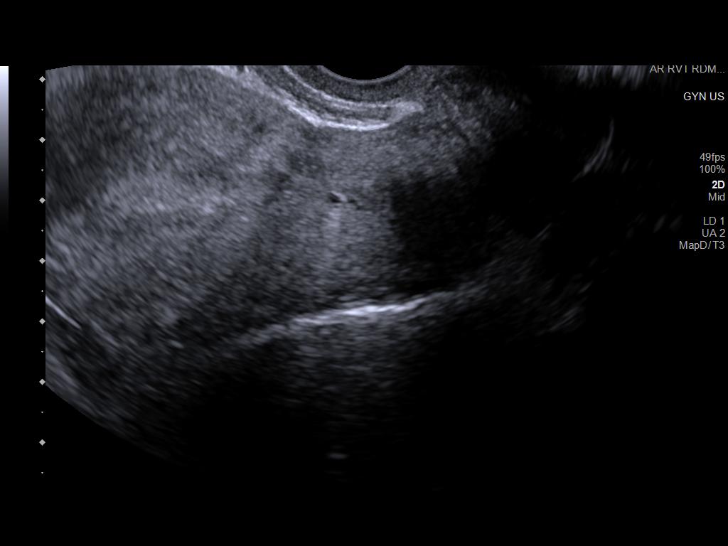
[im 37/88]
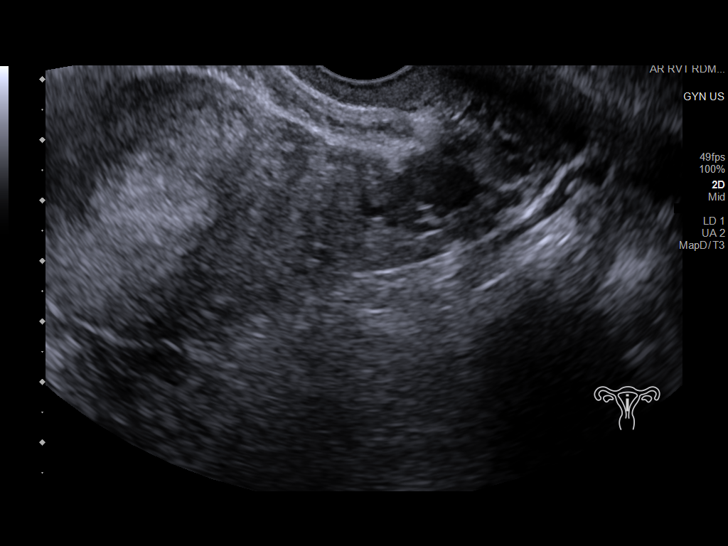
[im 44/88]
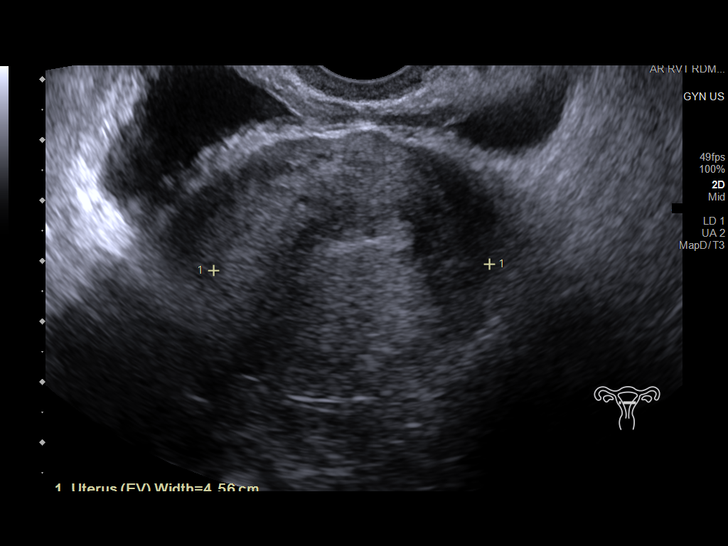
[im 51/88]
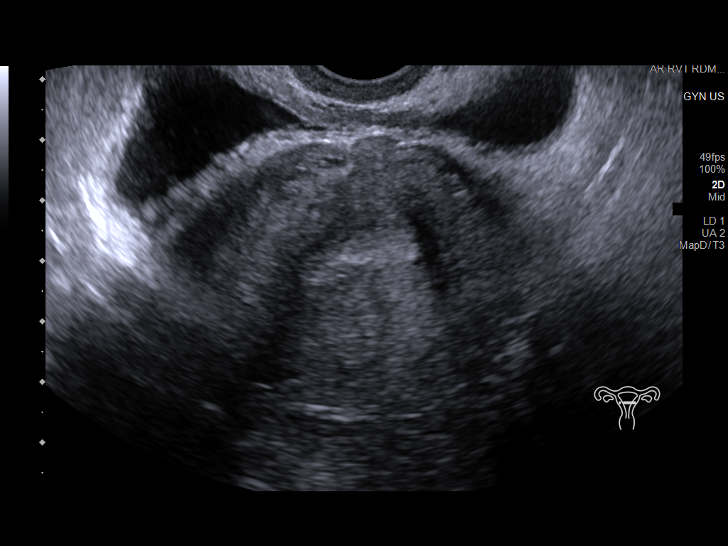
[im 59/88]
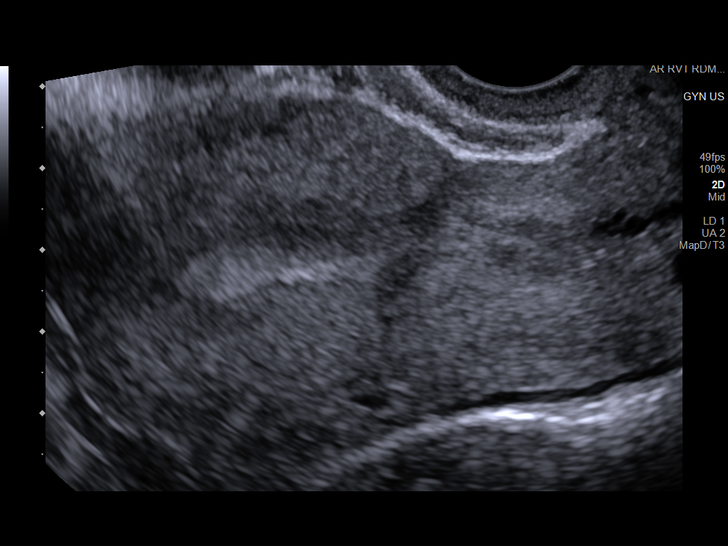
[im 66/88]
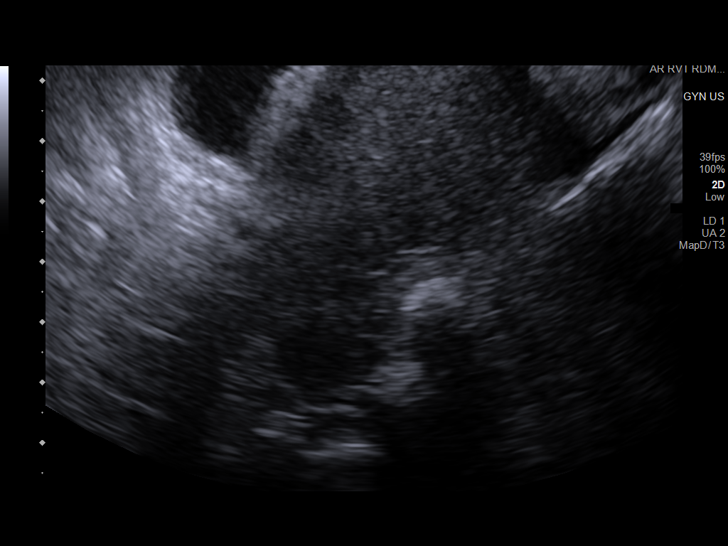
[im 73/88]
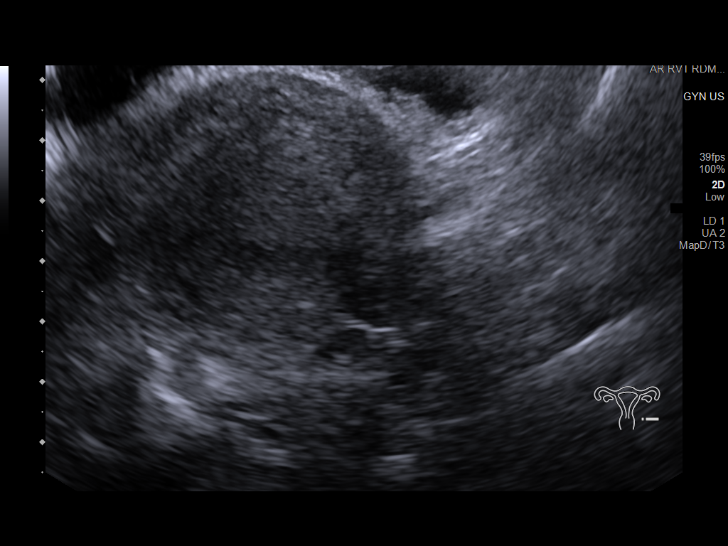
[im 80/88]
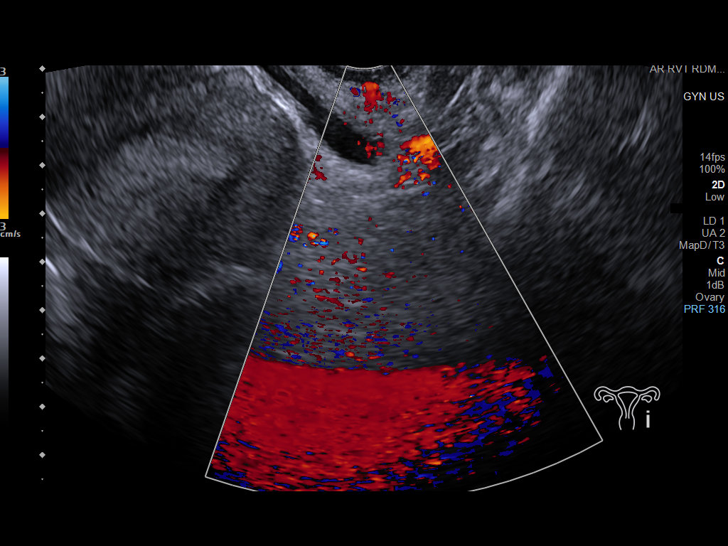
[im 88/88]
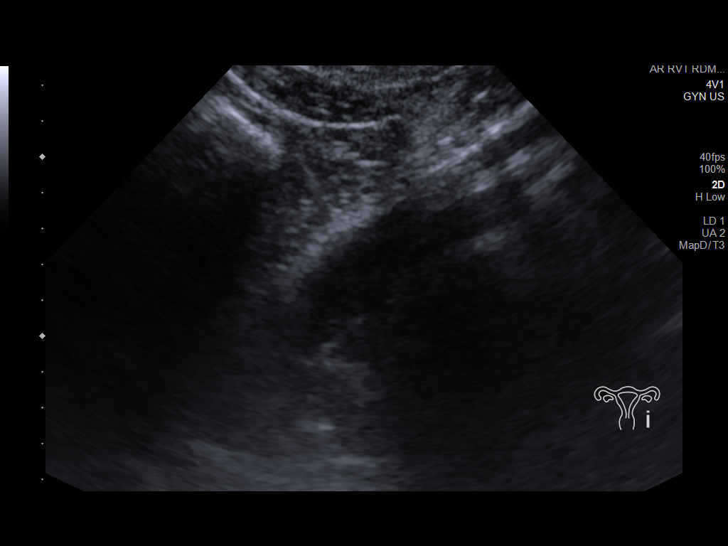

[13 of 25 positions shown; findings below may reference images not displayed]

FINDINGS: Uterus

Measurements: 9.6 x 4.9 x 4.6 cm = volume: 112 mL. The uterus is
anteverted. Cervix is unremarkable save for a few nabothian cysts.
No intrauterine masses are identified. Previously noted intrauterine
device has been removed.

Endometrium

Thickness: 7 mm.  No focal abnormality visualized.

Right ovary

Measurements: 2.3 x 3.3 x 2.6 cm = volume: 10 mL. Normal
appearance/no adnexal mass.

Left ovary

Measurements: 1.8 x 2.0 x 1.7 cm = volume: 3 mL. Normal
appearance/no adnexal mass.

Other findings

Trace simple appearing free fluid is seen within the cul-de-sac
IMPRESSION: Interval removal of intrauterine device.  Normal pelvic sonogram.

## 2022-11-16 ENCOUNTER — Emergency Department (HOSPITAL_COMMUNITY)
Admission: EM | Admit: 2022-11-16 | Discharge: 2022-11-17 | Disposition: A | Discharge status: Home / Self Care | Payer: BC Managed Care – PPO | Attending: Emergency Medicine | Admitting: Emergency Medicine

## 2022-11-16 ENCOUNTER — Encounter (HOSPITAL_COMMUNITY): Payer: Self-pay

## 2022-11-16 ENCOUNTER — Other Ambulatory Visit: Payer: Self-pay

## 2022-11-16 DIAGNOSIS — Z20822 Contact with and (suspected) exposure to covid-19: Secondary | ICD-10-CM | POA: Insufficient documentation

## 2022-11-16 DIAGNOSIS — E119 Type 2 diabetes mellitus without complications: Secondary | ICD-10-CM | POA: Diagnosis not present

## 2022-11-16 DIAGNOSIS — R110 Nausea: Secondary | ICD-10-CM

## 2022-11-16 DIAGNOSIS — R531 Weakness: Secondary | ICD-10-CM | POA: Diagnosis not present

## 2022-11-16 DIAGNOSIS — R112 Nausea with vomiting, unspecified: Secondary | ICD-10-CM | POA: Diagnosis present

## 2022-11-16 DIAGNOSIS — Z7984 Long term (current) use of oral hypoglycemic drugs: Secondary | ICD-10-CM | POA: Diagnosis not present

## 2022-11-16 DIAGNOSIS — R519 Headache, unspecified: Secondary | ICD-10-CM | POA: Insufficient documentation

## 2022-11-16 DIAGNOSIS — Z79899 Other long term (current) drug therapy: Secondary | ICD-10-CM | POA: Insufficient documentation

## 2022-11-16 HISTORY — DX: Type 2 diabetes mellitus without complications: E11.9

## 2022-11-16 LAB — COMPREHENSIVE METABOLIC PANEL
ALT: 78 U/L — ABNORMAL HIGH (ref 0–44)
AST: 41 U/L (ref 15–41)
Albumin: 4.3 g/dL (ref 3.5–5.0)
Alkaline Phosphatase: 56 U/L (ref 38–126)
Anion gap: 10 (ref 5–15)
BUN: 13 mg/dL (ref 6–20)
CO2: 24 mmol/L (ref 22–32)
Calcium: 9.2 mg/dL (ref 8.9–10.3)
Chloride: 100 mmol/L (ref 98–111)
Creatinine, Ser: 0.67 mg/dL (ref 0.44–1.00)
GFR, Estimated: >60 mL/min (ref >60)
Glucose, Bld: 172 mg/dL — ABNORMAL HIGH (ref 70–99)
Potassium: 3.8 mmol/L (ref 3.5–5.1)
Sodium: 134 mmol/L — ABNORMAL LOW (ref 135–145)
Total Bilirubin: 0.4 mg/dL (ref 0.3–1.2)
Total Protein: 7.4 g/dL (ref 6.5–8.1)

## 2022-11-16 LAB — CBC WITH DIFFERENTIAL/PLATELET
Abs Immature Granulocytes: 0.05 10*3/uL (ref 0.00–0.07)
Basophils Absolute: 0 10*3/uL (ref 0.0–0.1)
Basophils Relative: 0 %
Eosinophils Absolute: 0.1 10*3/uL (ref 0.0–0.5)
Eosinophils Relative: 1 %
HCT: 39.7 % (ref 36.0–46.0)
Hemoglobin: 13.4 g/dL (ref 12.0–15.0)
Immature Granulocytes: 1 %
Lymphocytes Relative: 18 %
Lymphs Abs: 1.9 10*3/uL (ref 0.7–4.0)
MCH: 31.2 pg (ref 26.0–34.0)
MCHC: 33.8 g/dL (ref 30.0–36.0)
MCV: 92.5 fL (ref 80.0–100.0)
Monocytes Absolute: 0.5 10*3/uL (ref 0.1–1.0)
Monocytes Relative: 5 %
Neutro Abs: 7.9 10*3/uL — ABNORMAL HIGH (ref 1.7–7.7)
Neutrophils Relative %: 75 %
Platelets: 261 10*3/uL (ref 150–400)
RBC: 4.29 MIL/uL (ref 3.87–5.11)
RDW: 12 % (ref 11.5–15.5)
WBC: 10.5 10*3/uL (ref 4.0–10.5)
nRBC: 0 % (ref 0.0–0.2)

## 2022-11-16 LAB — LIPASE, BLOOD: Lipase: 46 U/L (ref 11–51)

## 2022-11-16 LAB — RESP PANEL BY RT-PCR (FLU A&B, COVID) ARPGX2
Influenza A by PCR: NEGATIVE
Influenza B by PCR: NEGATIVE
SARS Coronavirus 2 by RT PCR: NEGATIVE

## 2022-11-16 LAB — CBG MONITORING, ED: Glucose-Capillary: 125 mg/dL — ABNORMAL HIGH (ref 70–99)

## 2022-11-16 MED ORDER — ONDANSETRON HCL 4 MG/2ML IJ SOLN
4.0000 mg | Freq: Once | INTRAMUSCULAR | Status: AC
  Administered 2022-11-16: 4 mg via INTRAVENOUS
  Filled 2022-11-16: qty 2

## 2022-11-16 MED ORDER — GLIPIZIDE 5 MG PO TABS: 5.0000 mg | ORAL_TABLET | Freq: Two times a day (BID) | ORAL | 0 refills | Status: DC

## 2022-11-16 MED ORDER — SODIUM CHLORIDE 0.9 % IV BOLUS
1000.0000 mL | Freq: Once | INTRAVENOUS | Status: AC
  Administered 2022-11-16: 1000 mL via INTRAVENOUS

## 2022-11-16 MED ORDER — KETOROLAC TROMETHAMINE 30 MG/ML IJ SOLN
30.0000 mg | Freq: Once | INTRAMUSCULAR | Status: AC
  Administered 2022-11-16: 30 mg via INTRAVENOUS
  Filled 2022-11-16: qty 1

## 2022-11-16 NOTE — Discharge Instructions (Addendum)
It is not clear whether you are having a reaction to the metformin medication prescribed for diabetes.  For now, you can take glipizide as an alternative medication for diabetes instead of metformin.  I sent a prescription to your pharmacy.  However you should discuss this with your primary care doctor's office.

## 2022-11-16 NOTE — ED Triage Notes (Signed)
Pt presents to ED from home with c/o nausea and weakness and generally just not feeling good, pt says she was recently dx with diabetes, Dr started her on metformin twice daily but she can only take one b/c it gives her diarrhea and nausea. Pt is worried about sugar level and is scared to eat.

## 2022-11-16 NOTE — ED Provider Notes (Addendum)
[facility] EMERGENCY DEPARTMENT Provider Note   CSN: [id number] Arrival date & time: 11/16/22  2215     History  Chief Complaint  Patient presents with   Nausea    Katelyn Avila is a 36 y.o. female present emergency department with nausea and vomiting.  Patient reports she has not felt well for the past day, feels weak, has a headache, not able to tolerate food.  She is concerned about her blood sugars being "out of control".  She was diagnosed with diabetes recently, says her A1c level was over 8 when it was checked 2 months ago.  She was started on metformin by her PCP at 1000 mg twice a day, has taken only few doses and is concerned that it is making her very sick.  She does have a sick child in the house with URI symptoms.  HPI     Home Medications Prior to Admission medications   Medication Sig Start Date End Date Taking? Authorizing Provider  glipiZIDE (GLUCOTROL) 5 MG tablet Take 1 tablet (5 mg total) by mouth 2 (two) times daily before a meal. 11/16/22 12/16/22 Yes Caydee Talkington, Kermit Balo, MD  amphetamine-dextroamphetamine (ADDERALL) 30 MG tablet Take 30 mg by mouth 2 (two) times daily. 12/18/21   [provider]  buPROPion (WELLBUTRIN XL) 150 MG 24 hr tablet Take 1 tablet (150 mg total) by mouth daily. 05/30/19   Neysa Hotter, MD  busPIRone (BUSPAR) 10 MG tablet Take 1 tablet (10 mg total) by mouth 3 (three) times daily. 05/10/19   Neysa Hotter, MD  HYDROcodone-acetaminophen (NORCO/VICODIN) 5-325 MG tablet Take 1 tablet by mouth every 6 (six) hours as needed. Patient not taking: Reported on 04/03/2022 03/26/22   Lazaro Arms, MD  ketorolac (TORADOL) 10 MG tablet Take 1 tablet (10 mg total) by mouth every 8 (eight) hours as needed. Patient not taking: Reported on 04/03/2022 03/26/22   Lazaro Arms, MD  lisinopril-hydrochlorothiazide (ZESTORETIC) 20-25 MG tablet Take 1 tablet by mouth daily. 12/21/21   [provider]  ondansetron (ZOFRAN-ODT) 8 MG disintegrating  tablet Take 1 tablet (8 mg total) by mouth every 8 (eight) hours as needed for nausea or vomiting. Patient not taking: Reported on 04/03/2022 03/26/22   Lazaro Arms, MD  sertraline (ZOLOFT) 100 MG tablet Take 1 tablet (100 mg total) by mouth daily. 05/30/19   Neysa Hotter, MD      Allergies    Patient has no known allergies.    Review of Systems   Review of Systems  Physical Exam Updated Vital Signs BP (!) 154/104   Pulse 97   Ht 5\' 4"  (1.626 m)   Wt 117.9 kg   LMP 11/16/2022   SpO2 100%   BMI 44.62 kg/m  Physical Exam Constitutional:      General: She is not in acute distress.    Appearance: She is obese.  HENT:     Head: Normocephalic and atraumatic.  Eyes:     Conjunctiva/sclera: Conjunctivae normal.     Pupils: Pupils are equal, round, and reactive to light.  Cardiovascular:     Rate and Rhythm: Normal rate and regular rhythm.  Pulmonary:     Effort: Pulmonary effort is normal. No respiratory distress.  Abdominal:     General: There is no distension.     Tenderness: There is no abdominal tenderness.  Skin:    General: Skin is warm and dry.  Neurological:     General: No focal deficit present.  Mental Status: She is alert. Mental status is at baseline.  Psychiatric:        Mood and Affect: Mood normal.        Behavior: Behavior normal.     ED Results / Procedures / Treatments   Labs (all labs ordered are listed, but only abnormal results are displayed) Labs Reviewed  COMPREHENSIVE METABOLIC PANEL - Abnormal; Notable for the following components:      Result Value   Sodium 134 (*)    Glucose, Bld 172 (*)    ALT 78 (*)    All other components within normal limits  CBC WITH DIFFERENTIAL/PLATELET - Abnormal; Notable for the following components:   Neutro Abs 7.9 (*)    All other components within normal limits  CBG MONITORING, ED - Abnormal; Notable for the following components:   Glucose-Capillary 125 (*)    All other components within normal limits   RESP PANEL BY RT-PCR (FLU A&B, COVID) ARPGX2  LIPASE, BLOOD    EKG None  Radiology No results found.  Procedures Procedures    Medications Ordered in ED Medications  sodium chloride 0.9 % bolus 1,000 mL (1,000 mLs Intravenous New Bag/Given 11/16/22 2308)  ketorolac (TORADOL) 30 MG/ML injection 30 mg (30 mg Intravenous Given 11/16/22 2308)  ondansetron (ZOFRAN) injection 4 mg (4 mg Intravenous Given 11/16/22 2308)    ED Course/ Medical Decision Making/ A&P                           Medical Decision Making Amount and/or Complexity of Data Reviewed Labs: ordered.  Risk Prescription drug management.   Patient is here with generalized weakness, nausea, headache.  Differential would include viral syndrome versus metabolic derangement versus other infection versus blood sugar derangement versus other  We will check for viral illness, as this could also be consistent with a viral illness.  We will also check her abdominal labs, liver enzymes, pancreatic enzymes.  COVID and flu test pending.  Her blood sugar was 125 on arrival, no hypoglycemia.  I have ordered some IV fluids, Toradol, Zofran for hydration, nausea and headache.  I have a low suspicion for acute pancreatitis or acute biliary disease or cholecystitis.  She has no focal tenderness on examination. I do not see indication for emergent CT imaging of abdomen at this time.  Doubt meningitis/SAH - I do not see indication for CTH or LP at this time.  Patient's labs are unremarkable.  She will follow-up on COVID and flu test tomorrow.  I anticipate discharge home likely with conservative management for suspected viral syndrome.  I offered glipizide as a temporary alternative for metformin until she can see her doctor -- it's not clear to me whether she is truly experiencing adverse effects from metformin, or whether this is coincidental timing with another illness making her nauseated.         Final Clinical  Impression(s) / ED Diagnoses Final diagnoses:  Nausea  Nonintractable headache, unspecified chronicity pattern, unspecified headache type    Rx / DC Orders ED Discharge Orders          Ordered    glipiZIDE (GLUCOTROL) 5 MG tablet  2 times daily before meals        11/16/22 2317              Wyvonnia Dusky, MD 11/16/22 2329    Wyvonnia Dusky, MD 11/16/22 [phone]

## 2022-12-02 ENCOUNTER — Ambulatory Visit: Payer: BC Managed Care – PPO | Admitting: Nutrition

## 2022-12-30 ENCOUNTER — Encounter: Payer: BC Managed Care – PPO | Admitting: Nutrition

## 2023-01-20 ENCOUNTER — Ambulatory Visit: Payer: BC Managed Care – PPO | Admitting: Nutrition

## 2024-07-27 ENCOUNTER — Ambulatory Visit: Admitting: Adult Health

## 2024-07-27 ENCOUNTER — Encounter: Payer: Self-pay | Admitting: Adult Health

## 2024-07-27 VITALS — BP 140/94 | HR 116 | Ht 64.0 in | Wt 227.0 lb

## 2024-07-27 DIAGNOSIS — N941 Unspecified dyspareunia: Secondary | ICD-10-CM

## 2024-07-27 DIAGNOSIS — I10 Essential (primary) hypertension: Secondary | ICD-10-CM | POA: Diagnosis not present

## 2024-07-27 DIAGNOSIS — R102 Pelvic and perineal pain: Secondary | ICD-10-CM | POA: Diagnosis not present

## 2024-07-27 DIAGNOSIS — R6882 Decreased libido: Secondary | ICD-10-CM

## 2024-07-27 DIAGNOSIS — N920 Excessive and frequent menstruation with regular cycle: Secondary | ICD-10-CM

## 2024-07-27 DIAGNOSIS — N946 Dysmenorrhea, unspecified: Secondary | ICD-10-CM | POA: Diagnosis not present

## 2024-07-27 MED ORDER — NORETHINDRONE 0.35 MG PO TABS: 1.0000 | ORAL_TABLET | Freq: Every day | ORAL | 3 refills | Status: AC

## 2024-07-27 NOTE — Progress Notes (Signed)
  Subjective:     Patient ID: Katelyn Avila, female   DOB: [birth year], 38 y.o.   MRN: [id number]  HPI Katelyn Avila is a 38 year old white female, married, G2P1102 in complaining of painful sex, pain after orgasm, painful periods,pelvic pain and decreased libido. She has history of ovarian cyst.      Component Value Date/Time   DIAGPAP  01/21/2022 0939    - Negative for intraepithelial lesion or malignancy (NILM)   DIAGPAP  11/26/2017 0000    NEGATIVE FOR INTRAEPITHELIAL LESIONS OR MALIGNANCY.   HPVHIGH Negative 01/21/2022 0939   ADEQPAP  01/21/2022 0939    Satisfactory for evaluation; transformation zone component ABSENT.   ADEQPAP  11/26/2017 0000    Satisfactory for evaluation  endocervical/transformation zone component PRESENT.   PCP is Dr Toribio.  Review of Systems +painful sex,like he hits something + pain after orgasm + painful periods, that are heavy at times  +pelvic pain  +decreased libido Reviewed past medical,surgical, social and family history. Reviewed medications and allergies.     Objective:   Physical Exam BP (!) 140/94 (BP Location: Left Arm, Patient Position: Sitting, Cuff Size: Large)   Pulse (!) 116   Ht 5' 4 (1.626 m)   Wt 227 lb (103 kg)   LMP 06/30/2024 (Exact Date)   BMI 38.96 kg/m     Skin warm and dry.Pelvic: external genitalia is normal in appearance no lesions, vagina: pink,urethra has no lesions or masses noted, cervix:smooth and bulbous, uterus: normal size, shape and contour, + tender, no masses felt, adnexa: no masses, RLQ  tenderness noted. Bladder is non tender and no masses felt.  Fall risk is moderate  Upstream - 07/27/24 1415       Pregnancy Intention Screening   Does the patient want to become pregnant in the next year? No    Does the patient's partner want to become pregnant in the next year? No    Would the patient like to discuss contraceptive options today? No      Contraception Wrap Up   Current Method Female Sterilization    End  Method Female Sterilization    Contraception Counseling Provided No         Examination chaperoned by Clarita Salt LPN  Assessment:     1. Dyspareunia, female (Primary) +pain with sex Pain after orgasm   2. Pelvic pain +pelvic pain, hx ovary cyst Will get US  07/29/24 at [facility] at 1:30 pm to assess uterus and ovaries  - US  PELVIC COMPLETE WITH TRANSVAGINAL; Future  3. Dysmenorrhea +painful periods   4. Hypertension, unspecified type Take BP meds and follow up with PCP  5. Menorrhagia with regular cycle Will try micronor , to see if helps, discussed could have endometriosis and she said her mom had that  Meds ordered this encounter  Medications   norethindrone  (MICRONOR ) 0.35 MG tablet    Sig: Take 1 tablet (0.35 mg total) by mouth daily.    Dispense:  84 tablet    Refill:  3    Supervising Provider:   JAYNE MINDER H [[id number]]   Can start now  6. Decreased libido [employer]DYI    Plan:     Follow up in 8 weeks for ROS, if not better with micronor , may try orlissa or myfembree

## 2024-07-29 ENCOUNTER — Ambulatory Visit (HOSPITAL_COMMUNITY)
Admission: RE | Admit: 2024-07-29 | Discharge: 2024-07-29 | Disposition: A | Discharge status: Home / Self Care | Source: Ambulatory Visit | Attending: Adult Health | Admitting: Adult Health

## 2024-07-29 DIAGNOSIS — R102 Pelvic and perineal pain: Secondary | ICD-10-CM | POA: Diagnosis present

## 2024-08-01 ENCOUNTER — Ambulatory Visit: Payer: Self-pay | Admitting: Adult Health

## 2024-09-21 ENCOUNTER — Ambulatory Visit: Admitting: Adult Health
